# Patient Record
Sex: Female | Born: 1935
Health system: Southern US, Community
[De-identification: ages and names within clinical notes are randomized; demographics above are authoritative.]

## PROBLEM LIST (undated history)

## (undated) DIAGNOSIS — I10 Essential (primary) hypertension: Secondary | ICD-10-CM

## (undated) DIAGNOSIS — I639 Cerebral infarction, unspecified: Secondary | ICD-10-CM

## (undated) DIAGNOSIS — E119 Type 2 diabetes mellitus without complications: Secondary | ICD-10-CM

## (undated) DIAGNOSIS — E785 Hyperlipidemia, unspecified: Secondary | ICD-10-CM

## (undated) DIAGNOSIS — K30 Functional dyspepsia: Secondary | ICD-10-CM

## (undated) DIAGNOSIS — I63422 Cerebral infarction due to embolism of left anterior cerebral artery: Secondary | ICD-10-CM

## (undated) DIAGNOSIS — R55 Syncope and collapse: Secondary | ICD-10-CM

## (undated) DIAGNOSIS — F419 Anxiety disorder, unspecified: Secondary | ICD-10-CM

## (undated) DIAGNOSIS — F0390 Unspecified dementia without behavioral disturbance: Secondary | ICD-10-CM

## (undated) DIAGNOSIS — R42 Dizziness and giddiness: Secondary | ICD-10-CM

## (undated) DIAGNOSIS — R413 Other amnesia: Secondary | ICD-10-CM

## (undated) DIAGNOSIS — J449 Chronic obstructive pulmonary disease, unspecified: Secondary | ICD-10-CM

## (undated) DIAGNOSIS — D649 Anemia, unspecified: Secondary | ICD-10-CM

## (undated) HISTORY — DX: Cerebral infarction, unspecified: I63.9

## (undated) HISTORY — PX: ABDOMINAL HYSTERECTOMY: SHX81

## (undated) HISTORY — DX: Cerebral infarction due to embolism of left anterior cerebral artery: I63.422

## (undated) HISTORY — DX: Type 2 diabetes mellitus without complications: E11.9

## (undated) HISTORY — DX: Hyperlipidemia, unspecified: E78.5

## (undated) HISTORY — DX: Syncope and collapse: R55

## (undated) HISTORY — DX: Dizziness and giddiness: R42

## (undated) HISTORY — DX: Essential (primary) hypertension: I10

## (undated) HISTORY — PX: CAROTID ARTERY - SUBCLAVIAN ARTERY BYPASS GRAFT: SUR178

---

## 2004-11-02 ENCOUNTER — Inpatient Hospital Stay: Payer: Self-pay | Admitting: Internal Medicine

## 2004-11-02 ENCOUNTER — Other Ambulatory Visit: Payer: Self-pay

## 2004-11-03 ENCOUNTER — Other Ambulatory Visit: Payer: Self-pay

## 2006-01-28 ENCOUNTER — Ambulatory Visit: Payer: Self-pay | Admitting: Family Medicine

## 2007-05-05 ENCOUNTER — Ambulatory Visit: Payer: Self-pay | Admitting: Family Medicine

## 2008-05-09 ENCOUNTER — Ambulatory Visit: Payer: Self-pay | Admitting: Family Medicine

## 2009-02-08 ENCOUNTER — Emergency Department: Payer: Self-pay | Admitting: Emergency Medicine

## 2009-02-22 ENCOUNTER — Ambulatory Visit: Payer: Self-pay | Admitting: Physician Assistant

## 2009-05-29 ENCOUNTER — Ambulatory Visit: Payer: Self-pay | Admitting: Family Medicine

## 2010-07-12 ENCOUNTER — Ambulatory Visit: Payer: Self-pay | Admitting: Family Medicine

## 2012-06-09 DIAGNOSIS — F17200 Nicotine dependence, unspecified, uncomplicated: Secondary | ICD-10-CM | POA: Insufficient documentation

## 2012-06-10 DIAGNOSIS — R911 Solitary pulmonary nodule: Secondary | ICD-10-CM | POA: Insufficient documentation

## 2014-02-11 ENCOUNTER — Ambulatory Visit: Payer: Self-pay | Admitting: Hematology and Oncology

## 2014-02-11 DIAGNOSIS — R011 Cardiac murmur, unspecified: Secondary | ICD-10-CM | POA: Insufficient documentation

## 2014-02-11 DIAGNOSIS — R0989 Other specified symptoms and signs involving the circulatory and respiratory systems: Secondary | ICD-10-CM | POA: Insufficient documentation

## 2014-02-11 DIAGNOSIS — R000 Tachycardia, unspecified: Secondary | ICD-10-CM | POA: Insufficient documentation

## 2014-02-11 LAB — CBC CANCER CENTER
BASOS PCT: 1.1 %
Basophil #: 0 x10 3/mm (ref 0.0–0.1)
Eosinophil #: 0 x10 3/mm (ref 0.0–0.7)
Eosinophil %: 0.3 %
HCT: 44.3 % (ref 35.0–47.0)
HGB: 14.4 g/dL (ref 12.0–16.0)
LYMPHS ABS: 1.1 x10 3/mm (ref 1.0–3.6)
Lymphocyte %: 29.6 %
MCH: 29.4 pg (ref 26.0–34.0)
MCHC: 32.5 g/dL (ref 32.0–36.0)
MCV: 91 fL (ref 80–100)
MONO ABS: 0.6 x10 3/mm (ref 0.2–0.9)
MONOS PCT: 15.5 %
Neutrophil #: 2 x10 3/mm (ref 1.4–6.5)
Neutrophil %: 53.5 %
PLATELETS: 198 x10 3/mm (ref 150–440)
RBC: 4.89 10*6/uL (ref 3.80–5.20)
RDW: 13.6 % (ref 11.5–14.5)
WBC: 3.7 x10 3/mm (ref 3.6–11.0)

## 2014-02-11 LAB — IRON AND TIBC
IRON: 53 ug/dL (ref 50–170)
Iron Bind.Cap.(Total): 396 ug/dL (ref 250–450)
Iron Saturation: 13 %
Unbound Iron-Bind.Cap.: 343 ug/dL

## 2014-02-11 LAB — FERRITIN: FERRITIN (ARMC): 32 ng/mL (ref 8–388)

## 2014-02-12 DIAGNOSIS — E0590 Thyrotoxicosis, unspecified without thyrotoxic crisis or storm: Secondary | ICD-10-CM | POA: Insufficient documentation

## 2014-02-12 DIAGNOSIS — D751 Secondary polycythemia: Secondary | ICD-10-CM | POA: Insufficient documentation

## 2014-02-28 ENCOUNTER — Ambulatory Visit: Payer: Self-pay | Admitting: Hematology and Oncology

## 2014-10-02 ENCOUNTER — Emergency Department: Payer: Self-pay | Admitting: Emergency Medicine

## 2014-10-02 LAB — CBC WITH DIFFERENTIAL/PLATELET
BASOS ABS: 0.1 10*3/uL (ref 0.0–0.1)
Basophil %: 1.1 %
Eosinophil #: 0.1 10*3/uL (ref 0.0–0.7)
Eosinophil %: 1.1 %
HCT: 42 % (ref 35.0–47.0)
HGB: 13.7 g/dL (ref 12.0–16.0)
Lymphocyte #: 1.5 10*3/uL (ref 1.0–3.6)
Lymphocyte %: 24.5 %
MCH: 29.3 pg (ref 26.0–34.0)
MCHC: 32.5 g/dL (ref 32.0–36.0)
MCV: 90 fL (ref 80–100)
MONOS PCT: 5.7 %
Monocyte #: 0.3 x10 3/mm (ref 0.2–0.9)
NEUTROS ABS: 4.2 10*3/uL (ref 1.4–6.5)
Neutrophil %: 67.6 %
Platelet: 249 10*3/uL (ref 150–440)
RBC: 4.66 10*6/uL (ref 3.80–5.20)
RDW: 14.4 % (ref 11.5–14.5)
WBC: 6.1 10*3/uL (ref 3.6–11.0)

## 2014-10-02 LAB — URINALYSIS, COMPLETE
BILIRUBIN, UR: NEGATIVE
BLOOD: NEGATIVE
GLUCOSE, UR: NEGATIVE mg/dL (ref 0–75)
Ketone: NEGATIVE
Leukocyte Esterase: NEGATIVE
Nitrite: NEGATIVE
Ph: 5 (ref 4.5–8.0)
Protein: NEGATIVE
RBC,UR: <1 /HPF (ref 0–5)
SPECIFIC GRAVITY: 1.017 (ref 1.003–1.030)
Squamous Epithelial: 3
WBC UR: 2 /HPF (ref 0–5)

## 2014-10-02 LAB — BASIC METABOLIC PANEL
Anion Gap: 7 (ref 7–16)
BUN: 16 mg/dL (ref 7–18)
CALCIUM: 9.1 mg/dL (ref 8.5–10.1)
CO2: 28 mmol/L (ref 21–32)
Chloride: 104 mmol/L (ref 98–107)
Creatinine: 1.09 mg/dL (ref 0.60–1.30)
EGFR (Non-African Amer.): 52 — ABNORMAL LOW
Glucose: 133 mg/dL — ABNORMAL HIGH (ref 65–99)
Osmolality: 281 (ref 275–301)
Potassium: 3.5 mmol/L (ref 3.5–5.1)
Sodium: 139 mmol/L (ref 136–145)

## 2014-10-02 LAB — TROPONIN I: Troponin-I: <0.02 ng/mL

## 2014-12-19 ENCOUNTER — Observation Stay: Payer: Self-pay | Admitting: Internal Medicine

## 2014-12-20 DIAGNOSIS — I340 Nonrheumatic mitral (valve) insufficiency: Secondary | ICD-10-CM

## 2014-12-29 ENCOUNTER — Ambulatory Visit
Admit: 2014-12-29 | Disposition: A | Discharge status: Home / Self Care | Payer: Self-pay | Attending: Otolaryngology | Admitting: Otolaryngology

## 2015-01-03 DIAGNOSIS — F17290 Nicotine dependence, other tobacco product, uncomplicated: Secondary | ICD-10-CM | POA: Diagnosis not present

## 2015-01-03 DIAGNOSIS — I10 Essential (primary) hypertension: Secondary | ICD-10-CM | POA: Diagnosis not present

## 2015-01-03 DIAGNOSIS — I6523 Occlusion and stenosis of bilateral carotid arteries: Secondary | ICD-10-CM | POA: Diagnosis not present

## 2015-01-03 DIAGNOSIS — J432 Centrilobular emphysema: Secondary | ICD-10-CM | POA: Diagnosis not present

## 2015-01-03 DIAGNOSIS — R42 Dizziness and giddiness: Secondary | ICD-10-CM | POA: Diagnosis not present

## 2015-01-05 DIAGNOSIS — I6529 Occlusion and stenosis of unspecified carotid artery: Secondary | ICD-10-CM | POA: Diagnosis not present

## 2015-01-05 DIAGNOSIS — I10 Essential (primary) hypertension: Secondary | ICD-10-CM | POA: Diagnosis not present

## 2015-01-05 DIAGNOSIS — I739 Peripheral vascular disease, unspecified: Secondary | ICD-10-CM | POA: Diagnosis not present

## 2015-01-05 DIAGNOSIS — R279 Unspecified lack of coordination: Secondary | ICD-10-CM | POA: Diagnosis not present

## 2015-01-05 DIAGNOSIS — E785 Hyperlipidemia, unspecified: Secondary | ICD-10-CM | POA: Diagnosis not present

## 2015-01-11 DIAGNOSIS — Z01818 Encounter for other preprocedural examination: Secondary | ICD-10-CM | POA: Diagnosis not present

## 2015-01-11 DIAGNOSIS — I10 Essential (primary) hypertension: Secondary | ICD-10-CM | POA: Diagnosis not present

## 2015-01-11 DIAGNOSIS — E780 Pure hypercholesterolemia: Secondary | ICD-10-CM | POA: Diagnosis not present

## 2015-01-11 DIAGNOSIS — R011 Cardiac murmur, unspecified: Secondary | ICD-10-CM | POA: Diagnosis not present

## 2015-01-20 DIAGNOSIS — R011 Cardiac murmur, unspecified: Secondary | ICD-10-CM | POA: Diagnosis not present

## 2015-01-23 LAB — SURGICAL PATHOLOGY

## 2015-01-24 DIAGNOSIS — R0789 Other chest pain: Secondary | ICD-10-CM | POA: Diagnosis not present

## 2015-01-24 DIAGNOSIS — Z01818 Encounter for other preprocedural examination: Secondary | ICD-10-CM | POA: Diagnosis not present

## 2015-01-29 NOTE — Discharge Summary (Signed)
PATIENT NAME:  Jasmine Arias, Jasmine Arias MR#:  [id number] DATE OF BIRTH:  Dec 24, 1934  DATE OF ADMISSION:  12/19/2014 DATE OF DISCHARGE:  12/20/2014  ADMITTING DIAGNOSIS: Vertigo.  DISCHARGE DIAGNOSES:  1.  Malignant essential hypertension.  2.  Nausea, vomiting, resolved.  3.  Cardiac murmur, benign.  4.  Bilateral carotid artery stenosis.  5.  Syncope. 6.  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   7.  History of bipolar disorder, hypertension, hyperlipidemia, diabetes mellitus, dementia, as well as ongoing tobacco abuse.   DISCHARGE CONDITION: Stable.   DISCHARGE MEDICATIONS: The patient is to continue:  1.  Atorvastatin 10 mg p.o. daily at bedtime.  2.  Glimepiride 2 mg p.o. daily.  3.  Sertraline 50 mg p.o. daily. 4.  Bupropion extended release 150 mg twice daily.  5.  HCTZ/triamterene 25/37.5 mg once daily.  6.  Aspirin 325 mg p.o. daily, this a new medication.  7.  Magnesium oxide 400 mg twice daily.  8.  Omeprazole 20 mg p.o. daily.  9.  Nicotine transdermal patch 21 mg topically daily.   The patient is not to take metformin for the next 48 hours after CTA, which was performed on 12/25/2014.   HOME OXYCONTIN: None.   DIET: Two gram salt, low-fat, low-cholesterol, carbohydrate -controlled diet, regular consistency.   ACTIVITY LIMITATIONS: As tolerated.    FOLLOWUP APPOINTMENT: With ENT MD as soon as possible for left parotid tumor evaluation. Dr. Delana Meyer in 2 days after discharge, Dr. Calla Kicks in 2 days after discharge.   CONSULTANTS: Care management, social work, Dr. Delana Meyer.   RADIOLOGIC STUDIES: Chest x-ray, portable single view, on 12/19/2014, showed no active disease. CT head without contrast, 12/19/2014, showed atrophy and small vessel chronic ischemic changes of deep cerebral white matter but no acute intracranial abnormalities noted. CTA angiography of the carotid arteries, 12/20/2014, revealed extensive atherosclerotic disease, throughout the neck, 80% stenosis in the  distal left common carotid and proximal left internal carotid arteries, 60% stenosis of the distal right common carotid and proximal internal carotid artery, mild to moderate right vertebral artery V2 stenosis, approximately 50% stenosis of the brachiocephalic in the right subclavian artery origin, and 65% left subclavian artery origin stenosis, 4.6 cm left parotid mass compatible with primary parotid neoplasm, 5 mm right parotid nodule. Carotid ultrasound, 12/25/2014, revealing bilateral carotid bifurcation and proximal RCA plaque resulting in 50%-69% diameter right ICA stenosis, 70%-99% diameter left ICA stenosis. Consider vascular, surgical, or interventional radiology consultation. Echocardiogram, 12/21/2014, showed left ventricular ejection fraction by visual estimation 60%-65%, normal global left ventricular systolic function, impaired relaxation pattern of left ventricular diastolic filling, mild left ventricular hypertrophy, normal right ventricular size and systolic function, mild mitral valve regurgitation, mild to moderate tricuspid regurgitation, mildly elevated pulmonary arterial systolic pressure.   HOSPITAL COURSE: The patient is a 80 year old African American female with past medical history significant for history of diabetes, hypertension, hyperlipidemia, who presents to the hospital with dizziness as well as presyncope. Apparently, the patient was having problems with nausea, vomiting, multiple bouts of vomiting and came to the hospital with dizziness as well as feeling presyncopal. She went to see her primary care physician and tried to stand up and felt presyncopal, went down to the floor, but did not completely pass out. She did not hit her head, but felt very weak and was sent Emergency Room for further evaluation. In the Emergency Room, she continued to have nausea but no vomiting. Orthostatic vital signs were checked, were found to be positive  and she was admitted to the hospital for  further evaluation. In the Emergency Room, her temperature was 97.6, pulse was 88, respiration rate was 18-20, blood pressure 195/96, saturation 100% on room air. Physical exam revealed cardiac murmur also carotid murmur and the patient was admitted to the hospital for further evaluation. Her labs done in the Emergency Room showed a glucose level of 244, otherwise BMP was unremarkable. The patient's magnesium level was found to be low at 1.5. Liver enzymes were normal. Troponin was less than 0.03. CBC within normal limits with white blood cell count of 10.2, hemoglobin 13.9 platelet count was 204,000. Urinalysis showed straw clear urine, 150 mg/dL glucose, negative for bilirubin or ketones, specific gravity was 1.012, pH was 7.0, negative for blood, 30 mg/dL protein, negative for nitrites or leukocyte esterase, 2 red blood cells, 1 white blood cell, no bacteria, less than 1 epithelial cell. EKG showed sinus rhythm at 85 beats per minute, sinus arrhythmia with frequent premature ventricular complexes, possible left atrial enlargement according to EKG criteria and no acute ST-T changes were noted. The patient was admitted to the hospital for further evaluation because of her presyncopal episodes and concerns of accelerated hypertension. She was rehydrated and a carotid ultrasound as well as echocardiogram were ordered. Carotid ultrasound revealed significant carotid artery disease. At this point, the patient underwent CTA of her neck, which showed extensive carotid artery disease as well as brachiocephalic and left subclavian artery stenosis and left parotid mass of approximately 4.6 cm. Because of extensive artery disease, the patient was seen by Dr. Delana Meyer on 12/20/2014. Dr. Delana Meyer felt that the patient does have critical stenosis in left ICA; however, he felt that she is aq poor stent candidate given the likely primary parotid malignancy. Because of that, he recommended to evaluate the patient by ENT so  appropriate timing can be made for evaluation. The patient was initiated on aspirin and recommended to follow up with Dr. Delana Meyer as well as ENT physician as soon as possible. She was ambulated while she was in the hospital and recommended to send her home no home health physical therapy recommendations were made by physical therapist.  In regard to nausea and vomiting, the patient was rehydrated, continued on antiemetics and she had no further episodes of nausea, vomiting.   In regard to cardiac murmur, echocardiogram was performed and did not reveal significant stenosis, although the patient was shown to have mild mitral valve regurgitation as well as mild to moderate tricuspid as well as mild LVH as well as impaired relaxation pattern of left ventricular diastolic filling, but normal ejection fraction. No further recommendations in regard to cardiac evaluation where made.   For tobacco abuse, the patient was counseled and recommended to quit. Nicotine replacement therapy was initiated for her upon discharge.   For her chronic medical problems such as bipolar disorder, hypertension, hyperlipidemia, diabetes mellitus, dementia, the patient is to continue her outpatient medications except of metformin. The patient's metformin suspended for 48 hours after CTA, which was performed on 12/20/2014.   The patient is being discharged in stable condition with the above-mentioned medications and followup. On the day of discharge, temperature was 98.0, pulse was 86, respiration rate was 18, blood pressure ranging from 242-683 systolic and 41D diastolic, oxygen saturations were 94%-96% on room air at rest.   TIME SPENT: 40 minutes.    ____________________________ Theodoro Grist, MD rv:bm D: 12/20/2014 17:58:17 ET T: 12/21/2014 05:05:01 ET JOB#: [id number]  cc: Theodoro Grist, MD, <Dictator> ENT  MD Katha Cabal, MD Dory Horn. Eliberto Ivory, MD  Theodoro Grist MD ELECTRONICALLY SIGNED 01/08/2015 14:34

## 2015-01-29 NOTE — Consult Note (Signed)
Brief Consult Note: Diagnosis: left ICA critical stenosis,.   Recommend further assessment or treatment.   Comments: Patient does have critical stenosis of the left ICA however she is a poor stent candidate and given the finding of a primary parotid malignancy open surgical repair will be very complicated.  Patient needs evaluation by ENT so that appropriate planning can be made.  Electronic Signatures: Hortencia Pilar (MD)  (Signed 22-Mar-16 17:28)  Authored: Brief Consult Note   Last Updated: 22-Mar-16 17:28 by Hortencia Pilar (MD)

## 2015-01-29 NOTE — H&P (Signed)
PATIENT NAME:  Jasmine Arias, Jasmine Arias MR#:  [id number] DATE OF BIRTH:  1935-09-16  DATE OF ADMISSION:  12/19/2014  PRIMARY CARE PROVIDER: Dr. Wynetta Emery   CHIEF COMPLAINT: Dizziness and presyncope.   HISTORY OF PRESENTING ILLNESS: A 80 year old, African American female patient with history of hypertension, diabetes, dementia, bipolar disorder was feeling well earlier today. But around 2:30 p.m. she started having some nausea and multiple bouts of vomiting. No blood. No abdominal pain. No diarrhea. She had a normal bowel movement earlier today. She went to see her primary care physician where she tried to stand up, had an episode of presyncope and went down to the floor but did not pass out, did not hit her head, felt extremely weak and was sent to the Emergency Room thru EMS. Here patient continued to have nausea but no vomiting. But considering her dehydration, orthostatic vitals being positive, ongoing dizziness, and dehydration is being admitted to the hospitalist service. The patient also had elevated blood pressure into the 180s.   History has been obtained from the patient and family at bedside.   The patient has not been eating or drinking well over the past few months according to her niece and sister at bedside and has been noncompliant with medications. She normally ambulates on her own. Does not have any dizziness, nausea. The last medication that was started was bupropion 2 months prior. Here, patient had a CT scan of the head, which showed nothing acute. Chest x-ray is normal.   PAST MEDICAL HISTORY:  1.  Bipolar disorder.  2.  Dementia.  3.  Hypertension.  4.  Diabetes mellitus.  5.  Total hysterectomy.  6.  Hyperlipidemia.   FAMILY HISTORY: CVA, hypertension.   SOCIAL HISTORY: The patient continues to smoke. No alcohol. No illicit drugs. Lives at home with her husband.   ALLERGIES: No known drug allergies.   REVIEW OF SYSTEMS:  CONSTITUTIONAL: Complains of fatigue, weakness.  EYES:  No blurred vision, pain, redness.  EARS, NOSE, AND THROAT: No tinnitus, ear pain, hearing loss.  RESPIRATORY: No cough, wheeze, hemoptysis.  CARDIOVASCULAR: No chest pain, orthopnea, edema.  GASTROINTESTINAL: Has nausea, vomiting earlier today, which has resolved. No abdominal pain, diarrhea.  GENITOURINARY: No dysuria, hematuria, frequency.  ENDOCRINE: No polyuria, nocturia, thyroid problems.  HEMATOLOGIC AND LYMPHATIC: No anemia, easy bruising.  INTEGUMENTARY: No acne, rash, lesion.  MUSCULOSKELETAL: No back pain, arthritis.  NEUROLOGICAL: Has dizziness.  PSYCHIATRIC: Has bipolar disorder.   HOME MEDICATIONS:  1.  Metformin 500 mg 2 times a day.  2.  Sertraline 50 mg oral daily at bedtime.  3.  Bupropion 150 mg 2 times a day.  4.  Glimepiride 2 mg in the morning.  5.  Hydrochlorothiazide/triamterene 25/37.5 mg oral once a day.  6.  Atorvastatin 20 mg daily.   PHYSICAL EXAMINATION:  VITAL SIGNS: Temperature 97.6, pulse of 88, blood pressure 195/96, saturating 100% on room air.  GENERAL: Moderately built [place] American female patient lying in bed in distress secondary to her ongoing dizziness.  PSYCHIATRIC: Alert and oriented x 3, anxious.  HEENT: Atraumatic. Normocephalic. Oral mucosa dry and pink.  no pallor, no icterus.  NECK: Supple. No thyromegaly. No palpable lymph nodes. Trachea midline. No carotid bruit or JVD.  CARDIOVASCULAR: S1, S2, without any murmurs. Peripheral pulses 2+. No edema.  RESPIRATORY: Normal work of breathing. Clear to auscultation on both sides.  GASTROINTESTINAL: Soft abdomen, nontender. Bowel sounds present. No organomegaly palpable.  SKIN: Warm and dry. No petechiae, rash, ulcer.  MUSCULOSKELETAL: No joint swelling, redness, effusion of the large joints. Normal muscle tone.  NEUROLOGICAL: Motor strength 5/5 in upper and lower extremities. Sensation to fine touch intact all over. Cranial nerves II through XII intact . No nystagmus. Finger-nose test  normal. Gait not tested as patient got dizzy on sitting up.    LABORATORY DATA: EKG shows normal sinus rhythm with frequent PVCs.   CT scan of the head shows some mild atrophy, nothing acute.   Chest x-ray shows nothing acute.   ASSESSMENT AND PLAN:  1.  Vertigo.  The patient does not  have any neurological deficits. CT scan does not show any stroke. At this point, the patient is severely dehydrated due to her vomiting, decreased intake. We will start her on IV fluids and give her normal saline 1000 mL bolus to start. We will start her on scheduled meclizine. We will also have physical therapy see the patient for her vertigo symptoms. If patient's vertigo does not improve, she may need an MRI of the brain to rule out posterior circulation stroke due to her noncompliance with medications, uncontrolled diabetes and hypertension. Further management as per patient's progress on conservative treatment.  2.  Accelerated hypertension due to noncompliance with medications. We will use IV p.r.n. medications along with restarting her home medications and trend the blood pressure.  3.  Diabetes mellitus type 2. Put her on sliding scale insulin and restart her glimepiride, which she takes in the morning and metformin.  4.  Tobacco abuse. Counseled the patient to quit smoking for greater than 3 minutes.  5.  Early dementia. Watch for any inpatient delirium.  6.  Deep vein thrombosis prophylaxis with Lovenox.   CODE STATUS: Full code.   TIME SPENT ON THIS CASE: 45 minutes.    ____________________________ Leia Alf Kaiyah Eber, MD srs:AT D: 12/19/2014 23:12:38 ET T: 12/19/2014 23:26:36 ET JOB#: [id number]  cc: Alveta Heimlich R. Marlon Suleiman, MD, <Dictator> Neita Carp MD ELECTRONICALLY SIGNED 12/20/2014 2:25

## 2015-02-08 DIAGNOSIS — R011 Cardiac murmur, unspecified: Secondary | ICD-10-CM | POA: Diagnosis not present

## 2015-02-08 DIAGNOSIS — E780 Pure hypercholesterolemia: Secondary | ICD-10-CM | POA: Diagnosis not present

## 2015-02-08 DIAGNOSIS — I10 Essential (primary) hypertension: Secondary | ICD-10-CM | POA: Diagnosis not present

## 2015-02-08 DIAGNOSIS — R0989 Other specified symptoms and signs involving the circulatory and respiratory systems: Secondary | ICD-10-CM | POA: Diagnosis not present

## 2015-03-02 DIAGNOSIS — I6529 Occlusion and stenosis of unspecified carotid artery: Secondary | ICD-10-CM | POA: Diagnosis not present

## 2015-03-02 DIAGNOSIS — I10 Essential (primary) hypertension: Secondary | ICD-10-CM | POA: Diagnosis not present

## 2015-03-02 DIAGNOSIS — E785 Hyperlipidemia, unspecified: Secondary | ICD-10-CM | POA: Diagnosis not present

## 2015-03-09 ENCOUNTER — Inpatient Hospital Stay: Admission: RE | Admit: 2015-03-09 | Payer: Medicare Other | Source: Ambulatory Visit

## 2015-03-17 ENCOUNTER — Inpatient Hospital Stay: Admit: 2015-03-17 | Payer: Self-pay | Admitting: Vascular Surgery

## 2015-03-17 SURGERY — ENDARTERECTOMY, CAROTID
Anesthesia: Choice | Laterality: Left

## 2015-05-11 ENCOUNTER — Other Ambulatory Visit: Payer: Self-pay | Admitting: Family Medicine

## 2015-05-11 ENCOUNTER — Encounter: Payer: Self-pay | Admitting: Family Medicine

## 2015-05-11 ENCOUNTER — Ambulatory Visit (INDEPENDENT_AMBULATORY_CARE_PROVIDER_SITE_OTHER): Payer: Medicare Other | Admitting: Family Medicine

## 2015-05-11 VITALS — BP 120/77 | HR 102 | Temp 98.5°F | Ht 60.4 in | Wt 111.8 lb

## 2015-05-11 DIAGNOSIS — E042 Nontoxic multinodular goiter: Secondary | ICD-10-CM | POA: Diagnosis not present

## 2015-05-11 DIAGNOSIS — I6522 Occlusion and stenosis of left carotid artery: Secondary | ICD-10-CM

## 2015-05-11 DIAGNOSIS — E113299 Type 2 diabetes mellitus with mild nonproliferative diabetic retinopathy without macular edema, unspecified eye: Secondary | ICD-10-CM | POA: Insufficient documentation

## 2015-05-11 DIAGNOSIS — E119 Type 2 diabetes mellitus without complications: Secondary | ICD-10-CM

## 2015-05-11 DIAGNOSIS — E46 Unspecified protein-calorie malnutrition: Secondary | ICD-10-CM

## 2015-05-11 DIAGNOSIS — F0390 Unspecified dementia without behavioral disturbance: Secondary | ICD-10-CM | POA: Diagnosis not present

## 2015-05-11 DIAGNOSIS — E11319 Type 2 diabetes mellitus with unspecified diabetic retinopathy without macular edema: Secondary | ICD-10-CM | POA: Diagnosis not present

## 2015-05-11 DIAGNOSIS — I6529 Occlusion and stenosis of unspecified carotid artery: Secondary | ICD-10-CM | POA: Insufficient documentation

## 2015-05-11 DIAGNOSIS — R42 Dizziness and giddiness: Secondary | ICD-10-CM

## 2015-05-11 DIAGNOSIS — I10 Essential (primary) hypertension: Secondary | ICD-10-CM

## 2015-05-11 DIAGNOSIS — E785 Hyperlipidemia, unspecified: Secondary | ICD-10-CM

## 2015-05-11 DIAGNOSIS — I700 Atherosclerosis of aorta: Secondary | ICD-10-CM | POA: Insufficient documentation

## 2015-05-11 HISTORY — DX: Dizziness and giddiness: R42

## 2015-05-11 LAB — MICROALBUMIN, URINE WAIVED
Creatinine, Urine Waived: 200 mg/dL (ref 10–300)
MICROALB, UR WAIVED: 30 mg/L — AB (ref 0–19)
Microalb/Creat Ratio: <30 mg/g (ref <30)

## 2015-05-11 LAB — CBC WITH DIFFERENTIAL/PLATELET
HEMATOCRIT: 43.6 % (ref 34.0–46.6)
HEMOGLOBIN: 14.7 g/dL (ref 11.1–15.9)
LYMPHS: 28 %
Lymphocytes Absolute: 1.9 10*3/uL (ref 0.7–3.1)
MCH: 30.6 pg (ref 26.6–33.0)
MCHC: 33.7 g/dL (ref 31.5–35.7)
MCV: 91 fL (ref 79–97)
MID (Absolute): 0.5 10*3/uL (ref 0.1–1.6)
MID: 7 %
Neutrophils Absolute: 4.5 10*3/uL (ref 1.4–7.0)
Neutrophils: 65 %
Platelets: 236 10*3/uL (ref 150–379)
RBC: 4.81 x10E6/uL (ref 3.77–5.28)
RDW: 13.8 % (ref 12.3–15.4)
WBC: 6.9 10*3/uL (ref 3.4–10.8)

## 2015-05-11 LAB — UA/M W/RFLX CULTURE, ROUTINE
Bilirubin, UA: NEGATIVE
Glucose, UA: NEGATIVE
Ketones, UA: NEGATIVE
LEUKOCYTES UA: NEGATIVE
NITRITE UA: NEGATIVE
Protein, UA: NEGATIVE
RBC, UA: NEGATIVE
Specific Gravity, UA: 1.015 (ref 1.005–1.030)
UUROB: 1 mg/dL (ref 0.2–1.0)
pH, UA: 5.5 (ref 5.0–7.5)

## 2015-05-11 LAB — BAYER DCA HB A1C WAIVED: HB A1C (BAYER DCA - WAIVED): 6.4 % (ref <7.0)

## 2015-05-11 LAB — LIPID PANEL PICCOLO, WAIVED
CHOL/HDL RATIO PICCOLO,WAIVE: 2.8 mg/dL
CHOLESTEROL PICCOLO, WAIVED: 144 mg/dL (ref <200)
HDL CHOL PICCOLO, WAIVED: 52 mg/dL — AB (ref >59)
LDL CHOL CALC PICCOLO WAIVED: 79 mg/dL (ref <100)
Triglycerides Piccolo,Waived: 69 mg/dL (ref <150)
VLDL Chol Calc Piccolo,Waive: 14 mg/dL (ref <30)

## 2015-05-11 MED ORDER — DONEPEZIL HCL 10 MG PO TABS: 10.0000 mg | ORAL_TABLET | Freq: Every day | ORAL | Status: DC

## 2015-05-11 MED ORDER — SERTRALINE HCL 50 MG PO TABS: 50.0000 mg | ORAL_TABLET | Freq: Every day | ORAL | Status: DC

## 2015-05-11 MED ORDER — ENSURE NUTRITION SHAKE PO LIQD: 8.0000 [oz_av] | Freq: Two times a day (BID) | ORAL | Status: DC

## 2015-05-11 NOTE — Assessment & Plan Note (Signed)
MMSE has gotten significantly worse since her last one. In December at her previous doctor, her MMSE was a 23/30. Today, MMSE was 14.5/30. Discussed with patient. And her husband that this seems like it's getting worse. Will start zoloft again in case it's pseudo-dementia. Will start aricept. Will have her follow up in 1 month. Will have her see neurology- referral to Dr. Manuella Ghazi and appointment made today.

## 2015-05-11 NOTE — Assessment & Plan Note (Signed)
Rechecking TSH today, was subclinical hyperthyroid previously. Await results.

## 2015-05-11 NOTE — Patient Instructions (Addendum)
Appointment with the neurologist:  [facility]  [place]  [place] [phone] Dr. Manuella Ghazi June 30, 2015 11AM  Dementia Dementia is a word that is used to describe problems with the brain and how it works. People with dementia have memory loss. They may also have problems with thinking, speaking, or solving problems. It can affect how they act around people, how they do their job, their mood, and their personality. These changes may not show up for a long time. Family or friends may not notice problems in the early part of this disease. HOME CARE The following tips are for the person living with, or caring for, the person with dementia. Make the home safe.  Remove locks on bathroom doors.  Use childproof locks on cabinets where alcohol, cleaning supplies, or chemicals are stored.  Put outlet covers in electrical outlets.  Put in childproof locks to keep doors and windows safe.  Remove stove knobs, or put in safety knobs that shut off on their own.  Lower the temperature on water heaters.  Label medicines. Lock them in a safe place.  Keep knives, lighters, matches, power tools, and guns out of reach or in a safe place.  Remove objects that might break or can hurt the person.  Make sure lighting is good inside and outside.  Put in grab bars if needed.  Use a device that detects falls or other needs for help. Lessen confusion.  Keep familiar objects and people around.  Use night lights or low lit (dim) lights at night.  Label objects or areas.  Use reminders, notes, or directions for daily activities or tasks.  Keep a simple routine that is the same for waking, meals, bathing, dressing, and bedtime.  Create a calm and quiet home.  Put up clocks and calendars.  Keep emergency numbers and the home address near all phones.  Help show the different times of day. Open the curtains during the day to let light in. Speak clearly and  directly.  Choose simple words and short sentences.  Use a gentle, calm voice.  Do not interrupt.  If the person has a hard time finding a word to use, give them the word or thought.  Ask 1 question at a time. Give enough time for the person to answer. Repeat the question if the person does not answer. Do things that lessen restlessness.  Provide a comfortable bed.  Have the same bedtime routine every night.  Have a regular walking and activity schedule.  Lessen naps during the day.  Do not let the person drink a lot of caffeine.  Go to events that are not overwhelming. Eat well and drink fluids.  Lessen distractions during meal times and snacks.  Avoid foods that are too hot or too cold.  Watch how the person chews and swallows. This is to make sure they do not choke. Other  Keep all vision, hearing, dental, and medical visits with the doctor.  Only give medicines as told by the doctor.  Watch the person's driving ability. Do not let the person drive if he or she cannot drive safely.  Use a program that helps find a person if they become missing. You may need to register with this program. GET HELP RIGHT AWAY IF:   A fever of 102 F (38.9 C) develops.  Confusion develops or gets worse.  Sleepiness develops or gets worse.  Staying awake is hard to do.  New behavior problems start like mood swings,  aggression, and seeing things that are not there.  Problems with balance, speech, or falling develop.  Problems swallowing develop.  Any problems of another sickness develop. MAKE SURE YOU:  Understand these instructions.  Will watch his or her condition.  Will get help right away if he or she is not doing well or gets worse. Document Released: 08/29/2008 Document Revised: 12/09/2011 Document Reviewed: 02/11/2011 [facility] Patient Information 2015 [place], [place]. This information is not intended to replace advice given to you by your health care provider.  Make sure you discuss any questions you have with your health care provider.

## 2015-05-11 NOTE — Assessment & Plan Note (Signed)
Does not want to have endarterectomy. Likely the cause of her dizziness. Follow up with vascular as needed.

## 2015-05-11 NOTE — Assessment & Plan Note (Signed)
Under good control. A1c 6.4 today. Continue current regimen. Continue to monitor.

## 2015-05-11 NOTE — Progress Notes (Signed)
BP 120/77 mmHg  Pulse 102  Temp(Src) 98.5 F (36.9 C)  Ht 5' 0.4" (1.534 m)  Wt 111 lb 12.8 oz (50.712 kg)  BMI 21.55 kg/m2  SpO2 96%  LMP  (LMP Unknown)   Subjective:    Patient ID: Jasmine Arias, female    DOB: [birth year], 80 y.o.   MRN: [id number]  HPI: Jasmine Arias is a 80 y.o. female  Chief Complaint  Patient presents with  . Fatigue    patient has been experiencing fatigue,dizziness and confusion.  . Anorexia    patients husband states that she is not eating the way she should.   . Memory Loss    patients husband states that she gets things confused,what is real and not real.   Saw Cardiology in May for clearance for endarterectomy. He stated that she was low risk for surgery from a cardiac standpoint and cleared her to have endarterectomy. Endarterectomy cancelled by Dr. Anselm Pancoast when she did not keep her pre-admission testing in June. Didn't have the surgery because she was scared. Has not seen him again. Does not want to have the surgery done. Is still feeling dizzy.   Had parotid tumor diagnosed in May- was supposed to go see Dr. Tami Ribas from ENT. Had bx done. Tumor benign. No follow up needed.  Per records from previous provider: Had confusion and anxiety going back 6 months ago. Previous PCP suspected dementia and offered aricept, which they declined. Previously had been paranoid and imaging that her husband had other children and a girlfriend that didn't exist. It was unclear if this was clearly delusional. She was getting confused and had loss of short term memory. She felt very anxious at that she was "going to have a nervous breakdown". Was also very fatigued at that time. She was tried on sertraline at that time for possible pseudodementia, but her former PCP suspected cognitive impairment.  1 month following start of sertraline, she felt like she was doing better but her husband felt like she was no different.  He notes that this is what is happening again. She  has been off her medicine for several months and is not feeling like herself. She notes that it is more like anxiety and fatigue. He notes that she is very confused and has trouble telling what is real and what is not. She is frustrated by this and doesn't feel like it is the case.   HYPERTENSION / HYPERLIPIDEMIA Satisfied with current treatment? yes Duration of hypertension: chronic BP monitoring frequency: not checking BP medication side effects: no Duration of hyperlipidemia: chronic Cholesterol medication side effects: no Cholesterol supplements: none Medication compliance: excellent compliance Aspirin: no Recent stressors: no Recurrent headaches: no Visual changes: no Palpitations: no Dyspnea: no Chest pain: no Lower extremity edema: no Dizzy/lightheaded: yes  DIABETES Hypoglycemic episodes:no Polydipsia/polyuria: no Visual disturbance: no Chest pain: no Paresthesias: no Glucose Monitoring: yes Blood Pressure Monitoring: not checking Retinal Examination: Up to Date Foot Exam: Up to Date Diabetic Education: Completed Pneumovax: unknown Influenza: Up to Date Aspirin: yes  Relevant past medical, surgical, family and social history reviewed and updated as indicated. Interim medical history since our last visit reviewed. Allergies and medications reviewed and updated.  Review of Systems  Constitutional: Positive for appetite change, fatigue and unexpected weight change. Negative for fever, chills, diaphoresis and activity change.  Respiratory: Negative.   Cardiovascular: Negative.   Gastrointestinal: Negative.   Skin: Negative.   Neurological: Positive for dizziness and light-headedness. Negative for tremors,  seizures, syncope, facial asymmetry, speech difficulty, weakness, numbness and headaches.  Psychiatric/Behavioral: Positive for confusion, sleep disturbance, dysphoric mood and decreased concentration. Negative for suicidal ideas, hallucinations, behavioral  problems, self-injury and agitation. The patient is nervous/anxious. The patient is not hyperactive.    Per HPI unless specifically indicated above     Objective:    BP 120/77 mmHg  Pulse 102  Temp(Src) 98.5 F (36.9 C)  Ht 5' 0.4" (1.534 m)  Wt 111 lb 12.8 oz (50.712 kg)  BMI 21.55 kg/m2  SpO2 96%  LMP  (LMP Unknown)  Wt Readings from Last 3 Encounters:  05/11/15 111 lb 12.8 oz (50.712 kg)  01/03/15 114 lb (51.71 kg)    Physical Exam  Constitutional: She is oriented to person, place, and time. She appears well-developed and well-nourished. No distress.  HENT:  Head: Normocephalic and atraumatic.  Right Ear: Hearing normal.  Left Ear: Hearing normal.  Nose: Nose normal.  Eyes: Conjunctivae and lids are normal. Right eye exhibits no discharge. Left eye exhibits no discharge. No scleral icterus.  Cardiovascular: Normal rate and regular rhythm.  Exam reveals no gallop and no friction rub.   Murmur heard.  Crescendo systolic murmur is present with a grade of 3/6  Pulmonary/Chest: Effort normal and breath sounds normal. No respiratory distress. She has no wheezes. She has no rales. She exhibits no tenderness.  Musculoskeletal: Normal range of motion.  Neurological: She is alert and oriented to person, place, and time.  Skin: Skin is warm, dry and intact. No rash noted. No erythema. No pallor.  Psychiatric: She has a normal mood and affect. Her speech is normal and behavior is normal. Judgment and thought content normal. Cognition and memory are normal.  Nursing note and vitals reviewed.   Results for orders placed or performed during the hospital encounter of 12/29/14  Surgical pathology  Result Value Ref Range   SURGICAL PATHOLOGY      Surgical Procedure CASE: [id number] PATIENT: [name] Surgical Pathology Report     SPECIMEN SUBMITTED: A. Parotid, left, biopsy  CLINICAL HISTORY: None provided  PRE-OPERATIVE DIAGNOSIS: L Parotid mass  POST-OPERATIVE  DIAGNOSIS: Same/ All cores in saline to allow for Flow     DIAGNOSIS: A. PAROTID GLAND, LEFT; ULTRASOUND GUIDED BIOPSY: - PAPILLARY CYSTADENOMA LYMPHOMATOSUM (WARTHIN'S TUMOR).  Note A portion of tissue is sent for flow cytometry with the following results in summary: No evidence of a monoclonal B cell or aberrant T-cell population detected [employer], Accession [id number], 12/30/14).  GROSS DESCRIPTION:  A. Labeled: Left parotid biopsy, in saline Tissue Fragment(s): Multiple Measurement: Aggregate, 2.1 x 0.2 x 0.1 cm Comment: Tan to pink cores/fragments, touch prep is performed, a portion of one core is submitted in RPMI media for flow cytometry, the remaining tissue is formalin fixed  Entirely submitted in casse tte(s): 1-2, marked green   Final Diagnosis performed by Delorse Lek, MD.  Electronically signed 01/02/2015 9:25:58AM    The electronic signature indicates that the named Attending Pathologist has evaluated the specimen  Technical component performed at [facility], [address], [place], [place] [address] Lab: [phone] Dir: Darrick Penna. Evette Doffing, MD  Professional component performed at [facility], [facility], [place], [address], [place] [address] Lab: [phone] Dir: Dellia Nims. Reuel Derby, MD        Assessment & Plan:   Problem List Items Addressed This Visit      Cardiovascular and Mediastinum   Essential hypertension    Under good control today. Continue current regimen.  Continue to monitor      Carotid stenosis    Does not want to have endarterectomy. Likely the cause of her dizziness. Follow up with vascular as needed.         Endocrine   Type 2 diabetes mellitus with background retinopathy    Under good control. A1c 6.4 today. Continue current regimen. Continue to monitor.       Multiple thyroid nodules    Rechecking TSH today, was subclinical hyperthyroid previously. Await results.        Nervous and Auditory    Dementia - Primary    MMSE has gotten significantly worse since her last one. In December at her previous doctor, her MMSE was a 23/30. Today, MMSE was 14.5/30. Discussed with patient. And her husband that this seems like it's getting worse. Will start zoloft again in case it's pseudo-dementia. Will start aricept. Will have her follow up in 1 month. Will have her see neurology- referral to Dr. Manuella Ghazi and appointment made today.       Relevant Medications   sertraline (ZOLOFT) 50 MG tablet   donepezil (ARICEPT) 10 MG tablet   Other Relevant Orders   Ambulatory referral to Neurology     Other   Malnutrition    Not eating as she is supposed to. Will start ensure shakes. Await CMP.      Relevant Medications   Nutritional Supplements (ENSURE NUTRITION SHAKE) LIQD   Vertigo   HLD (hyperlipidemia)    Under good control. Continue current regimen. Continue to monitor.           Follow up plan: Return in about 4 weeks (around 06/08/2015).

## 2015-05-11 NOTE — Assessment & Plan Note (Signed)
Under good control today. Continue current regimen. Continue to monitor.  

## 2015-05-11 NOTE — Assessment & Plan Note (Signed)
Not eating as she is supposed to. Will start ensure shakes. Await CMP.

## 2015-05-11 NOTE — Assessment & Plan Note (Signed)
Under good control. Continue current regimen. Continue to monitor.  

## 2015-05-12 ENCOUNTER — Encounter: Payer: Self-pay | Admitting: Family Medicine

## 2015-05-12 LAB — COMPREHENSIVE METABOLIC PANEL
ALBUMIN: 4.2 g/dL (ref 3.5–4.8)
ALK PHOS: 97 IU/L (ref 39–117)
ALT: 10 IU/L (ref 0–32)
AST: 15 IU/L (ref 0–40)
Albumin/Globulin Ratio: 1.4 (ref 1.1–2.5)
BUN / CREAT RATIO: 12 (ref 11–26)
BUN: 14 mg/dL (ref 8–27)
Bilirubin Total: 0.3 mg/dL (ref 0.0–1.2)
CHLORIDE: 100 mmol/L (ref 97–108)
CO2: 28 mmol/L (ref 18–29)
CREATININE: 1.19 mg/dL — AB (ref 0.57–1.00)
Calcium: 10 mg/dL (ref 8.7–10.3)
GFR calc non Af Amer: 44 mL/min/{1.73_m2} — ABNORMAL LOW (ref >59)
GFR, EST AFRICAN AMERICAN: 51 mL/min/{1.73_m2} — AB (ref >59)
GLOBULIN, TOTAL: 2.9 g/dL (ref 1.5–4.5)
GLUCOSE: 57 mg/dL — AB (ref 65–99)
Potassium: 3.9 mmol/L (ref 3.5–5.2)
Sodium: 143 mmol/L (ref 134–144)
TOTAL PROTEIN: 7.1 g/dL (ref 6.0–8.5)

## 2015-05-12 LAB — TSH: TSH: 0.638 u[IU]/mL (ref 0.450–4.500)

## 2015-06-12 ENCOUNTER — Encounter: Payer: Self-pay | Admitting: Family Medicine

## 2015-06-12 ENCOUNTER — Ambulatory Visit (INDEPENDENT_AMBULATORY_CARE_PROVIDER_SITE_OTHER): Payer: Medicare Other | Admitting: Family Medicine

## 2015-06-12 ENCOUNTER — Ambulatory Visit
Admission: RE | Admit: 2015-06-12 | Discharge: 2015-06-12 | Disposition: A | Discharge status: Home / Self Care | Payer: Medicare Other | Source: Ambulatory Visit | Attending: Family Medicine | Admitting: Family Medicine

## 2015-06-12 ENCOUNTER — Telehealth: Payer: Self-pay | Admitting: Family Medicine

## 2015-06-12 VITALS — BP 109/61 | HR 116 | Temp 98.8°F | Wt 112.0 lb

## 2015-06-12 DIAGNOSIS — Z23 Encounter for immunization: Secondary | ICD-10-CM

## 2015-06-12 DIAGNOSIS — R918 Other nonspecific abnormal finding of lung field: Secondary | ICD-10-CM | POA: Diagnosis not present

## 2015-06-12 DIAGNOSIS — E11319 Type 2 diabetes mellitus with unspecified diabetic retinopathy without macular edema: Secondary | ICD-10-CM

## 2015-06-12 DIAGNOSIS — F0390 Unspecified dementia without behavioral disturbance: Secondary | ICD-10-CM | POA: Diagnosis not present

## 2015-06-12 DIAGNOSIS — E113299 Type 2 diabetes mellitus with mild nonproliferative diabetic retinopathy without macular edema, unspecified eye: Secondary | ICD-10-CM

## 2015-06-12 DIAGNOSIS — R05 Cough: Secondary | ICD-10-CM

## 2015-06-12 DIAGNOSIS — I10 Essential (primary) hypertension: Secondary | ICD-10-CM

## 2015-06-12 DIAGNOSIS — R062 Wheezing: Secondary | ICD-10-CM | POA: Diagnosis not present

## 2015-06-12 DIAGNOSIS — R059 Cough, unspecified: Secondary | ICD-10-CM

## 2015-06-12 DIAGNOSIS — Z87891 Personal history of nicotine dependence: Secondary | ICD-10-CM | POA: Diagnosis not present

## 2015-06-12 MED ORDER — FLUTICASONE-SALMETEROL 100-50 MCG/DOSE IN AEPB: 1.0000 | INHALATION_SPRAY | Freq: Two times a day (BID) | RESPIRATORY_TRACT | Status: DC

## 2015-06-12 MED ORDER — HYDROCHLOROTHIAZIDE 25 MG PO TABS: 25.0000 mg | ORAL_TABLET | Freq: Every day | ORAL | Status: DC

## 2015-06-12 NOTE — Assessment & Plan Note (Signed)
Last visit 6.4, given the dizziness, will stop her glimepiride and see how she does at recheck.

## 2015-06-12 NOTE — Telephone Encounter (Signed)
Called and spoke to [facility]. COPD on CXR. Will start advair. Sent to her pharmacy. Call with any problems.

## 2015-06-12 NOTE — Assessment & Plan Note (Signed)
BP quite low. Concerning with her dizziness. Will stop her triamterene and continue her on her HCTZ and recheck in 1 month.

## 2015-06-12 NOTE — Progress Notes (Signed)
BP 109/61 mmHg  Pulse 116  Temp(Src) 98.8 F (37.1 C)  Wt 112 lb (50.803 kg)  SpO2 96%  LMP  (LMP Unknown)   Subjective:    Patient ID: Jasmine Arias, female    DOB: 09-16-1935, 80 y.o.   MRN: [id number]  HPI: Jasmine Arias is a 80 y.o. female  Chief Complaint  Patient presents with  . Dementia   Feels like her mood is doing well. Husband notes that she continues the same. No better with her memory. Due to see Dr. Manuella Ghazi on 9/30 for evaluation with him. Notes that she continues to feel dizzy and tired most of the time. Still does not want endarterectomy.   DIZZINESS Duration: chronic Description of symptoms: lightheaded Duration of episode: hours Dizziness frequency: recurrent Provoking factors: none Aggravating factors:  none Triggered by rolling over in bed: no Triggered by bending over: no Aggravated by head movement: no Aggravated by exertion, coughing, loud noises: no Recent head injury: no Recent or current viral symptoms: no History of vasovagal episodes: no Nausea: yes Vomiting: no Tinnitus: no Hearing loss: no Aural fullness: no Headache: no Photophobia/phonophobia: no Unsteady gait: yes Postural instability: no Diplopia, dysarthria, dysphagia or weakness: no Related to exertion: no Pallor: no Diaphoresis: no Dyspnea: yes Chest pain: no  Relevant past medical, surgical, family and social history reviewed and updated as indicated. Interim medical history since our last visit reviewed. Allergies and medications reviewed and updated.  Review of Systems  Constitutional: Negative.   Respiratory: Negative.   Cardiovascular: Negative.   Gastrointestinal: Negative.   Psychiatric/Behavioral: Negative.     Per HPI unless specifically indicated above     Objective:    BP 109/61 mmHg  Pulse 116  Temp(Src) 98.8 F (37.1 C)  Wt 112 lb (50.803 kg)  SpO2 96%  LMP  (LMP Unknown)  Wt Readings from Last 3 Encounters:  06/12/15 112 lb (50.803 kg)   05/11/15 111 lb 12.8 oz (50.712 kg)  01/03/15 114 lb (51.71 kg)   MMSE: 16- see scanned documents  Physical Exam  Constitutional: She is oriented to person, place, and time. She appears well-developed and well-nourished. No distress.  HENT:  Head: Normocephalic and atraumatic.  Right Ear: Hearing normal.  Left Ear: Hearing normal.  Nose: Nose normal.  Eyes: Conjunctivae and lids are normal. Right eye exhibits no discharge. Left eye exhibits no discharge. No scleral icterus.  Cardiovascular: Normal rate and regular rhythm.  Exam reveals no gallop and no friction rub.   Murmur heard. Pulmonary/Chest: Effort normal. No respiratory distress. She has wheezes. She has no rales. She exhibits no tenderness.  On the L only  Musculoskeletal: Normal range of motion.  Neurological: She is alert and oriented to person, place, and time.  Skin: Skin is warm, dry and intact. No rash noted. No erythema. No pallor.  Psychiatric: She has a normal mood and affect. Her speech is normal and behavior is normal. Judgment and thought content normal. Cognition and memory are normal.  Nursing note and vitals reviewed.   Results for orders placed or performed in visit on 05/11/15  Bayer DCA Hb A1c Waived  Result Value Ref Range   Bayer DCA Hb A1c Waived 6.4 <7.0 %  CBC With Differential/Platelet  Result Value Ref Range   WBC 6.9 3.4 - 10.8 x10E3/uL   RBC 4.81 3.77 - 5.28 x10E6/uL   Hemoglobin 14.7 11.1 - 15.9 g/dL   Hematocrit 43.6 34.0 - 46.6 %   MCV 91 79 -  97 fL   MCH 30.6 26.6 - 33.0 pg   MCHC 33.7 31.5 - 35.7 g/dL   RDW 13.8 12.3 - 15.4 %   Platelets 236 150 - 379 x10E3/uL   Neutrophils 65 %   Lymphs 28 %   MID 7 %   Neutrophils Absolute 4.5 1.4 - 7.0 x10E3/uL   Lymphocytes Absolute 1.9 0.7 - 3.1 x10E3/uL   MID (Absolute) 0.5 0.1 - 1.6 X10E3/uL  Comprehensive metabolic panel  Result Value Ref Range   Glucose 57 (L) 65 - 99 mg/dL   BUN 14 8 - 27 mg/dL   Creatinine, Ser 1.19 (H) 0.57 - 1.00  mg/dL   GFR calc non Af Amer 44 (L) >59 mL/min/1.73   GFR calc Af Amer 51 (L) >59 mL/min/1.73   BUN/Creatinine Ratio 12 11 - 26   Sodium 143 134 - 144 mmol/L   Potassium 3.9 3.5 - 5.2 mmol/L   Chloride 100 97 - 108 mmol/L   CO2 28 18 - 29 mmol/L   Calcium 10.0 8.7 - 10.3 mg/dL   Total Protein 7.1 6.0 - 8.5 g/dL   Albumin 4.2 3.5 - 4.8 g/dL   Globulin, Total 2.9 1.5 - 4.5 g/dL   Albumin/Globulin Ratio 1.4 1.1 - 2.5   Bilirubin Total 0.3 0.0 - 1.2 mg/dL   Alkaline Phosphatase 97 39 - 117 IU/L   AST 15 0 - 40 IU/L   ALT 10 0 - 32 IU/L  Lipid Panel Piccolo, Waived  Result Value Ref Range   Cholesterol Piccolo, Waived 144 <200 mg/dL   HDL Chol Piccolo, Waived 52 (L) >59 mg/dL   Triglycerides Piccolo,Waived 69 <150 mg/dL   Chol/HDL Ratio Piccolo,Waive 2.8 mg/dL   LDL Chol Calc Piccolo Waived 79 <100 mg/dL   VLDL Chol Calc Piccolo,Waive 14 <30 mg/dL  Microalbumin, Urine Waived  Result Value Ref Range   Microalb, Ur Waived 30 (H) 0 - 19 mg/L   Creatinine, Urine Waived 200 10 - 300 mg/dL   Microalb/Creat Ratio <30 <30 mg/g  TSH  Result Value Ref Range   TSH 0.638 0.450 - 4.500 uIU/mL  UA/M w/rflx Culture, Routine  Result Value Ref Range   Specific Gravity, UA 1.015 1.005 - 1.030   pH, UA 5.5 5.0 - 7.5   Color, UA Yellow Yellow   Appearance Ur Cloudy (A) Clear   Leukocytes, UA Negative Negative   Protein, UA Negative Negative/Trace   Glucose, UA Negative Negative   Ketones, UA Negative Negative   RBC, UA Negative Negative   Bilirubin, UA Negative Negative   Urobilinogen, Ur 1.0 0.2 - 1.0 mg/dL   Nitrite, UA Negative Negative      Assessment & Plan:   Problem List Items Addressed This Visit      Cardiovascular and Mediastinum   Essential hypertension    BP quite low. Concerning with her dizziness. Will stop her triamterene and continue her on her HCTZ and recheck in 1 month.       Relevant Medications   hydrochlorothiazide (HYDRODIURIL) 25 MG tablet     Endocrine    Type 2 diabetes mellitus with background retinopathy    Last visit 6.4, given the dizziness, will stop her glimepiride and see how she does at recheck.         Nervous and Auditory   Dementia    MMSE improved from 14.5 to 16 today. Husband doesn't notice a difference. Await appointment with Dr. Manuella Ghazi. Continue current regimen at this time.  Other Visit Diagnoses    Cough    -  Primary    Has history of lung mass per epic- will check CXR. Await results.     Relevant Orders    DG Chest 2 View    Wheezes        Relevant Orders    DG Chest 2 View    Immunization due        Flu shot given today.         Follow up plan: Return in about 4 weeks (around 07/10/2015).

## 2015-06-12 NOTE — Assessment & Plan Note (Signed)
MMSE improved from 14.5 to 16 today. Husband doesn't notice a difference. Await appointment with Dr. Manuella Ghazi. Continue current regimen at this time.

## 2015-06-30 DIAGNOSIS — F0280 Dementia in other diseases classified elsewhere without behavioral disturbance: Secondary | ICD-10-CM | POA: Diagnosis not present

## 2015-06-30 DIAGNOSIS — I6523 Occlusion and stenosis of bilateral carotid arteries: Secondary | ICD-10-CM | POA: Diagnosis not present

## 2015-06-30 DIAGNOSIS — G309 Alzheimer's disease, unspecified: Secondary | ICD-10-CM | POA: Diagnosis not present

## 2015-06-30 DIAGNOSIS — E559 Vitamin D deficiency, unspecified: Secondary | ICD-10-CM | POA: Diagnosis not present

## 2015-06-30 DIAGNOSIS — E538 Deficiency of other specified B group vitamins: Secondary | ICD-10-CM | POA: Diagnosis not present

## 2015-06-30 DIAGNOSIS — F0150 Vascular dementia without behavioral disturbance: Secondary | ICD-10-CM | POA: Diagnosis not present

## 2015-07-04 ENCOUNTER — Other Ambulatory Visit: Payer: Self-pay | Admitting: Neurology

## 2015-07-04 DIAGNOSIS — F0390 Unspecified dementia without behavioral disturbance: Secondary | ICD-10-CM

## 2015-07-10 ENCOUNTER — Telehealth: Payer: Self-pay

## 2015-07-10 ENCOUNTER — Ambulatory Visit: Payer: Medicare Other | Admitting: Family Medicine

## 2015-07-10 ENCOUNTER — Encounter: Payer: Self-pay | Admitting: Family Medicine

## 2015-07-10 DIAGNOSIS — F0150 Vascular dementia without behavioral disturbance: Secondary | ICD-10-CM | POA: Insufficient documentation

## 2015-07-10 DIAGNOSIS — E538 Deficiency of other specified B group vitamins: Secondary | ICD-10-CM | POA: Insufficient documentation

## 2015-07-10 DIAGNOSIS — G309 Alzheimer's disease, unspecified: Secondary | ICD-10-CM

## 2015-07-10 DIAGNOSIS — F0280 Dementia in other diseases classified elsewhere without behavioral disturbance: Secondary | ICD-10-CM | POA: Insufficient documentation

## 2015-07-10 NOTE — Telephone Encounter (Signed)
Called to notify patient that she needs to come in for a Vitamin B 12 injection. No answer, left voice mail will try again.  To have 1 injection weekly for four weeks, then monthly for four months. After completion she needs to start Vitamin B 12 1000 mcg OTC daily.

## 2015-07-11 ENCOUNTER — Ambulatory Visit (INDEPENDENT_AMBULATORY_CARE_PROVIDER_SITE_OTHER): Payer: Medicare Other

## 2015-07-11 DIAGNOSIS — E538 Deficiency of other specified B group vitamins: Secondary | ICD-10-CM

## 2015-07-11 MED ORDER — CYANOCOBALAMIN 1000 MCG/ML IJ SOLN
1000.0000 ug | Freq: Once | INTRAMUSCULAR | Status: AC
  Administered 2015-07-11: 1000 ug via INTRAMUSCULAR

## 2015-07-12 ENCOUNTER — Ambulatory Visit
Admission: RE | Admit: 2015-07-12 | Discharge: 2015-07-12 | Disposition: A | Discharge status: Home / Self Care | Payer: Medicare Other | Source: Ambulatory Visit | Attending: Neurology | Admitting: Neurology

## 2015-07-12 DIAGNOSIS — I739 Peripheral vascular disease, unspecified: Secondary | ICD-10-CM | POA: Insufficient documentation

## 2015-07-12 DIAGNOSIS — F0150 Vascular dementia without behavioral disturbance: Secondary | ICD-10-CM | POA: Insufficient documentation

## 2015-07-12 DIAGNOSIS — F0390 Unspecified dementia without behavioral disturbance: Secondary | ICD-10-CM

## 2015-07-12 DIAGNOSIS — G309 Alzheimer's disease, unspecified: Secondary | ICD-10-CM | POA: Insufficient documentation

## 2015-07-12 DIAGNOSIS — F0280 Dementia in other diseases classified elsewhere without behavioral disturbance: Secondary | ICD-10-CM | POA: Diagnosis not present

## 2015-07-17 DIAGNOSIS — E785 Hyperlipidemia, unspecified: Secondary | ICD-10-CM | POA: Diagnosis not present

## 2015-07-17 DIAGNOSIS — I6529 Occlusion and stenosis of unspecified carotid artery: Secondary | ICD-10-CM | POA: Diagnosis not present

## 2015-07-17 DIAGNOSIS — I10 Essential (primary) hypertension: Secondary | ICD-10-CM | POA: Diagnosis not present

## 2015-07-19 DIAGNOSIS — I6523 Occlusion and stenosis of bilateral carotid arteries: Secondary | ICD-10-CM | POA: Diagnosis not present

## 2015-07-19 DIAGNOSIS — E785 Hyperlipidemia, unspecified: Secondary | ICD-10-CM | POA: Diagnosis not present

## 2015-07-25 ENCOUNTER — Other Ambulatory Visit: Payer: Self-pay | Admitting: Vascular Surgery

## 2015-08-01 DIAGNOSIS — R7309 Other abnormal glucose: Secondary | ICD-10-CM | POA: Diagnosis not present

## 2015-08-02 ENCOUNTER — Other Ambulatory Visit: Payer: Self-pay

## 2015-08-02 ENCOUNTER — Other Ambulatory Visit: Payer: Medicare Other

## 2015-08-02 ENCOUNTER — Emergency Department
Admission: EM | Admit: 2015-08-02 | Discharge: 2015-08-02 | Disposition: A | Discharge status: Home / Self Care | Payer: Medicare Other | Attending: Emergency Medicine | Admitting: Emergency Medicine

## 2015-08-02 ENCOUNTER — Emergency Department: Payer: Medicare Other

## 2015-08-02 DIAGNOSIS — I10 Essential (primary) hypertension: Secondary | ICD-10-CM | POA: Diagnosis not present

## 2015-08-02 DIAGNOSIS — I493 Ventricular premature depolarization: Secondary | ICD-10-CM | POA: Diagnosis not present

## 2015-08-02 DIAGNOSIS — R42 Dizziness and giddiness: Secondary | ICD-10-CM | POA: Diagnosis not present

## 2015-08-02 DIAGNOSIS — R55 Syncope and collapse: Secondary | ICD-10-CM | POA: Diagnosis not present

## 2015-08-02 DIAGNOSIS — E11319 Type 2 diabetes mellitus with unspecified diabetic retinopathy without macular edema: Secondary | ICD-10-CM | POA: Insufficient documentation

## 2015-08-02 DIAGNOSIS — R05 Cough: Secondary | ICD-10-CM | POA: Diagnosis not present

## 2015-08-02 DIAGNOSIS — Z720 Tobacco use: Secondary | ICD-10-CM | POA: Diagnosis not present

## 2015-08-02 DIAGNOSIS — Z7951 Long term (current) use of inhaled steroids: Secondary | ICD-10-CM | POA: Diagnosis not present

## 2015-08-02 DIAGNOSIS — R0602 Shortness of breath: Secondary | ICD-10-CM | POA: Diagnosis not present

## 2015-08-02 LAB — URINALYSIS COMPLETE WITH MICROSCOPIC (ARMC ONLY)
Bilirubin Urine: NEGATIVE
Glucose, UA: NEGATIVE mg/dL
Hgb urine dipstick: NEGATIVE
KETONES UR: NEGATIVE mg/dL
Leukocytes, UA: NEGATIVE
NITRITE: NEGATIVE
PH: 5 (ref 5.0–8.0)
PROTEIN: 30 mg/dL — AB
SPECIFIC GRAVITY, URINE: 1.023 (ref 1.005–1.030)

## 2015-08-02 LAB — BASIC METABOLIC PANEL
ANION GAP: 7 (ref 5–15)
BUN: 13 mg/dL (ref 6–20)
CALCIUM: 9.3 mg/dL (ref 8.9–10.3)
CO2: 30 mmol/L (ref 22–32)
Chloride: 102 mmol/L (ref 101–111)
Creatinine, Ser: 0.84 mg/dL (ref 0.44–1.00)
Glucose, Bld: 191 mg/dL — ABNORMAL HIGH (ref 65–99)
Potassium: 3 mmol/L — ABNORMAL LOW (ref 3.5–5.1)
Sodium: 139 mmol/L (ref 135–145)

## 2015-08-02 LAB — CBC
HCT: 37.9 % (ref 35.0–47.0)
HEMOGLOBIN: 12.5 g/dL (ref 12.0–16.0)
MCH: 29 pg (ref 26.0–34.0)
MCHC: 33.1 g/dL (ref 32.0–36.0)
MCV: 87.7 fL (ref 80.0–100.0)
Platelets: 242 10*3/uL (ref 150–440)
RBC: 4.32 MIL/uL (ref 3.80–5.20)
RDW: 13.7 % (ref 11.5–14.5)
WBC: 7.6 10*3/uL (ref 3.6–11.0)

## 2015-08-02 LAB — TROPONIN I: Troponin I: <0.03 ng/mL (ref <0.031)

## 2015-08-02 MED ORDER — SODIUM CHLORIDE 0.9 % IV BOLUS (SEPSIS)
500.0000 mL | Freq: Once | INTRAVENOUS | Status: AC
  Administered 2015-08-02: 500 mL via INTRAVENOUS

## 2015-08-02 NOTE — ED Provider Notes (Signed)
[facility] Emergency Department Provider Note  ____________________________________________  Time seen: Approximately 210 AM  I have reviewed the triage vital signs and the nursing notes.   HISTORY  Chief Complaint Dizziness    HPI DARSI TIEN is a 80 y.o. female who comes into the hospital today reporting a syncopal event. The patient reports that she was walking from the fridge to the living room and she passed out. When asked how long she was out the patient reports that she wasn't really out she fainted a little bit. The patient reports that she does not think she was out for very long but she seemed to wake up on the floor. The patient reports that she was scared because she was home alone. The patient felt a little bit short of breath and reports that she was feeling dizzy most of the day. The patient denies any chest pain or abdominal pain. She has had a cough for a week and has an appointment to see her physician on Monday. The patient reports that her bowels have been a little runny but she has not been vomiting. She reports that she is diabetic and her blood sugars have been okay. She also reports that she's been eating and drinking well. The patient reports that she wanted to come in and get checked out again since she was home by herself.   Past Medical History  Diagnosis Date  . Hyperlipidemia   . Hypertension   . Diabetes mellitus without complication [facility])     Patient Active Problem List   Diagnosis Date Noted  . Mixed Alzheimer's and vascular dementia 07/10/2015  . B12 deficiency 07/10/2015  . Malnutrition ([place]) 05/11/2015  . Dementia 05/11/2015  . Type 2 diabetes mellitus with background retinopathy ([place]) 05/11/2015  . Vertigo 05/11/2015  . HLD (hyperlipidemia) 05/11/2015  . Essential hypertension 05/11/2015  . Multiple thyroid nodules 05/11/2015  . Carotid stenosis 05/11/2015  . Aortic atherosclerosis ([place]) 05/11/2015  .  Polycythemia 02/12/2014  . Hyperthyroidism, subclinical 02/12/2014  . Cardiac murmur 02/11/2014  . Carotid artery bruit 02/11/2014  . Fast heart beat 02/11/2014  . Lung mass 06/10/2012  . Compulsive tobacco user syndrome 06/09/2012    Past Surgical History  Procedure Laterality Date  . Abdominal hysterectomy      heavy bleeding    Current Outpatient Rx  Name  Route  Sig  Dispense  Refill  . ACCU-CHEK SOFTCLIX LANCETS lancets               . atorvastatin (LIPITOR) 20 MG tablet               . donepezil (ARICEPT) 10 MG tablet   Oral   Take 1 tablet (10 mg total) by mouth at bedtime.   30 tablet   3   . Fluticasone-Salmeterol (ADVAIR) 100-50 MCG/DOSE AEPB   Inhalation   Inhale 1 puff into the lungs 2 (two) times daily.   1 each   3   . hydrochlorothiazide (HYDRODIURIL) 25 MG tablet   Oral   Take 1 tablet (25 mg total) by mouth daily.   30 tablet   3   . metFORMIN (GLUCOPHAGE) 500 MG tablet               . Nutritional Supplements (ENSURE NUTRITION SHAKE) LIQD   Oral   Take 8 oz by mouth 2 (two) times daily.   237 mL   12   . sertraline (ZOLOFT) 50 MG tablet  Oral   Take 1 tablet (50 mg total) by mouth at bedtime.   30 tablet   3     Allergies Review of patient's allergies indicates no known allergies.  Family History  Problem Relation Age of Onset  . Hypertension Mother   . Hypertension Father   . Heart disease Sister   . Hypertension Brother     Social History Social History  Substance Use Topics  . Smoking status: Current Every Day Smoker -- 0.50 packs/day  . Smokeless tobacco: Never Used  . Alcohol Use: No    Review of Systems Constitutional: No fever/chills Eyes: No visual changes. ENT: No sore throat. Cardiovascular: Denies chest pain. Respiratory: cough with mild SOB Gastrointestinal: No abdominal pain.  No nausea, no vomiting.  No diarrhea.  No constipation. Genitourinary: Negative for dysuria. Musculoskeletal: Negative  for back pain. Skin: Negative for rash. Neurological: dizziness and syncope  10-point ROS otherwise negative.  ____________________________________________   PHYSICAL EXAM:  VITAL SIGNS: ED Triage Vitals  Enc Vitals Group     BP 08/02/15 0037 146/68 mmHg     Pulse Rate 08/02/15 0037 86     Resp 08/02/15 0133 12     Temp 08/02/15 0037 98.3 F (36.8 C)     Temp Source 08/02/15 0037 Oral     SpO2 08/02/15 0037 97 %     Weight 08/02/15 0045 138 lb (62.596 kg)     Height 08/02/15 0045 5\' 1"  (1.549 m)     Head Cir --      Peak Flow --      Pain Score 08/02/15 0038 8     Pain Loc --      Pain Edu? --      Excl. in [place]? --     Constitutional: Alert and oriented. Well appearing and in no acute distress. Eyes: Conjunctivae are normal. PERRL. EOMI. Head: Atraumatic. Nose: No congestion/rhinnorhea. Neck: no cervical spine tenderness to palpation Mouth/Throat: Mucous membranes are moist.  Oropharynx non-erythematous. Cardiovascular: Normal rate, regular rhythm. Grossly normal heart sounds.  Good peripheral circulation. Respiratory: Normal respiratory effort.  No retractions. Lungs CTAB. Gastrointestinal: Soft and nontender. No distention. Positive bowel sounds Musculoskeletal: No lower extremity tenderness nor edema.   Neurologic:  Normal speech and language. No gross focal neurologic deficits are appreciated.  Skin:  Skin is warm, dry and intact.  Psychiatric: Mood and affect are normal.   ____________________________________________   LABS (all labs ordered are listed, but only abnormal results are displayed)  Labs Reviewed  BASIC METABOLIC PANEL - Abnormal; Notable for the following:    Potassium 3.0 (*)    Glucose, Bld 191 (*)    All other components within normal limits  URINALYSIS COMPLETEWITH MICROSCOPIC ([facility] ONLY) - Abnormal; Notable for the following:    Color, Urine YELLOW (*)    APPearance CLEAR (*)    Protein, ur 30 (*)    Bacteria, UA RARE (*)    Squamous  Epithelial / LPF 0-5 (*)    All other components within normal limits  CBC  TROPONIN I  TROPONIN I  CBG MONITORING, ED   ____________________________________________  EKG  ED ECG REPORT I, Loney Hering, the attending physician, personally viewed and interpreted this ECG.   Date: 08/02/2015  EKG Time: 0046  Rate: 81  Rhythm: normal sinus rhythm  Axis: normal  Intervals:none  ST&T Change: none  ____________________________________________  RADIOLOGY  CT Head: No acute findings. Moderate generalized atrophy and chronic small vessel disease  ____________________________________________   PROCEDURES  Procedure(s) performed: None  Critical Care performed: No  ____________________________________________   INITIAL IMPRESSION / ASSESSMENT AND PLAN / ED COURSE  Pertinent labs & imaging results that were available during my care of the patient were reviewed by me and considered in my medical decision making (see chart for details).  This is a 80 year old female who comes into the hospital today with dizziness and what she reports is a syncopal event. The patient denies any concerns at this time aside from a cough. The patient's blood work is unremarkable and she does not feel dizzy at this time. I will do a repeat set of troponins and reassess the patient.  On reassessment the patient reports that her dizziness has improved. After speaking with the patient's family they report that she's having preop surgery for a carotid endarterectomy. The patient also does have a history of vertigo and has had an MRI within the last month. I feel that that may be the cause of her dizziness. The patient does have dementia so I am unsure how much of her history is accurate. The patient keeps reporting that she had a knot on her neck and her family member reports that she had a biopsy on that area recently but that is not what her surgery is for. The patient is unsure if she can make it to  her appointment but I did encourage her to try to still have her preop appointment today so she could have her carotid endarterectomy next week. The patient will be discharged to home to follow-up with Dr. Hinton Lovely and her primary care physician for further evaluation and treatment of her dizziness. The patient had no further complaints in the emergency room and her dizziness symptoms are improved. ____________________________________________   FINAL CLINICAL IMPRESSION(S) / ED DIAGNOSES  Final diagnoses:  Dizziness  PVC's (premature ventricular contractions)      Loney Hering, MD 08/02/15 0740

## 2015-08-02 NOTE — ED Notes (Signed)
Pt arrived via [facility] with C/O dizziness and nervousness that began this morning

## 2015-08-02 NOTE — ED Notes (Signed)
Resumed care from [place]. Pt resting with family at bedside. VS WNL. NAD noted.

## 2015-08-02 NOTE — Discharge Instructions (Signed)
Dizziness Dizziness is a common problem. It is a feeling of unsteadiness or light-headedness. You may feel like you are about to faint. Dizziness can lead to injury if you stumble or fall. Anyone can become dizzy, but dizziness is more common in older adults. This condition can be caused by a number of things, including medicines, dehydration, or illness. HOME CARE INSTRUCTIONS Taking these steps may help with your condition: Eating and Drinking  Drink enough fluid to keep your urine clear or pale yellow. This helps to keep you from becoming dehydrated. Try to drink more clear fluids, such as water.  Do not drink alcohol.  Limit your caffeine intake if directed by your health care provider.  Limit your salt intake if directed by your health care provider. Activity  Avoid making quick movements.  Rise slowly from chairs and steady yourself until you feel okay.  In the morning, first sit up on the side of the bed. When you feel okay, stand slowly while you hold onto something until you know that your balance is fine.  Move your legs often if you need to stand in one place for a long time. Tighten and relax your muscles in your legs while you are standing.  Do not drive or operate heavy machinery if you feel dizzy.  Avoid bending down if you feel dizzy. Place items in your home so that they are easy for you to reach without leaning over. Lifestyle  Do not use any tobacco products, including cigarettes, chewing tobacco, or electronic cigarettes. If you need help quitting, ask your health care provider.  Try to reduce your stress level, such as with yoga or meditation. Talk with your health care provider if you need help. General Instructions  Watch your dizziness for any changes.  Take medicines only as directed by your health care provider. Talk with your health care provider if you think that your dizziness is caused by a medicine that you are taking.  Tell a friend or a family  member that you are feeling dizzy. If he or she notices any changes in your behavior, have this person call your health care provider.  Keep all follow-up visits as directed by your health care provider. This is important. SEEK MEDICAL CARE IF:  Your dizziness does not go away.  Your dizziness or light-headedness gets worse.  You feel nauseous.  You have reduced hearing.  You have new symptoms.  You are unsteady on your feet or you feel like the room is spinning. SEEK IMMEDIATE MEDICAL CARE IF:  You vomit or have diarrhea and are unable to eat or drink anything.  You have problems talking, walking, swallowing, or using your arms, hands, or legs.  You feel generally weak.  You are not thinking clearly or you have trouble forming sentences. It may take a friend or family member to notice this.  You have chest pain, abdominal pain, shortness of breath, or sweating.  Your vision changes.  You notice any bleeding.  You have a headache.  You have neck pain or a stiff neck.  You have a fever.   This information is not intended to replace advice given to you by your health care provider. Make sure you discuss any questions you have with your health care provider.   Document Released: 03/12/2001 Document Revised: 01/31/2015 Document Reviewed: 09/12/2014 Elsevier Interactive Patient Education 2016 [employer].  Near-Syncope Near-syncope (commonly known as near fainting) is sudden weakness, dizziness, or feeling like you might pass out.  During an episode of near-syncope, you may also develop pale skin, have tunnel vision, or feel sick to your stomach (nauseous). Near-syncope may occur when getting up after sitting or while standing for a long time. It is caused by a sudden decrease in blood flow to the brain. This decrease can result from various causes or triggers, most of which are not serious. However, because near-syncope can sometimes be a sign of something serious, a medical  evaluation is required. The specific cause is often not determined. HOME CARE INSTRUCTIONS  Monitor your condition for any changes. The following actions may help to alleviate any discomfort you are experiencing:  Have someone stay with you until you feel stable.  Lie down right away and prop your feet up if you start feeling like you might faint. Breathe deeply and steadily. Wait until all the symptoms have passed. Most of these episodes last only a few minutes. You may feel tired for several hours.   Drink enough fluids to keep your urine clear or pale yellow.   If you are taking blood pressure or heart medicine, get up slowly when seated or lying down. Take several minutes to sit and then stand. This can reduce dizziness.  Follow up with your health care provider as directed. SEEK IMMEDIATE MEDICAL CARE IF:   You have a severe headache.   You have unusual pain in the chest, abdomen, or back.   You are bleeding from the mouth or rectum, or you have black or tarry stool.   You have an irregular or very fast heartbeat.   You have repeated fainting or have seizure-like jerking during an episode.   You faint when sitting or lying down.   You have confusion.   You have difficulty walking.   You have severe weakness.   You have vision problems.  MAKE SURE YOU:   Understand these instructions.  Will watch your condition.  Will get help right away if you are not doing well or get worse.   This information is not intended to replace advice given to you by your health care provider. Make sure you discuss any questions you have with your health care provider.   Document Released: 09/16/2005 Document Revised: 09/21/2013 Document Reviewed: 02/19/2013 Elsevier Interactive Patient Education [employer].

## 2015-08-02 NOTE — ED Notes (Signed)
Pt discharged home after verbalizing understanding of discharge instructions; nad noted. 

## 2015-08-04 ENCOUNTER — Encounter
Admission: RE | Admit: 2015-08-04 | Discharge: 2015-08-04 | Disposition: A | Discharge status: Home / Self Care | Payer: Medicare Other | Source: Ambulatory Visit | Attending: Vascular Surgery | Admitting: Vascular Surgery

## 2015-08-04 DIAGNOSIS — Z01818 Encounter for other preprocedural examination: Secondary | ICD-10-CM

## 2015-08-04 DIAGNOSIS — Z01812 Encounter for preprocedural laboratory examination: Secondary | ICD-10-CM | POA: Insufficient documentation

## 2015-08-04 HISTORY — DX: Chronic obstructive pulmonary disease, unspecified: J44.9

## 2015-08-04 HISTORY — DX: Anxiety disorder, unspecified: F41.9

## 2015-08-04 HISTORY — DX: Other amnesia: R41.3

## 2015-08-04 HISTORY — DX: Functional dyspepsia: K30

## 2015-08-04 LAB — TYPE AND SCREEN
ABO/RH(D): O POS
Antibody Screen: NEGATIVE

## 2015-08-04 LAB — ABO/RH: ABO/RH(D): O POS

## 2015-08-04 LAB — PROTIME-INR
INR: 1.03
PROTHROMBIN TIME: 13.7 s (ref 11.4–15.0)

## 2015-08-04 LAB — SURGICAL PCR SCREEN
MRSA, PCR: NEGATIVE
STAPHYLOCOCCUS AUREUS: NEGATIVE

## 2015-08-04 LAB — APTT: aPTT: 28 seconds (ref 24–36)

## 2015-08-04 NOTE — Patient Instructions (Signed)
  Your procedure is scheduled on: 08/11/15 Fri  Report to Day Surgery.2nd floor medical mall To find out your arrival time please call [phone] between 1PM - 3PM on Thurs 08/10/15  Remember: Instructions that are not followed completely may result in serious medical risk, up to and including death, or upon the discretion of your surgeon and anesthesiologist your surgery may need to be rescheduled.    __x__ 1. Do not eat food or drink liquids after midnight. No gum chewing or hard candies.     ____ 2. No Alcohol for 24 hours before or after surgery.   ____ 3. Bring all medications with you on the day of surgery if instructed.    __x__ 4. Notify your doctor if there is any change in your medical condition     (cold, fever, infections).     Do not wear jewelry, make-up, hairpins, clips or nail polish.  Do not wear lotions, powders, or perfumes. You may wear deodorant.  Do not shave 48 hours prior to surgery. Men may shave face and neck.  Do not bring valuables to the hospital.    [facility] is not responsible for any belongings or valuables.               Contacts, dentures or bridgework may not be worn into surgery.  Leave your suitcase in the car. After surgery it may be brought to your room.  For patients admitted to the hospital, discharge time is determined by your                treatment team.   Patients discharged the day of surgery will not be allowed to drive home.   Please read over the following fact sheets that you were given:      ____ Take these medicines the morning of surgery with A SIP OF WATER:    1.none  2.   3.   4.  5.  6.  ____ Fleet Enema (as directed)   ____ Use CHG Soap as directed  ____ Use inhalers on the day of surgery  _x___ Stop metformin 2 days prior to surgery    ____ Take 1/2 of usual insulin dose the night before surgery and none on the morning of surgery.   ____ Stop Coumadin/Plavix/aspirin on   _x___ Stop Anti-inflammatories  on May take Tylenol as needed   ____ Stop supplements until after surgery.    ____ Bring C-Pap to the hospital.

## 2015-08-10 ENCOUNTER — Ambulatory Visit: Payer: Medicare Other

## 2015-08-11 ENCOUNTER — Encounter: Payer: Self-pay | Admitting: *Deleted

## 2015-08-11 ENCOUNTER — Inpatient Hospital Stay: Payer: Medicare Other

## 2015-08-11 ENCOUNTER — Ambulatory Visit: Payer: Medicare Other | Admitting: Family Medicine

## 2015-08-11 ENCOUNTER — Encounter
Admission: RE | Disposition: A | Discharge status: Skilled Nursing Facility | Payer: Self-pay | Source: Ambulatory Visit | Attending: Vascular Surgery

## 2015-08-11 ENCOUNTER — Inpatient Hospital Stay
Admission: RE | Admit: 2015-08-11 | Discharge: 2015-08-16 | DRG: 025 | Disposition: A | Discharge status: Skilled Nursing Facility | Payer: Medicare Other | Source: Ambulatory Visit | Attending: Vascular Surgery | Admitting: Vascular Surgery

## 2015-08-11 ENCOUNTER — Inpatient Hospital Stay: Payer: Medicare Other | Admitting: Anesthesiology

## 2015-08-11 ENCOUNTER — Ambulatory Visit: Payer: Medicare Other

## 2015-08-11 DIAGNOSIS — I6523 Occlusion and stenosis of bilateral carotid arteries: Secondary | ICD-10-CM | POA: Diagnosis not present

## 2015-08-11 DIAGNOSIS — R29898 Other symptoms and signs involving the musculoskeletal system: Secondary | ICD-10-CM

## 2015-08-11 DIAGNOSIS — I7771 Dissection of carotid artery: Secondary | ICD-10-CM | POA: Diagnosis present

## 2015-08-11 DIAGNOSIS — I493 Ventricular premature depolarization: Secondary | ICD-10-CM | POA: Diagnosis present

## 2015-08-11 DIAGNOSIS — Z7982 Long term (current) use of aspirin: Secondary | ICD-10-CM | POA: Diagnosis not present

## 2015-08-11 DIAGNOSIS — I6521 Occlusion and stenosis of right carotid artery: Secondary | ICD-10-CM | POA: Diagnosis not present

## 2015-08-11 DIAGNOSIS — G308 Other Alzheimer's disease: Secondary | ICD-10-CM | POA: Diagnosis not present

## 2015-08-11 DIAGNOSIS — R41841 Cognitive communication deficit: Secondary | ICD-10-CM | POA: Diagnosis not present

## 2015-08-11 DIAGNOSIS — I739 Peripheral vascular disease, unspecified: Secondary | ICD-10-CM | POA: Diagnosis not present

## 2015-08-11 DIAGNOSIS — F419 Anxiety disorder, unspecified: Secondary | ICD-10-CM | POA: Diagnosis not present

## 2015-08-11 DIAGNOSIS — I6522 Occlusion and stenosis of left carotid artery: Secondary | ICD-10-CM | POA: Diagnosis not present

## 2015-08-11 DIAGNOSIS — F0150 Vascular dementia without behavioral disturbance: Secondary | ICD-10-CM | POA: Diagnosis present

## 2015-08-11 DIAGNOSIS — F0390 Unspecified dementia without behavioral disturbance: Secondary | ICD-10-CM | POA: Diagnosis not present

## 2015-08-11 DIAGNOSIS — F17210 Nicotine dependence, cigarettes, uncomplicated: Secondary | ICD-10-CM | POA: Diagnosis present

## 2015-08-11 DIAGNOSIS — Z720 Tobacco use: Secondary | ICD-10-CM | POA: Diagnosis not present

## 2015-08-11 DIAGNOSIS — J449 Chronic obstructive pulmonary disease, unspecified: Secondary | ICD-10-CM | POA: Diagnosis present

## 2015-08-11 DIAGNOSIS — G9389 Other specified disorders of brain: Secondary | ICD-10-CM | POA: Diagnosis not present

## 2015-08-11 DIAGNOSIS — G8191 Hemiplegia, unspecified affecting right dominant side: Secondary | ICD-10-CM | POA: Diagnosis not present

## 2015-08-11 DIAGNOSIS — E785 Hyperlipidemia, unspecified: Secondary | ICD-10-CM | POA: Diagnosis not present

## 2015-08-11 DIAGNOSIS — J45901 Unspecified asthma with (acute) exacerbation: Secondary | ICD-10-CM | POA: Diagnosis not present

## 2015-08-11 DIAGNOSIS — Z79899 Other long term (current) drug therapy: Secondary | ICD-10-CM | POA: Diagnosis not present

## 2015-08-11 DIAGNOSIS — I472 Ventricular tachycardia: Secondary | ICD-10-CM | POA: Diagnosis present

## 2015-08-11 DIAGNOSIS — Y838 Other surgical procedures as the cause of abnormal reaction of the patient, or of later complication, without mention of misadventure at the time of the procedure: Secondary | ICD-10-CM | POA: Diagnosis present

## 2015-08-11 DIAGNOSIS — R079 Chest pain, unspecified: Secondary | ICD-10-CM | POA: Diagnosis not present

## 2015-08-11 DIAGNOSIS — I63412 Cerebral infarction due to embolism of left middle cerebral artery: Secondary | ICD-10-CM | POA: Diagnosis present

## 2015-08-11 DIAGNOSIS — M6281 Muscle weakness (generalized): Secondary | ICD-10-CM | POA: Diagnosis not present

## 2015-08-11 DIAGNOSIS — F339 Major depressive disorder, recurrent, unspecified: Secondary | ICD-10-CM | POA: Diagnosis not present

## 2015-08-11 DIAGNOSIS — E119 Type 2 diabetes mellitus without complications: Secondary | ICD-10-CM | POA: Diagnosis present

## 2015-08-11 DIAGNOSIS — I63239 Cerebral infarction due to unspecified occlusion or stenosis of unspecified carotid arteries: Secondary | ICD-10-CM | POA: Diagnosis not present

## 2015-08-11 DIAGNOSIS — I639 Cerebral infarction, unspecified: Secondary | ICD-10-CM

## 2015-08-11 DIAGNOSIS — R4701 Aphasia: Secondary | ICD-10-CM | POA: Diagnosis not present

## 2015-08-11 DIAGNOSIS — I10 Essential (primary) hypertension: Secondary | ICD-10-CM | POA: Diagnosis present

## 2015-08-11 DIAGNOSIS — Z48812 Encounter for surgical aftercare following surgery on the circulatory system: Secondary | ICD-10-CM | POA: Diagnosis not present

## 2015-08-11 DIAGNOSIS — K5900 Constipation, unspecified: Secondary | ICD-10-CM | POA: Diagnosis not present

## 2015-08-11 DIAGNOSIS — Z8249 Family history of ischemic heart disease and other diseases of the circulatory system: Secondary | ICD-10-CM

## 2015-08-11 DIAGNOSIS — Y92234 Operating room of hospital as the place of occurrence of the external cause: Secondary | ICD-10-CM | POA: Diagnosis present

## 2015-08-11 DIAGNOSIS — R05 Cough: Secondary | ICD-10-CM | POA: Diagnosis not present

## 2015-08-11 DIAGNOSIS — S15002D Unspecified injury of left carotid artery, subsequent encounter: Secondary | ICD-10-CM

## 2015-08-11 DIAGNOSIS — I69351 Hemiplegia and hemiparesis following cerebral infarction affecting right dominant side: Secondary | ICD-10-CM | POA: Diagnosis not present

## 2015-08-11 DIAGNOSIS — R278 Other lack of coordination: Secondary | ICD-10-CM | POA: Diagnosis not present

## 2015-08-11 DIAGNOSIS — I97821 Postprocedural cerebrovascular infarction during other surgery: Secondary | ICD-10-CM | POA: Diagnosis not present

## 2015-08-11 DIAGNOSIS — I63442 Cerebral infarction due to embolism of left cerebellar artery: Secondary | ICD-10-CM | POA: Diagnosis not present

## 2015-08-11 DIAGNOSIS — R601 Generalized edema: Secondary | ICD-10-CM | POA: Diagnosis not present

## 2015-08-11 HISTORY — PX: PERIPHERAL VASCULAR CATHETERIZATION: SHX172C

## 2015-08-11 HISTORY — PX: ENDARTERECTOMY: SHX5162

## 2015-08-11 LAB — CBC
HEMATOCRIT: 29.3 % — AB (ref 35.0–47.0)
Hemoglobin: 9.7 g/dL — ABNORMAL LOW (ref 12.0–16.0)
MCH: 29.3 pg (ref 26.0–34.0)
MCHC: 32.9 g/dL (ref 32.0–36.0)
MCV: 88.8 fL (ref 80.0–100.0)
Platelets: 219 10*3/uL (ref 150–440)
RBC: 3.3 MIL/uL — ABNORMAL LOW (ref 3.80–5.20)
RDW: 14 % (ref 11.5–14.5)
WBC: 13.3 10*3/uL — ABNORMAL HIGH (ref 3.6–11.0)

## 2015-08-11 LAB — COMPREHENSIVE METABOLIC PANEL
ALBUMIN: 2.7 g/dL — AB (ref 3.5–5.0)
ALK PHOS: 43 U/L (ref 38–126)
ALT: 19 U/L (ref 14–54)
ANION GAP: 5 (ref 5–15)
AST: 24 U/L (ref 15–41)
BUN: 14 mg/dL (ref 6–20)
CALCIUM: 7.6 mg/dL — AB (ref 8.9–10.3)
CO2: 25 mmol/L (ref 22–32)
Chloride: 109 mmol/L (ref 101–111)
Creatinine, Ser: 0.82 mg/dL (ref 0.44–1.00)
GLUCOSE: 175 mg/dL — AB (ref 65–99)
POTASSIUM: 3.7 mmol/L (ref 3.5–5.1)
Sodium: 139 mmol/L (ref 135–145)
TOTAL PROTEIN: 5.5 g/dL — AB (ref 6.5–8.1)

## 2015-08-11 LAB — LIPID PANEL
CHOLESTEROL: 143 mg/dL (ref 0–200)
HDL: 41 mg/dL (ref >40)
LDL Cholesterol: 88 mg/dL (ref 0–99)
TRIGLYCERIDES: 72 mg/dL (ref <150)
Total CHOL/HDL Ratio: 3.5 RATIO
VLDL: 14 mg/dL (ref 0–40)

## 2015-08-11 LAB — PROTIME-INR
INR: 1.22
PROTHROMBIN TIME: 15.6 s — AB (ref 11.4–15.0)

## 2015-08-11 LAB — GLUCOSE, CAPILLARY
Glucose-Capillary: 131 mg/dL — ABNORMAL HIGH (ref 65–99)
Glucose-Capillary: 166 mg/dL — ABNORMAL HIGH (ref 65–99)

## 2015-08-11 LAB — MRSA PCR SCREENING: MRSA BY PCR: NEGATIVE

## 2015-08-11 LAB — TROPONIN I: Troponin I: <0.03 ng/mL (ref <0.031)

## 2015-08-11 SURGERY — ENDARTERECTOMY, CAROTID
Anesthesia: General | Laterality: Left | Wound class: Clean

## 2015-08-11 SURGERY — CAROTID ANGIOGRAPHY
Anesthesia: Moderate Sedation | Laterality: Left

## 2015-08-11 MED ORDER — LACTATED RINGERS IV SOLN
INTRAVENOUS | Status: DC | PRN
  Administered 2015-08-11: 07:00:00 via INTRAVENOUS

## 2015-08-11 MED ORDER — SODIUM CHLORIDE 0.9 % IV SOLN
INTRAVENOUS | Status: DC | PRN
  Administered 2015-08-11: 200 mL via INTRAMUSCULAR

## 2015-08-11 MED ORDER — SUCCINYLCHOLINE CHLORIDE 20 MG/ML IJ SOLN
INTRAMUSCULAR | Status: DC | PRN
Start: 2015-08-11 — End: 2015-08-11
  Administered 2015-08-11: 80 mg via INTRAVENOUS

## 2015-08-11 MED ORDER — SODIUM CHLORIDE 0.9 % IV SOLN
INTRAVENOUS | Status: DC
  Administered 2015-08-11: 07:00:00 via INTRAVENOUS

## 2015-08-11 MED ORDER — MIDAZOLAM HCL 2 MG/2ML IJ SOLN
INTRAMUSCULAR | Status: AC
  Filled 2015-08-11: qty 2

## 2015-08-11 MED ORDER — LIDOCAINE HCL (PF) 1 % IJ SOLN
INTRAMUSCULAR | Status: AC
  Filled 2015-08-11: qty 10

## 2015-08-11 MED ORDER — IOHEXOL 350 MG/ML SOLN
75.0000 mL | Freq: Once | INTRAVENOUS | Status: AC | PRN
  Administered 2015-08-11: 75 mL via INTRAVENOUS

## 2015-08-11 MED ORDER — SODIUM CHLORIDE 0.9 % IV SOLN
INTRAVENOUS | Status: DC
  Administered 2015-08-11 (×2): via INTRAVENOUS

## 2015-08-11 MED ORDER — PROPOFOL 10 MG/ML IV BOLUS
INTRAVENOUS | Status: DC | PRN
  Administered 2015-08-11: 20 mg via INTRAVENOUS
  Administered 2015-08-11: 80 mg via INTRAVENOUS
  Administered 2015-08-11: 20 mg via INTRAVENOUS

## 2015-08-11 MED ORDER — SERTRALINE HCL 50 MG PO TABS
50.0000 mg | ORAL_TABLET | Freq: Every day | ORAL | Status: DC
Start: 2015-08-11 — End: 2015-08-16
  Administered 2015-08-12 – 2015-08-15 (×4): 50 mg via ORAL
  Filled 2015-08-11 (×5): qty 1

## 2015-08-11 MED ORDER — ATORVASTATIN CALCIUM 20 MG PO TABS
20.0000 mg | ORAL_TABLET | Freq: Every day | ORAL | Status: DC
  Administered 2015-08-11: 20 mg via ORAL
  Filled 2015-08-11: qty 1

## 2015-08-11 MED ORDER — DOPAMINE-DEXTROSE 3.2-5 MG/ML-% IV SOLN
3.0000 ug/kg/min | INTRAVENOUS | Status: DC
  Administered 2015-08-11: 3 ug/kg/min via INTRAVENOUS
  Filled 2015-08-11: qty 250

## 2015-08-11 MED ORDER — LABETALOL HCL 5 MG/ML IV SOLN
INTRAVENOUS | Status: AC
  Administered 2015-08-11: 20 mg
  Filled 2015-08-11: qty 4

## 2015-08-11 MED ORDER — FAMOTIDINE 20 MG PO TABS
20.0000 mg | ORAL_TABLET | Freq: Once | ORAL | Status: AC
  Administered 2015-08-11: 20 mg via ORAL

## 2015-08-11 MED ORDER — TIROFIBAN HCL IN NACL 5-0.9 MG/100ML-% IV SOLN
0.1500 ug/kg/min | INTRAVENOUS | Status: DC
  Administered 2015-08-11: 0.15 ug/kg/min via INTRAVENOUS

## 2015-08-11 MED ORDER — LIDOCAINE HCL (CARDIAC) 20 MG/ML IV SOLN
INTRAVENOUS | Status: DC | PRN
Start: 2015-08-11 — End: 2015-08-11
  Administered 2015-08-11: 80 mg via INTRAVENOUS

## 2015-08-11 MED ORDER — HEPARIN SODIUM (PORCINE) 5000 UNIT/ML IJ SOLN: 2000.0000 [IU] | Freq: Once | INTRAMUSCULAR | Status: DC

## 2015-08-11 MED ORDER — PHENYLEPHRINE HCL 10 MG/ML IJ SOLN
INTRAMUSCULAR | Status: AC
  Filled 2015-08-11: qty 1

## 2015-08-11 MED ORDER — ACETAMINOPHEN 650 MG RE SUPP: 325.0000 mg | RECTAL | Status: DC | PRN

## 2015-08-11 MED ORDER — MORPHINE SULFATE (PF) 2 MG/ML IV SOLN
2.0000 mg | INTRAVENOUS | Status: DC | PRN
  Administered 2015-08-11 (×2): 2 mg via INTRAVENOUS
  Filled 2015-08-11 (×3): qty 1

## 2015-08-11 MED ORDER — FENTANYL CITRATE (PF) 100 MCG/2ML IJ SOLN
INTRAMUSCULAR | Status: DC | PRN
  Administered 2015-08-11: 150 ug via INTRAVENOUS
  Administered 2015-08-11 (×2): 100 ug via INTRAVENOUS

## 2015-08-11 MED ORDER — INSULIN ASPART 100 UNIT/ML ~~LOC~~ SOLN: 0.0000 [IU] | SUBCUTANEOUS | Status: DC

## 2015-08-11 MED ORDER — DONEPEZIL HCL 5 MG PO TABS
10.0000 mg | ORAL_TABLET | Freq: Every day | ORAL | Status: DC
  Administered 2015-08-12 – 2015-08-15 (×4): 10 mg via ORAL
  Filled 2015-08-11 (×2): qty 2
  Filled 2015-08-11 (×2): qty 1
  Filled 2015-08-11: qty 2
  Filled 2015-08-11: qty 1

## 2015-08-11 MED ORDER — DEXAMETHASONE SODIUM PHOSPHATE 4 MG/ML IJ SOLN
INTRAMUSCULAR | Status: DC | PRN
  Administered 2015-08-11: 10 mg via INTRAVENOUS

## 2015-08-11 MED ORDER — METOCLOPRAMIDE HCL 5 MG/ML IJ SOLN: 10.0000 mg | Freq: Once | INTRAMUSCULAR | Status: DC | PRN

## 2015-08-11 MED ORDER — HEPARIN SODIUM (PORCINE) 1000 UNIT/ML IJ SOLN
INTRAMUSCULAR | Status: AC
  Filled 2015-08-11: qty 1

## 2015-08-11 MED ORDER — HYDRALAZINE HCL 20 MG/ML IJ SOLN: 5.0000 mg | INTRAMUSCULAR | Status: DC | PRN

## 2015-08-11 MED ORDER — EPHEDRINE SULFATE 50 MG/ML IJ SOLN: INTRAMUSCULAR | Status: DC | PRN

## 2015-08-11 MED ORDER — HEPARIN SODIUM (PORCINE) 5000 UNIT/ML IJ SOLN
INTRAMUSCULAR | Status: AC
  Filled 2015-08-11: qty 1

## 2015-08-11 MED ORDER — NITROGLYCERIN IN D5W 200-5 MCG/ML-% IV SOLN
INTRAVENOUS | Status: AC
  Filled 2015-08-11: qty 250

## 2015-08-11 MED ORDER — HEPARIN SODIUM (PORCINE) 1000 UNIT/ML IJ SOLN
INTRAMUSCULAR | Status: DC | PRN
  Administered 2015-08-11: 1000 [IU] via INTRAVENOUS
  Administered 2015-08-11: 2000 [IU] via INTRAVENOUS

## 2015-08-11 MED ORDER — SODIUM CHLORIDE 0.9 % IV SOLN: 500.0000 mL | Freq: Once | INTRAVENOUS | Status: DC | PRN

## 2015-08-11 MED ORDER — HYDROCHLOROTHIAZIDE 25 MG PO TABS: 25.0000 mg | ORAL_TABLET | Freq: Every day | ORAL | Status: DC

## 2015-08-11 MED ORDER — LABETALOL HCL 5 MG/ML IV SOLN
10.0000 mg | INTRAVENOUS | Status: DC | PRN
  Administered 2015-08-12: 10 mg via INTRAVENOUS
  Filled 2015-08-11: qty 4

## 2015-08-11 MED ORDER — ALUM & MAG HYDROXIDE-SIMETH 200-200-20 MG/5ML PO SUSP: 15.0000 mL | ORAL | Status: DC | PRN

## 2015-08-11 MED ORDER — GUAIFENESIN-DM 100-10 MG/5ML PO SYRP: 15.0000 mL | ORAL_SOLUTION | ORAL | Status: DC | PRN

## 2015-08-11 MED ORDER — ENSURE ENLIVE PO LIQD
237.0000 mL | Freq: Two times a day (BID) | ORAL | Status: DC
  Administered 2015-08-14 – 2015-08-16 (×4): 237 mL via ORAL

## 2015-08-11 MED ORDER — METOPROLOL TARTRATE 1 MG/ML IV SOLN: 2.0000 mg | INTRAVENOUS | Status: DC | PRN

## 2015-08-11 MED ORDER — CLOPIDOGREL BISULFATE 75 MG PO TABS
75.0000 mg | ORAL_TABLET | Freq: Every day | ORAL | Status: DC
  Administered 2015-08-12 – 2015-08-16 (×5): 75 mg via ORAL
  Filled 2015-08-11 (×5): qty 1

## 2015-08-11 MED ORDER — SUGAMMADEX SODIUM 200 MG/2ML IV SOLN
INTRAVENOUS | Status: DC | PRN
  Administered 2015-08-11: 105.2 mg via INTRAVENOUS

## 2015-08-11 MED ORDER — CEFAZOLIN SODIUM-DEXTROSE 2-3 GM-% IV SOLR: 2.0000 g | INTRAVENOUS | Status: DC

## 2015-08-11 MED ORDER — ENSURE NUTRITION SHAKE PO LIQD
8.0000 [oz_av] | Freq: Two times a day (BID) | ORAL | Status: DC
  Filled 2015-08-11: qty 250

## 2015-08-11 MED ORDER — SODIUM CHLORIDE 0.9 % IJ SOLN
INTRAMUSCULAR | Status: AC
  Filled 2015-08-11: qty 15

## 2015-08-11 MED ORDER — ATROPINE SULFATE 0.1 MG/ML IJ SOLN
INTRAMUSCULAR | Status: AC
Start: 2015-08-11 — End: 2015-08-11
  Filled 2015-08-11: qty 10

## 2015-08-11 MED ORDER — FENTANYL CITRATE (PF) 100 MCG/2ML IJ SOLN
25.0000 ug | INTRAMUSCULAR | Status: DC | PRN
  Administered 2015-08-11 (×4): 25 ug via INTRAVENOUS

## 2015-08-11 MED ORDER — OXYCODONE-ACETAMINOPHEN 5-325 MG PO TABS
1.0000 | ORAL_TABLET | ORAL | Status: DC | PRN
  Filled 2015-08-11: qty 1

## 2015-08-11 MED ORDER — ASPIRIN EC 81 MG PO TBEC
81.0000 mg | DELAYED_RELEASE_TABLET | Freq: Every day | ORAL | Status: DC
  Administered 2015-08-12 – 2015-08-16 (×5): 81 mg via ORAL
  Filled 2015-08-11 (×5): qty 1

## 2015-08-11 MED ORDER — IOHEXOL 300 MG/ML  SOLN
INTRAMUSCULAR | Status: DC | PRN
  Administered 2015-08-11: 45 mL via INTRA_ARTERIAL

## 2015-08-11 MED ORDER — MOMETASONE FURO-FORMOTEROL FUM 100-5 MCG/ACT IN AERO
2.0000 | INHALATION_SPRAY | Freq: Two times a day (BID) | RESPIRATORY_TRACT | Status: DC
  Administered 2015-08-12 – 2015-08-16 (×9): 2 via RESPIRATORY_TRACT
  Filled 2015-08-11 (×3): qty 8.8

## 2015-08-11 MED ORDER — PROMETHAZINE HCL 25 MG/ML IJ SOLN
12.5000 mg | Freq: Four times a day (QID) | INTRAMUSCULAR | Status: DC | PRN
  Administered 2015-08-11: 12.5 mg via INTRAVENOUS
  Filled 2015-08-11: qty 1

## 2015-08-11 MED ORDER — VANCOMYCIN HCL IN DEXTROSE 1-5 GM/200ML-% IV SOLN
1000.0000 mg | Freq: Two times a day (BID) | INTRAVENOUS | Status: AC
  Administered 2015-08-11 – 2015-08-12 (×2): 1000 mg via INTRAVENOUS
  Filled 2015-08-11 (×3): qty 200

## 2015-08-11 MED ORDER — ROCURONIUM BROMIDE 100 MG/10ML IV SOLN
INTRAVENOUS | Status: DC | PRN
  Administered 2015-08-11: 10 mg via INTRAVENOUS
  Administered 2015-08-11: 30 mg via INTRAVENOUS
  Administered 2015-08-11: 10 mg via INTRAVENOUS

## 2015-08-11 MED ORDER — HEPARIN SODIUM (PORCINE) 1000 UNIT/ML IJ SOLN
INTRAMUSCULAR | Status: DC | PRN
  Administered 2015-08-11: 1 mL via INTRAVENOUS
  Administered 2015-08-11: 7 mL via INTRAVENOUS

## 2015-08-11 MED ORDER — ACETAMINOPHEN 325 MG PO TABS: 325.0000 mg | ORAL_TABLET | ORAL | Status: DC | PRN

## 2015-08-11 MED ORDER — FAMOTIDINE 20 MG PO TABS
ORAL_TABLET | ORAL | Status: AC
  Administered 2015-08-11: 20 mg via ORAL
  Filled 2015-08-11: qty 1

## 2015-08-11 MED ORDER — LORAZEPAM 0.5 MG PO TABS: 0.5000 mg | ORAL_TABLET | Freq: Four times a day (QID) | ORAL | Status: DC | PRN

## 2015-08-11 MED ORDER — CEFUROXIME SODIUM 1.5 G IJ SOLR
1.5000 g | Freq: Two times a day (BID) | INTRAMUSCULAR | Status: AC
Start: 2015-08-11 — End: 2015-08-12
  Administered 2015-08-11 – 2015-08-12 (×2): 1.5 g via INTRAVENOUS
  Filled 2015-08-11 (×2): qty 1.5

## 2015-08-11 MED ORDER — HEPARIN (PORCINE) IN NACL 2-0.9 UNIT/ML-% IJ SOLN
INTRAMUSCULAR | Status: AC
  Filled 2015-08-11: qty 1000

## 2015-08-11 MED ORDER — CEFAZOLIN SODIUM-DEXTROSE 2-3 GM-% IV SOLR
INTRAVENOUS | Status: AC
  Filled 2015-08-11: qty 50

## 2015-08-11 MED ORDER — LABETALOL HCL 5 MG/ML IV SOLN
INTRAVENOUS | Status: DC | PRN
  Administered 2015-08-11: 5 mg via INTRAVENOUS

## 2015-08-11 MED ORDER — NICOTINE 21 MG/24HR TD PT24
21.0000 mg | MEDICATED_PATCH | Freq: Every day | TRANSDERMAL | Status: DC
  Administered 2015-08-11 – 2015-08-16 (×6): 21 mg via TRANSDERMAL
  Filled 2015-08-11 (×6): qty 1

## 2015-08-11 MED ORDER — NITROGLYCERIN IN D5W 200-5 MCG/ML-% IV SOLN: 5.0000 ug/min | INTRAVENOUS | Status: DC

## 2015-08-11 MED ORDER — TIROFIBAN HCL IV 12.5 MG/250 ML
INTRAVENOUS | Status: AC
  Filled 2015-08-11: qty 250

## 2015-08-11 MED ORDER — ONDANSETRON HCL 4 MG/2ML IJ SOLN
INTRAMUSCULAR | Status: DC | PRN
  Administered 2015-08-11: 4 mg via INTRAVENOUS

## 2015-08-11 MED ORDER — PHENYLEPHRINE HCL 10 MG/ML IJ SOLN
INTRAMUSCULAR | Status: DC | PRN
  Administered 2015-08-11 (×2): 100 ug via INTRAVENOUS
  Administered 2015-08-11: 50 ug via INTRAVENOUS
  Administered 2015-08-11: 100 ug via INTRAVENOUS

## 2015-08-11 MED ORDER — POTASSIUM CHLORIDE CRYS ER 20 MEQ PO TBCR: 20.0000 meq | EXTENDED_RELEASE_TABLET | Freq: Every day | ORAL | Status: DC | PRN

## 2015-08-11 MED ORDER — PHENOL 1.4 % MT LIQD: 1.0000 | OROMUCOSAL | Status: DC | PRN

## 2015-08-11 MED ORDER — FAMOTIDINE IN NACL 20-0.9 MG/50ML-% IV SOLN
20.0000 mg | Freq: Two times a day (BID) | INTRAVENOUS | Status: DC
  Administered 2015-08-11 – 2015-08-14 (×6): 20 mg via INTRAVENOUS
  Filled 2015-08-11 (×8): qty 50

## 2015-08-11 MED ORDER — PHENYLEPHRINE HCL 10 MG/ML IJ SOLN
10.0000 mg | INTRAVENOUS | Status: DC | PRN
  Administered 2015-08-11: 20 ug/min via INTRAVENOUS

## 2015-08-11 MED ORDER — MAGNESIUM SULFATE 2 GM/50ML IV SOLN
2.0000 g | Freq: Every day | INTRAVENOUS | Status: DC | PRN
Start: 2015-08-11 — End: 2015-08-16
  Filled 2015-08-11: qty 50

## 2015-08-11 MED ORDER — FENTANYL CITRATE (PF) 100 MCG/2ML IJ SOLN
INTRAMUSCULAR | Status: AC
  Filled 2015-08-11: qty 2

## 2015-08-11 MED ORDER — ATORVASTATIN CALCIUM 20 MG PO TABS
80.0000 mg | ORAL_TABLET | Freq: Every day | ORAL | Status: DC
  Administered 2015-08-12 – 2015-08-15 (×4): 80 mg via ORAL
  Filled 2015-08-11 (×4): qty 4

## 2015-08-11 MED ORDER — ONDANSETRON HCL 4 MG/2ML IJ SOLN
4.0000 mg | Freq: Four times a day (QID) | INTRAMUSCULAR | Status: DC | PRN
  Administered 2015-08-11: 4 mg via INTRAVENOUS
  Filled 2015-08-11: qty 2

## 2015-08-11 MED ORDER — DOCUSATE SODIUM 100 MG PO CAPS
100.0000 mg | ORAL_CAPSULE | Freq: Every day | ORAL | Status: DC
  Administered 2015-08-13 – 2015-08-16 (×3): 100 mg via ORAL
  Filled 2015-08-11 (×4): qty 1

## 2015-08-11 MED ORDER — SODIUM CHLORIDE 0.9 % IV SOLN
INTRAVENOUS | Status: DC
  Administered 2015-08-11 – 2015-08-14 (×4): via INTRAVENOUS

## 2015-08-11 MED ORDER — CEFAZOLIN SODIUM 1-5 GM-% IV SOLN
INTRAVENOUS | Status: DC | PRN
  Administered 2015-08-11: 2 g via INTRAVENOUS

## 2015-08-11 MED ORDER — LIDOCAINE HCL (PF) 1 % IJ SOLN
INTRAMUSCULAR | Status: AC
  Filled 2015-08-11: qty 30

## 2015-08-11 SURGICAL SUPPLY — 21 items
BALLN ARMADA 8X40X135 (BALLOONS) ×3
BALLOON ARMADA 8X40X135 (BALLOONS) ×2 IMPLANT
CATH ANGIO PIGTAIL 5FR 100 (CATHETERS) ×2 IMPLANT
CATH BEACON 5 .038 100 JB1 TIP (CATHETERS) ×3 IMPLANT
CATH PIG 5.0X100 (CATHETERS) ×3
DEVICE EMBOSHIELD NAV6 4.0-7.0 (WIRE) ×3 IMPLANT
DEVICE PRESTO INFLATION (MISCELLANEOUS) ×3 IMPLANT
DEVICE STARCLOSE SE CLOSURE (Vascular Products) ×3 IMPLANT
DEVICE TORQUE (MISCELLANEOUS) ×3 IMPLANT
GLIDEWIRE ANGLED SS 035X260CM (WIRE) ×3 IMPLANT
PACK ANGIOGRAPHY (CUSTOM PROCEDURE TRAY) ×3 IMPLANT
SET INTRO CAPELLA COAXIAL (SET/KITS/TRAYS/PACK) ×3 IMPLANT
SHEATH BRITE TIP 5FRX11 (SHEATH) ×6 IMPLANT
SHEATH SHUTTLE 7FR (SHEATH) ×3 IMPLANT
SHEATH SHUTTLE SELECT 6F (SHEATH) ×3 IMPLANT
STENT VIABAHN 8X50X120 (Permanent Stent) ×3 IMPLANT
SYR MEDRAD MARK V 150ML (SYRINGE) ×3 IMPLANT
TUBING CONTRAST HIGH PRESS 72 (TUBING) ×3 IMPLANT
WIRE AMPLATZ SSTIFF .035X260CM (WIRE) ×3 IMPLANT
WIRE DOC EXTENSION .014X145CM (WIRE) ×3 IMPLANT
WIRE J 3MM .035X145CM (WIRE) ×3 IMPLANT

## 2015-08-11 SURGICAL SUPPLY — 62 items
APPLIER CLIP 11 MED OPEN (CLIP)
APPLIER CLIP 9.375 SM OPEN (CLIP) ×2
BAG DECANTER FOR FLEXI CONT (MISCELLANEOUS) ×2 IMPLANT
BLADE SURG 15 STRL LF DISP TIS (BLADE) ×1 IMPLANT
BLADE SURG 15 STRL SS (BLADE) ×1
BLADE SURG SZ11 CARB STEEL (BLADE) ×2 IMPLANT
BOOT SUTURE AID YELLOW STND (SUTURE) ×4 IMPLANT
BRUSH SCRUB 4% CHG (MISCELLANEOUS) ×2 IMPLANT
CANISTER SUCT 1200ML W/VALVE (MISCELLANEOUS) ×2 IMPLANT
CATH TRAY 16F METER LATEX (MISCELLANEOUS) ×2 IMPLANT
CLIP APPLIE 11 MED OPEN (CLIP) IMPLANT
CLIP APPLIE 9.375 SM OPEN (CLIP) ×1 IMPLANT
DRAPE INCISE IOBAN 66X45 STRL (DRAPES) ×2 IMPLANT
DRAPE LAPAROTOMY 100X77 ABD (DRAPES) ×2 IMPLANT
DRAPE SHEET LG 3/4 BI-LAMINATE (DRAPES) ×2 IMPLANT
DRESSING SURGICEL FIBRLLR 1X2 (HEMOSTASIS) ×1 IMPLANT
DRSG SURGICEL FIBRILLAR 1X2 (HEMOSTASIS) ×2
DURAPREP 26ML APPLICATOR (WOUND CARE) ×2 IMPLANT
ELECT CAUTERY BLADE 6.4 (BLADE) ×2 IMPLANT
EVICEL 2ML SEALANT HUMAN (Miscellaneous) ×2 IMPLANT
GLOVE SURG SYN 8.0 (GLOVE) ×10 IMPLANT
GOWN STRL REUS W/ TWL LRG LVL3 (GOWN DISPOSABLE) ×2 IMPLANT
GOWN STRL REUS W/ TWL XL LVL3 (GOWN DISPOSABLE) ×1 IMPLANT
GOWN STRL REUS W/TWL LRG LVL3 (GOWN DISPOSABLE) ×2
GOWN STRL REUS W/TWL XL LVL3 (GOWN DISPOSABLE) ×1
HEMOSTAT SURGICEL 2X3 (HEMOSTASIS) ×2 IMPLANT
IV NS 500ML (IV SOLUTION) ×1
IV NS 500ML BAXH (IV SOLUTION) ×1 IMPLANT
KIT RM TURNOVER STRD PROC AR (KITS) ×2 IMPLANT
LABEL OR SOLS (LABEL) ×2 IMPLANT
LIQUID BAND (GAUZE/BANDAGES/DRESSINGS) ×2 IMPLANT
LOOP RED MAXI  1X406MM (MISCELLANEOUS) ×2
LOOP VESSEL MAXI 1X406 RED (MISCELLANEOUS) ×2 IMPLANT
LOOP VESSEL MINI 0.8X406 BLUE (MISCELLANEOUS) ×1 IMPLANT
LOOPS BLUE MINI 0.8X406MM (MISCELLANEOUS) ×1
NEEDLE FILTER BLUNT 18X 1/2SAF (NEEDLE) ×1
NEEDLE FILTER BLUNT 18X1 1/2 (NEEDLE) ×1 IMPLANT
NEEDLE HYPO 25X1 1.5 SAFETY (NEEDLE) ×2 IMPLANT
NS IRRIG 500ML POUR BTL (IV SOLUTION) ×2 IMPLANT
PACK BASIN MAJOR ARMC (MISCELLANEOUS) ×2 IMPLANT
PAD GROUND ADULT SPLIT (MISCELLANEOUS) ×2 IMPLANT
PATCH CAROTID ECM VASC 1X10 (Prosthesis & Implant Heart) ×2 IMPLANT
PENCIL ELECTRO HAND CTR (MISCELLANEOUS) IMPLANT
SHUNT CAROTID PRUITT F3 T3103A (SHUNT) ×2 IMPLANT
SHUNT CAROTID STR REINF 3.0X4. (MISCELLANEOUS) ×2 IMPLANT
SHUNT W TPORT 9FR PRUITT F3 (SHUNT) ×2 IMPLANT
SUT MNCRL+ 5-0 UNDYED PC-3 (SUTURE) ×1 IMPLANT
SUT MONOCRYL 5-0 (SUTURE) ×1
SUT PROLENE 6 0 BV (SUTURE) ×30 IMPLANT
SUT PROLENE 7 0 BV 1 (SUTURE) ×14 IMPLANT
SUT SILK 2 0 (SUTURE) ×1
SUT SILK 2-0 18XBRD TIE 12 (SUTURE) ×1 IMPLANT
SUT SILK 3 0 (SUTURE) ×1
SUT SILK 3-0 18XBRD TIE 12 (SUTURE) ×1 IMPLANT
SUT SILK 4 0 (SUTURE) ×1
SUT SILK 4-0 18XBRD TIE 12 (SUTURE) ×1 IMPLANT
SUT VIC AB 3-0 SH 27 (SUTURE) ×1
SUT VIC AB 3-0 SH 27X BRD (SUTURE) ×1 IMPLANT
SYR 20CC LL (SYRINGE) ×2 IMPLANT
SYR 3ML LL SCALE MARK (SYRINGE) ×2 IMPLANT
SYRINGE 10CC LL (SYRINGE) IMPLANT
TUBING CONNECTING 10 (TUBING) IMPLANT

## 2015-08-11 NOTE — Op Note (Signed)
[facility]   OPERATIVE NOTE  PROCEDURE:   1.  left carotid endarterectomy with CorMatrix arterial patch reconstruction  PRE-OPERATIVE DIAGNOSIS: 1.  Critical left carotid stenosis 2.multi-infarct dementia  POST-OPERATIVE DIAGNOSIS: same as above   SURGEON: Katha Cabal, MD  ASSISTANT(S): Hezzie Bump  ANESTHESIA: general  ESTIMATED BLOOD LOSS: 500 cc  FINDING(S): 1.  Left carotid plaque.  SPECIMEN(S):  Carotid plaque (sent to Pathology)  INDICATIONS:   Jasmine Arias is a 80 y.o. female who presents with left carotid stenosis of 90%.  I discussed with the patient the risks, benefits, and alternatives to carotid endarterectomy.  I discussed the differences between carotid stenting and carotid endarterectomy. I discussed the procedural details of carotid endarterectomy with the patient.  The patient is aware that the risks of carotid endarterectomy include but are not limited to: bleeding, infection, stroke, myocardial infarction, death, cranial nerve injuries both temporary and permanent, neck hematoma, possible airway compromise, labile blood pressure post-operatively, cerebral hyperperfusion syndrome, and possible need for additional interventions in the future. The patient is aware of the risks and agrees to proceed forward with the procedure.  DESCRIPTION: After full informed written consent was obtained from the patient, the patient was brought back to the operating room and placed supine upon the operating table.  Prior to induction, the patient received IV antibiotics.  After obtaining adequate anesthesia, the patient was placed into a modified beach chair position with a shoulder roll in place and the patient's neck slightly hyperextended and rotated away from the surgical site.  The patient was prepped in the standard fashion for a carotid endarterectomy.  I made an incision anterior to the sternocleidomastoid muscle and dissected down through  the subcutaneous tissue.  The platysmas was opened with electrocautery.  Then I dissected down to the internal jugular vein and facial vein.  The facial vein is ligated and divided between 2-0 silk ties.  This was dissected posteriorly until I obtained visualization of the common carotid artery.  This was dissected out and then a vessel loop was placed around the common carotid artery.  I then dissected in a periadventitial fashion along the common carotid artery up to the bifurcation.  I then identified the external carotid artery and the superior thyroid artery.  I placed a vessel loop around the superior thyroid artery, and I also dissected out the external carotid artery and placed a vessel loop around it. In the process of this dissection, the hypoglossal nerve was identified and protected from harm.  I then dissected out the internal carotid artery until I identified an area in the internal carotid artery clearly above the stenosis.  I dissected slightly distal to this area, and placed a vessel loop around the artery.  At this point, we gave the patient 7000 units of intravenous heparin.  After this was allowed to circulate for several minutes, I pulled up control on the vessel loops to clamp the internal carotid artery, external carotid artery, superior thyroid artery, and then the common carotid artery.  I then made an arteriotomy in the common carotid artery with a 11 blade, and extended the arteriotomy with a Potts scissor down into the common carotid artery, then I carried the arteriotomy through the bifurcation into the internal carotid artery until I reached an area that was not diseased.  At this point, I took the [place] shunt that previously been prepared and I inserted it into the internal carotid artery first, and then into the  common carotid artery taking care to flush and de-air prior to release of control. At this point, I started the endarterectomy in the common carotid artery with a [place]  elevator and carried this dissection down into the common carotid artery circumferentially.  Then I transected the plaque at a segment where it was adherent and transected the plaque with Potts scissors.  I then carried this dissection up into the external carotid artery.  The plaque was extracted by unclamping the external carotid artery and performing an eversion endarterectomy.  The dissection was then carried into the internal carotid artery where a nice feathered end point was created with gentle traction.  I passed the plaque off the field as a specimen. At this point I removed all loose flecks and remaining disease possible.  At this point, I was satisfied that the minimal remaining disease was densely adherent to the wall and wall integrity was intact. The distal endpoint was tacked down with 4 7-0 Prolene sutures.  I then fashioned a CorMatrix arterial patch for the artery and sewed it in place with 5 running stitch of 6-0 Prolene.  I started at the distal endpoint and ran one half the length of the arteriotomy.  I then cut and beveled the patch to an appropriate length to match the arteriotomy.  Then I removed the shunt from the common carotid artery, from which there was excellent antegrade bleeding, and then clamped it.  At this point, I allowed the external carotid artery to backbleed, which was excellent.  Then I instilled heparinized saline in this patched artery and then completed the patch angioplasty in the usual fashion.  First, I released the clamp on the external carotid artery, then I released it on the common carotid artery.  After waiting a few seconds, I then released it on the internal carotid artery. Several minutes of pressure were held and 6-0 Prolene patch sutures were used as need for hemostasis.  At this point, I placed Surgicel and Evicel topical hemostatic agents.  There was no more active bleeding in the surgical site.  The sternocleidomastoid space was closed with three  interrupted 3-0 Vicryl sutures. I then reapproximated the platysma muscle with a running stitch of 3-0 Vicryl.  The skin was then closed with a running subcuticular 4-0 Monocryl.  The skin was then cleaned, dried and Dermabond was used to reinforce the skin closure.  The patient awakened and was taken to the recovery room in stable condition, following commands and moving all four extremities without any apparent deficits.    COMPLICATIONS: none  CONDITION: stable  Katha Cabal  08/11/2015, 11:27 AM

## 2015-08-11 NOTE — Progress Notes (Signed)
Post returning from CT scan, pt complaining of neck and chest hurting.  Pt is restless.  Remains in NSR, VSS.  98% SpO2 on room air.  Prn morphine given, notified Dr. Jannifer Franklin of pt complaint of chest pain.  Order for stat EKG and troponin per Dr. Jannifer Franklin.  Continue to monitor.

## 2015-08-11 NOTE — Anesthesia Preprocedure Evaluation (Signed)
Anesthesia Evaluation  Patient identified by MRN, date of birth, ID band Patient awake    Reviewed: Allergy & Precautions, NPO status , Patient's Chart, lab work & pertinent test results  Airway Mallampati: II  TM Distance: >3 FB Neck ROM: Limited    Dental  (+) Poor Dentition   Pulmonary COPD,  COPD inhaler, Current Smoker,    breath sounds clear to auscultation       Cardiovascular Exercise Tolerance: Poor hypertension, Pt. on medications + Peripheral Vascular Disease   Rhythm:Regular Rate:Normal     Neuro/Psych Vascular dementia.    GI/Hepatic   Endo/Other  diabetes, Type 2BG 131.  Renal/GU      Musculoskeletal   Abdominal (+)  Abdomen: soft.    Peds  Hematology   Anesthesia Other Findings   Reproductive/Obstetrics                             Anesthesia Physical Anesthesia Plan  ASA: III  Anesthesia Plan: General   Post-op Pain Management:    Induction: Intravenous  Airway Management Planned: Oral ETT  Additional Equipment: Arterial line  Intra-op Plan:   Post-operative Plan: Extubation in OR  Informed Consent: I have reviewed the patients History and Physical, chart, labs and discussed the procedure including the risks, benefits and alternatives for the proposed anesthesia with the patient or authorized representative who has indicated his/her understanding and acceptance.   Consent reviewed with POA  Plan Discussed with: CRNA  Anesthesia Plan Comments:         Anesthesia Quick Evaluation

## 2015-08-11 NOTE — Transfer of Care (Signed)
Immediate Anesthesia Transfer of Care Note  Patient: Jasmine Arias  Procedure(s) Performed: Procedure(s): ENDARTERECTOMY CAROTID (Left)  Patient Location: PACU  Anesthesia Type:General  Level of Consciousness: awake, alert  and oriented  Airway & Oxygen Therapy: Patient Spontanous Breathing and Patient connected to nasal cannula oxygen  Post-op Assessment: Report given to RN and Post -op Vital signs reviewed and stable  Post vital signs: Reviewed and stable  Last Vitals:  Filed Vitals:   08/11/15 0624  BP: 123/70  Pulse: 84  Temp: 36.9 C  Resp: 18    Complications: No apparent anesthesia complications

## 2015-08-11 NOTE — H&P (Signed)
[facility] History & Physical Update  The patient was interviewed and re-examined.  The patient's previous History and Physical has been reviewed and is unchanged.  There is no change in the plan of care. We plan to proceed with the scheduled procedure.  Abiola Behring, Dolores Lory, MD  08/11/2015, 7:13 AM

## 2015-08-11 NOTE — Anesthesia Postprocedure Evaluation (Signed)
  Anesthesia Post-op Note  Patient: Jasmine Arias  Procedure(s) Performed: Procedure(s): ENDARTERECTOMY CAROTID (Left)  Anesthesia type:General  Patient location: PACU  Post pain: Pain level controlled  Post assessment: Post-op Vital signs reviewed, Patient's Cardiovascular Status Stable, Respiratory Function Stable, Patent Airway and No signs of Nausea or vomiting  Post vital signs: Reviewed and stable  Last Vitals:  Filed Vitals:   08/11/15 1130  BP:   Pulse: 69  Temp:   Resp: 20    Level of consciousness: awake, alert  and patient cooperative  Complications: No apparent anesthesia complications

## 2015-08-11 NOTE — OR Nursing (Signed)
Pt transferred from PACU to vascular lab due to urgent need. See MD note.   Dr Delana Meyer attempted to reach husband via phone with no answer.  Nursing attempted to call the husband, left message for husband on voicemail.

## 2015-08-11 NOTE — Progress Notes (Signed)
Report called to nurse staci, pt alert oriented to name and place, carotid incision left neck cdi, right groin w/ PAD in place no bleeding or hematoma, foley draining clear yellow urine, rr skin w/d, oxygen 2 liters [place] frequent np cough noted md aware, LR 35ml/hr RPIV, NS kvo LPIV, left radial a-line cdi.

## 2015-08-11 NOTE — Progress Notes (Signed)
   08/11/15 1541  Clinical Encounter Type  Visited With Patient and family together  Visit Type Initial  Referral From Nurse  Consult/Referral To Chaplain  Spiritual Encounters  Spiritual Needs Emotional  Stress Factors  Patient Stress Factors None identified  Family Stress Factors Exhausted  Chaplain rounded in the unit and offered assistance as the nurse sought to get assistance for the patient's family member. Rolled family member down to the first floor parking area so he would not have to walk so far. Chaplain Marice Guidone A. Karigan Cloninger Ext. [phone]

## 2015-08-11 NOTE — Progress Notes (Signed)
Patient has a small amount of bleeding at PAD site. Dressing removed by Jule Ser, RN and new PAD dressing appleied by her. Encouraged patient to keep her leg still.

## 2015-08-11 NOTE — Progress Notes (Signed)
S:  Patient alert and appropriate   O: unable to move the right arm  Right leg with 5/5 strength left side normal       Neck clean dry and intact  A/P  S/p left CEA with post op neurologic event  S/p stenting of the proximal left common carotid           Continue Aggrastat until am then antiplatelet therapy

## 2015-08-11 NOTE — Consult Note (Signed)
[place] [facility] [place] NAME: Jasmine Arias    MR#:  [id number]  DATE OF BIRTH:  [birth year]  DATE OF ADMISSION:  08/11/2015  PRIMARY CARE PHYSICIAN: Park Liter, DO   REQUESTING/REFERRING PHYSICIAN: Dr. Hortencia Pilar  CHIEF COMPLAINT:  No chief complaint on file.   HISTORY OF PRESENT ILLNESS:  Jasmine Arias  is a 80 y.o. female with a known history of hip retention, hyperlipidemia, diabetes, dementia, COPD, ongoing smoking, known bilateral carotid artery stenosis presented to the hospital secondary to a syncopal episode last week. She was seen by vascular and got admitted today for an elective left carotid endarterectomy for 90% critical stenosis. The stent procedure patient had right arm weakness while in the recovery room and a stat CT angiogram showed a dissection and proximal left common carotid artery and so had another procedure endovascular stent was placed in the common carotid artery. Medical consult requested for medical management. Patient is restless and confused in the ICU. At times has some expressive aphasia but not sure if it is from confusion or from stroke. She does have right arm weakness which is new after the procedure. Her vitals are otherwise stable.   PAST MEDICAL HISTORY:   Past Medical History  Diagnosis Date  . Hyperlipidemia   . Hypertension   . Diabetes mellitus without complication ([place])   . Memory changes   . COPD (chronic obstructive pulmonary disease) ([place])   . Anxiety   . Indigestion     PAST SURGICAL HISTOIRY:   Past Surgical History  Procedure Laterality Date  . Abdominal hysterectomy      heavy bleeding  . Endarterectomy Left 08/11/2015    Procedure: ENDARTERECTOMY CAROTID;  Surgeon: Katha Cabal, MD;  Location: [facility] ORS;  Service: Vascular;  Laterality: Left;    SOCIAL HISTORY:   Social History  Substance Use Topics  . Smoking status: Current Every Day Smoker -- 1.00  packs/day  . Smokeless tobacco: Never Used  . Alcohol Use: No    FAMILY HISTORY:   Family History  Problem Relation Age of Onset  . Hypertension Mother   . Hypertension Father   . Heart disease Sister   . Hypertension Brother     DRUG ALLERGIES:  No Known Allergies  REVIEW OF SYSTEMS:   Review of Systems  Unable to perform ROS: critical illness    MEDICATIONS AT HOME:   Prior to Admission medications   Medication Sig Start Date End Date Taking? Authorizing Provider  atorvastatin (LIPITOR) 20 MG tablet  04/11/15  Yes Historical Provider, MD  donepezil (ARICEPT) 10 MG tablet Take 1 tablet (10 mg total) by mouth at bedtime. 05/11/15  Yes Megan P Johnson, DO  Fluticasone-Salmeterol (ADVAIR) 100-50 MCG/DOSE AEPB Inhale 1 puff into the lungs 2 (two) times daily. 06/12/15  Yes Megan P Johnson, DO  hydrochlorothiazide (HYDRODIURIL) 25 MG tablet Take 1 tablet (25 mg total) by mouth daily. 06/12/15  Yes Megan P Johnson, DO  metFORMIN (GLUCOPHAGE) 500 MG tablet Take 500 mg by mouth 2 (two) times daily with a meal.  02/08/15  Yes Historical Provider, MD  Nutritional Supplements (ENSURE NUTRITION SHAKE) LIQD Take 8 oz by mouth 2 (two) times daily. 05/11/15  Yes Megan P Johnson, DO  sertraline (ZOLOFT) 50 MG tablet Take 1 tablet (50 mg total) by mouth at bedtime. 05/11/15  Yes Megan P Johnson, DO      VITAL SIGNS:  Blood pressure 144/58, pulse 70, temperature 97.1 F (  36.2 C), temperature source Oral, resp. rate 19, height 5' 1.5" (1.562 m), weight 52.6 kg (115 lb 15.4 oz), SpO2 100 %.  PHYSICAL EXAMINATION:   Physical Exam  GENERAL:  80 y.o.-year-old patient lying in the bed with no acute distress. Very restless, confused in bed. EYES: Pupils equal, round, reactive to light and accommodation. No scleral icterus. Extraocular muscles intact.  HEENT: Head atraumatic, normocephalic. Oropharynx and nasopharynx clear.  NECK:  Supple, no jugular venous distention. No thyroid enlargement.  Surgical scar- clean noted on left side of her neck. LUNGS: Normal breath sounds bilaterally, no wheezing, rales,rhonchi or crepitation. No use of accessory muscles of respiration. Decreased bibasilar breath sounds CARDIOVASCULAR: S1, S2 normal. No  rubs, or gallops. 3/6 systolic murmur heard ABDOMEN: Soft, nontender, nondistended. Bowel sounds present. No organomegaly or mass.  EXTREMITIES: No pedal edema, cyanosis, or clubbing.  NEUROLOGIC: Cranial nerves II through XII are intact. Except some expressive aphasia.  Muscle strength 5/5 in  Both left upper and left lower and right lower extremities. Right upper extremity is 2/5, flaccid. Sensation intact. Gait not checked.  PSYCHIATRIC: The patient is alert and oriented x 2 but expressive aphasia present and confused too.  SKIN: No obvious rash, lesion, or ulcer.   LABORATORY PANEL:   CBC No results for input(s): WBC, HGB, HCT, PLT in the last 168 hours. ------------------------------------------------------------------------------------------------------------------  Chemistries  No results for input(s): NA, K, CL, CO2, GLUCOSE, BUN, CREATININE, CALCIUM, MG, AST, ALT, ALKPHOS, BILITOT in the last 168 hours.  Invalid input(s): GFRCGP ------------------------------------------------------------------------------------------------------------------  Cardiac Enzymes No results for input(s): TROPONINI in the last 168 hours. ------------------------------------------------------------------------------------------------------------------  RADIOLOGY:  Ct Angio Neck W/cm &/or Wo/cm  08/11/2015  CLINICAL DATA:  Left carotid endarterectomy today. Right arm weakness EXAM: CT ANGIOGRAPHY NECK TECHNIQUE: Multidetector CT imaging of the neck was performed using the standard protocol during bolus administration of intravenous contrast. Multiplanar CT image reconstructions and MIPs were obtained to evaluate the vascular anatomy. Carotid stenosis  measurements (when applicable) are obtained utilizing NASCET criteria, using the distal internal carotid diameter as the denominator. CONTRAST:  40mL OMNIPAQUE IOHEXOL 350 MG/ML SOLN COMPARISON:  CTA neck 12/20/2014.  MR head 08/02/2015 FINDINGS: Aortic arch: Extensive atherosclerotic calcification throughout the aortic arch without aneurysm or dissection. Calcified plaque in the innominate artery, right subclavian artery, and proximal right common carotid artery. Atherosclerotic calcified plaque at the origin of the left common carotid artery and left subclavian. Atherosclerotic disease throughout the left subclavian artery. Right carotid system: Calcified plaque in the mid right common carotid artery. Heavily calcified plaque at the right carotid bifurcation. Estimated 70% diameter stenosis of the right internal carotid artery. Mild to moderate stenosis origin of the right external carotid artery. Left carotid system: Postop left carotid endarterectomy with gas in the soft tissues of the left neck. No hematoma. Multiple small filling defects are present in the left common carotid, left internal carotid, and left external carotid arteries consistent with thrombus. There is a linear filling defect in the mid left common carotid artery which is likely a dissection. There is thrombus just above this dissection. These are nonocclusive. Left external carotid artery patent with atherosclerotic calcified plaque 1 cm distal to the origin. Vertebral arteries:Both vertebral arteries patent to the basilar. Atherosclerotic calcification at the origin of the right vertebral artery. Both vertebral arteries are patent to the basilar. Mild stenosis distal left vertebral artery at the C1 level. Calcified plaque with moderate stenosis mid right vertebral artery. Skeleton: Moderate spondylosis and spinal stenosis  C5-6 and C6-7. No acute skeletal abnormality. Other neck: Enhancing mass left parotid gland extending close to the deep  lobe is unchanged from prior studies. Mass measures 18 x 22 mm. 5 mm enhancing nodule in the right parotid tail also unchanged. Lung apices clear. IMPRESSION: Postop left carotid endarterectomy today with gas in the soft tissues. 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 These are nonocclusive and are a source of emboli causing stroke. CTA head not performed day. Atherosclerotic stenosis of the right carotid bifurcation with 70% diameter stenosis right internal carotid artery. 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 5 mm enhancing nodule right parotid tail also unchanged. Critical Value/emergent results were called by telephone at the time of interpretation on 08/11/2015 at 12:50 Pm to Dr. Hortencia Pilar , who verbally acknowledged these results. Electronically Signed   By: Franchot Gallo M.D.   On: 08/11/2015 13:10    EKG:   Orders placed or performed during the hospital encounter of 08/02/15  . ED EKG  . ED EKG  . ED EKG  . ED EKG  . EKG    IMPRESSION AND PLAN:   Javiona Baich  is a 80 y.o. female with a known history of hip retention, hyperlipidemia, diabetes, dementia, COPD, ongoing smoking, known bilateral carotid artery stenosis presented to the hospital secondary to a syncopal episode last week . She was seen by vascular and got admitted today for an elective left carotid endarterectomy for 90% critical stenosis.  post procedure today patient had new onset right arm weakness.  #1 acute CVA-post left carotid endarterectomy with right arm weakness. -had left common carotid artery dissection post stent placement. -Some speech changes noted with expressive aphasia not should've secondary to confusion or from the stroke. -CT head  ordered for now. Neurology consult. -On aspirin and Plavix, continue statin. Smoking cessation to be advised -Sitter at bedside for confusion. Neuro checks. -Physical and occupational therapy consults  #2 Left parotid mass-incidental note of left P rotated possible neoplastic mass noted on CT angios today. However her previous CT angiogram of the neck from March 2016 also showing the same mass. -This can be worked up later when she is stable by her PCP   #3 hypertension- with her dissection and an new stroke blood pressure needs to be better controlled. -On oral hydrochlorothiazide. -Also on labetalol IV and hydralazine IV when necessary  #4 carotid endarterectomy- status post left carotid endarterectomy for critical 90% stenosis with syncope. Postoperative day 0 today. -Management per vascular team. Postoperative common carotid artery dissection noted and a stent placed in.  -On Aggrastat drip for now   #5 tobacco use disorder-will start on nicotine patch.   #6 dementia -following with Dr. Manuella Ghazi as an outpatient. Recent outpatient MRI last month showing chronic microvascular ischemic changes. She was started on Aricept as outpatient  #7 DVT prophylaxis-on Aggrastat drip for now   All the records are reviewed and case discussed with Consulting provider. Management plans discussed with the patient, family and they are in agreement.  CODE STATUS: Full Code  TOTAL CRITICAL CARE TIME SPENT IN TAKING CARE OF THIS PATIENT: 50 minutes.    Gladstone Lighter M.D on 08/11/2015 at 6:42 PM  Between 7am to 6pm - Pager - [phone]  After 6pm go to  [email or web address] - password [email or web address] [facility]  Office  [phone]  CC: Primary care Physician: Park Liter, DO

## 2015-08-11 NOTE — Op Note (Signed)
    OPERATIVE NOTE   PROCEDURE: 1.  ultrasound guidance for vascular access right femoral artery 2.  Placement of a 8 x 50 Viabahn stent with the use of the NAV-6 embolic protection device in the left common carotid artery  PRE-OPERATIVE DIAGNOSIS: 1. Left common carotid artery dissection associated with thrombus 2. Status post left carotid endarterectomy with core matrix patch secondary to atherosclerotic occlusive disease  POST-OPERATIVE DIAGNOSIS:  Same as above  SURGEON: Katha Cabal, MD  ASSISTANT(S):  Algernon Huxley, M.D.   ANESTHESIA: local  ESTIMATED BLOOD LOSS:  100 cc  FINDING(S): 1.   Left common carotid artery dissection  SPECIMEN(S):   none  INDICATIONS:   Patient is a 80 year old woman who presents with  right arm weakness approximately one hour following left carotid endarterectomy for symptomatic carotid artery stenosis.  The patient has a CT angiogram which shows a dissection in the proximal left common carotid and therefore stenting was felt to be preferred to further surgery for that reason.  Risks and benefits were discussed and informed consent was obtained.   DESCRIPTION: After obtaining full informed written consent, the patient was brought back to the vascular suite and placed supine upon the table.  The patient received IV antibiotics prior to induction.  After obtaining adequate anesthesia, the patient was prepped and draped in the standard fashion.   The right femoral artery was visualized with ultrasound and found to be widely patent. It was then accessed under direct ultrasound guidance without difficulty with a Seldinger needle. A permanent image was recorded. A J-wire was placed and we then placed a 6 French sheath. The patient was then heparinized and a total of 3000 units of intravenous heparin were given and an ACT was checked to confirm successful anticoagulation. A pigtail catheter was then placed into the ascending aorta. This showed normal large  with a 40-50% ostial stenosis of the great vessels, all 3. I then selectively cannulated the left common carotid without difficulty with a Glidewire and advanced the 6 [place] shuttle sheath into the proximal left common carotid artery.  Cervical and cerebral carotid angiography was then performed. There were no obvious intracranial filling defects. The common carotid demonstrated a dissection associated with thrombus as seen on the CT angiogram.  Amplatz Super Stiff wire was then advanced under fluoroscopic guidance and the 6 [place] shuttle sheath was exchanged for a 7 [place] shuttle sheath. I then used the NAV-6  Embolic protection device and crossed the lesion and parked this in the distal internal carotid artery at the base of the skull.  I then selected a 8 x 50 Viabahn. This was deployed across the lesion encompassing it in its entirety. Only about a less than 5% % residual stenosis was present after stent placement. Completion angiogram showed normal intracranial filling without new defects. At this point I elected to terminate the procedure. The sheath was removed and StarClose closure device was deployed in the left femoral artery with excellent hemostatic result. The patient was taken to the recovery room in stable condition having tolerated the procedure well.  COMPLICATIONS: none  CONDITION: stable  Katha Cabal 08/11/2015 4:36 PM

## 2015-08-11 NOTE — Progress Notes (Signed)
The patient has developed right arm weakness following left CEA.  CT angiosuggests a dissection with thrombus in the common below the repair  I believe that angiography and intervention is imperative and will move forward with the procedure.  If I delay I feel she is at great risk for worsening of her CVA and death.  I have attempted to contact the husband by the number which he left but have not gotten and answer.  This is an Emergency and I am moving forward

## 2015-08-12 ENCOUNTER — Inpatient Hospital Stay: Payer: Medicare Other

## 2015-08-12 DIAGNOSIS — I63442 Cerebral infarction due to embolism of left cerebellar artery: Secondary | ICD-10-CM

## 2015-08-12 LAB — GLUCOSE, CAPILLARY
GLUCOSE-CAPILLARY: 125 mg/dL — AB (ref 65–99)
Glucose-Capillary: 185 mg/dL — ABNORMAL HIGH (ref 65–99)

## 2015-08-12 LAB — CBC
HEMATOCRIT: 26.6 % — AB (ref 35.0–47.0)
Hemoglobin: 8.8 g/dL — ABNORMAL LOW (ref 12.0–16.0)
MCH: 29.7 pg (ref 26.0–34.0)
MCHC: 33 g/dL (ref 32.0–36.0)
MCV: 89.9 fL (ref 80.0–100.0)
PLATELETS: 202 10*3/uL (ref 150–440)
RBC: 2.96 MIL/uL — ABNORMAL LOW (ref 3.80–5.20)
RDW: 13.9 % (ref 11.5–14.5)
WBC: 13.5 10*3/uL — AB (ref 3.6–11.0)

## 2015-08-12 LAB — BASIC METABOLIC PANEL
ANION GAP: 7 (ref 5–15)
BUN: 14 mg/dL (ref 6–20)
CALCIUM: 7.5 mg/dL — AB (ref 8.9–10.3)
CO2: 24 mmol/L (ref 22–32)
CREATININE: 0.82 mg/dL (ref 0.44–1.00)
Chloride: 109 mmol/L (ref 101–111)
Glucose, Bld: 188 mg/dL — ABNORMAL HIGH (ref 65–99)
Potassium: 3.6 mmol/L (ref 3.5–5.1)
SODIUM: 140 mmol/L (ref 135–145)

## 2015-08-12 LAB — TROPONIN I

## 2015-08-12 LAB — MAGNESIUM: MAGNESIUM: 1.2 mg/dL — AB (ref 1.7–2.4)

## 2015-08-12 LAB — HEMOGLOBIN: HEMOGLOBIN: 9.1 g/dL — AB (ref 12.0–16.0)

## 2015-08-12 MED ORDER — CETYLPYRIDINIUM CHLORIDE 0.05 % MT LIQD
7.0000 mL | Freq: Two times a day (BID) | OROMUCOSAL | Status: DC
  Administered 2015-08-12 (×2): 7 mL via OROMUCOSAL

## 2015-08-12 MED ORDER — PNEUMOCOCCAL VAC POLYVALENT 25 MCG/0.5ML IJ INJ
0.5000 mL | INJECTION | INTRAMUSCULAR | Status: AC
  Administered 2015-08-13: 0.5 mL via INTRAMUSCULAR
  Filled 2015-08-12: qty 0.5

## 2015-08-12 MED ORDER — CHLORHEXIDINE GLUCONATE 0.12 % MT SOLN
15.0000 mL | Freq: Two times a day (BID) | OROMUCOSAL | Status: DC
  Administered 2015-08-12 – 2015-08-16 (×7): 15 mL via OROMUCOSAL
  Filled 2015-08-12 (×5): qty 15

## 2015-08-12 MED ORDER — MAGNESIUM SULFATE 4 GM/100ML IV SOLN
4.0000 g | Freq: Once | INTRAVENOUS | Status: AC
  Administered 2015-08-12: 4 g via INTRAVENOUS
  Filled 2015-08-12: qty 100

## 2015-08-12 MED ORDER — CETYLPYRIDINIUM CHLORIDE 0.05 % MT LIQD
7.0000 mL | Freq: Two times a day (BID) | OROMUCOSAL | Status: DC
  Administered 2015-08-13 – 2015-08-16 (×6): 7 mL via OROMUCOSAL

## 2015-08-12 NOTE — Progress Notes (Signed)
Pt had episode when she became hypotensive (sbp 70's per A-line which corretlated with cuff pressure).  Pt was sinus brady, hr 55.  Pt has intermittent episodes of restlessness.  Pt was placed on dopamine drip per order parameters to keep sbp >100.  Have been able to titrate back to 1.5 mcg dopamine.  Spoke with Dr. Jannifer Franklin my concern of having to start vasopressors as pt sbp was 120's previously for most of shift.  Earlier today pt was having some bleeding from right femoral site.  Bleeding has subsided for my shift.  Per Dr. Jannifer Franklin, order for stat hemoglobin to see if pt bleeding (possible retroperitoneal).

## 2015-08-12 NOTE — Consult Note (Signed)
CC: R sided weakness   HPI: Jasmine Arias is an 80 y.o. female with past medical history significant for history of hypertension, hyperlipidemia, diabetes, dementia, COPD, ongoing tobacco abuse, also history of bilateral carotid artery stenosis who presents to the hospital due to syncope and was found to have left carotid stenosis of 90%. Patient underwent left carotid stenting after which patient developed right upper extremity weakness, so CT angiogram revealed dissection of proximal left common carotid artery, so another endovascular procedure was performed and stent was placed  Pt was found to have R sided weakness that has slightly improved.   Past Medical History  Diagnosis Date  . Hyperlipidemia   . Hypertension   . Diabetes mellitus without complication ([place])   . Memory changes   . COPD (chronic obstructive pulmonary disease) ([place])   . Anxiety   . Indigestion     Past Surgical History  Procedure Laterality Date  . Abdominal hysterectomy      heavy bleeding  . Endarterectomy Left 08/11/2015    Procedure: ENDARTERECTOMY CAROTID;  Surgeon: Katha Cabal, MD;  Location: [facility] ORS;  Service: Vascular;  Laterality: Left;    Family History  Problem Relation Age of Onset  . Hypertension Mother   . Hypertension Father   . Heart disease Sister   . Hypertension Brother     Social History:  reports that she has been smoking.  She has never used smokeless tobacco. She reports that she does not drink alcohol or use illicit drugs.  No Known Allergies  Medications: I have reviewed the patient's current medications.  ROS: History obtained from the patient  General ROS: negative for - chills, fatigue, fever, night sweats, weight gain or weight loss Psychological ROS: negative for - behavioral disorder, hallucinations, memory difficulties, mood swings or suicidal ideation Ophthalmic ROS: negative for - blurry vision, double vision, eye Arias or loss of vision ENT ROS:  negative for - epistaxis, nasal discharge, oral lesions, sore throat, tinnitus or vertigo Allergy and Immunology ROS: negative for - hives or itchy/watery eyes Hematological and Lymphatic ROS: negative for - bleeding problems, bruising or swollen lymph nodes Endocrine ROS: negative for - galactorrhea, hair pattern changes, polydipsia/polyuria or temperature intolerance Respiratory ROS: negative for - cough, hemoptysis, shortness of breath or wheezing Cardiovascular ROS: negative for - chest Arias, dyspnea on exertion, edema or irregular heartbeat Gastrointestinal ROS: negative for - abdominal Arias, diarrhea, hematemesis, nausea/vomiting or stool incontinence Genito-Urinary ROS: negative for - dysuria, hematuria, incontinence or urinary frequency/urgency Musculoskeletal ROS: negative for - joint swelling or muscular weakness Neurological ROS: as noted in HPI Dermatological ROS: negative for rash and skin lesion changes  Physical Examination: Blood pressure 140/52, pulse 84, temperature 98.9 F (37.2 C), temperature source Oral, resp. rate 21, height 5' 1.5" (1.562 m), weight 127 lb 10.3 oz (57.9 kg), SpO2 100 %.   Neurological Examination Mental Status: Alert, oriented to name  Cranial Nerves: II: Discs flat bilaterally; Visual fields grossly normal, pupils equal, round, reactive to light and accommodation III,IV, VI: ptosis not present, extra-ocular motions intact bilaterally V,VII: smile symmetric, facial light touch sensation normal bilaterally VIII: hearing normal bilaterally IX,X: gag reflex present XI: bilateral shoulder shrug XII: midline tongue extension Motor: Right : Upper extremity   1/5    Left:     Upper extremity   5/5  Lower extremity   4/5     Lower extremity   5/5 Tone and bulk:normal tone throughout; no atrophy noted Sensory: Pinprick and light  touch intact throughout, bilaterally Deep Tendon Reflexes: 1+ and symmetric throughout Plantars: Right: downgoing   Left:  downgoing Cerebellar: normal finger-to-nose, normal rapid alternating movements and normal heel-to-shin test Gait: not tested      Laboratory Studies:   Basic Metabolic Panel:  Recent Labs Lab 08/11/15 1941 08/12/15 0534 08/12/15 1121  NA 139 140  --   K 3.7 3.6  --   CL 109 109  --   CO2 25 24  --   GLUCOSE 175* 188*  --   BUN 14 14  --   CREATININE 0.82 0.82  --   CALCIUM 7.6* 7.5*  --   MG  --   --  1.2*    Liver Function Tests:  Recent Labs Lab 08/11/15 1941  AST 24  ALT 19  ALKPHOS 43  BILITOT <0.1*  PROT 5.5*  ALBUMIN 2.7*   No results for input(s): LIPASE, AMYLASE in the last 168 hours. No results for input(s): AMMONIA in the last 168 hours.  CBC:  Recent Labs Lab 08/11/15 1941 08/12/15 0028 08/12/15 0534  WBC 13.3*  --  13.5*  HGB 9.7* 9.1* 8.8*  HCT 29.3*  --  26.6*  MCV 88.8  --  89.9  PLT 219  --  202    Cardiac Enzymes:  Recent Labs Lab 08/11/15 2255 08/12/15 0534 08/12/15 1121  TROPONINI <0.03 <0.03 <0.03    BNP: Invalid input(s): POCBNP  CBG:  Recent Labs Lab 08/11/15 0607 08/11/15 1122 08/12/15 0744 08/12/15 1119  GLUCAP 131* 166* 185* 125*    Microbiology: Results for orders placed or performed during the hospital encounter of 08/11/15  MRSA PCR Screening     Status: None   Collection Time: 08/11/15  5:49 PM  Result Value Ref Range Status   MRSA by PCR NEGATIVE NEGATIVE Final    Comment:        The GeneXpert MRSA Assay (FDA approved for NASAL specimens only), is one component of a comprehensive MRSA colonization surveillance program. It is not intended to diagnose MRSA infection nor to guide or monitor treatment for MRSA infections.     Coagulation Studies:  Recent Labs  08/11/15 1941  LABPROT 15.6*  INR 1.22    Urinalysis: No results for input(s): COLORURINE, LABSPEC, PHURINE, GLUCOSEU, HGBUR, BILIRUBINUR, KETONESUR, PROTEINUR, UROBILINOGEN, NITRITE, LEUKOCYTESUR in the last 168  hours.  Invalid input(s): APPERANCEUR  Lipid Panel:     Component Value Date/Time   CHOL 143 08/11/2015 1941   TRIG 72 08/11/2015 1941   HDL 41 08/11/2015 1941   CHOLHDL 3.5 08/11/2015 1941   VLDL 14 08/11/2015 1941   LDLCALC 88 08/11/2015 1941    HgbA1C: No results found for: HGBA1C  Urine Drug Screen:  No results found for: LABOPIA, COCAINSCRNUR, LABBENZ, AMPHETMU, THCU, LABBARB  Alcohol Level: No results for input(s): ETH in the last 168 hours.  Other results: EKG: normal EKG, normal sinus rhythm, unchanged from previous tracings.  Imaging: Ct Head Wo Contrast  08/11/2015  CLINICAL DATA:  Initial evaluation for expressive aphasia, history of recent carotid endarterectomy and stent placement EXAM: CT HEAD WITHOUT CONTRAST TECHNIQUE: Contiguous axial images were obtained from the base of the skull through the vertex without intravenous contrast. COMPARISON:  Prior CT from 08/02/2015. FINDINGS: Generalized cerebral atrophy with chronic microvascular ischemic changes again seen, stable. No acute large vessel territory infarct. No intracranial hemorrhage. No mass lesion, midline shift, or mass effect. No extra-axial fluid collection. Scattered vascular calcifications within the carotid siphons. Scalp soft  tissues demonstrate no acute abnormality. No acute abnormality about the orbits. Calvarium intact. Left frontal osteoma again noted. Small mild layering opacity within the left sphenoid sinus. Paranasal sinuses are otherwise clear. No mastoid effusion. IMPRESSION: 1. No acute intracranial process. 2. Moderate generalized atrophy with chronic small vessel ischemic disease. Electronically Signed   By: Jeannine Boga M.D.   On: 08/11/2015 22:30   Ct Angio Neck W/cm &/or Wo/cm  08/11/2015  CLINICAL DATA:  Left carotid endarterectomy today. Right arm weakness EXAM: CT ANGIOGRAPHY NECK TECHNIQUE: Multidetector CT imaging of the neck was performed using the standard protocol during bolus  administration of intravenous contrast. Multiplanar CT image reconstructions and MIPs were obtained to evaluate the vascular anatomy. Carotid stenosis measurements (when applicable) are obtained utilizing NASCET criteria, using the distal internal carotid diameter as the denominator. CONTRAST:  38mL OMNIPAQUE IOHEXOL 350 MG/ML SOLN COMPARISON:  CTA neck 12/20/2014.  MR head 08/02/2015 FINDINGS: Aortic arch: Extensive atherosclerotic calcification throughout the aortic arch without aneurysm or dissection. Calcified plaque in the innominate artery, right subclavian artery, and proximal right common carotid artery. Atherosclerotic calcified plaque at the origin of the left common carotid artery and left subclavian. Atherosclerotic disease throughout the left subclavian artery. Right carotid system: Calcified plaque in the mid right common carotid artery. Heavily calcified plaque at the right carotid bifurcation. Estimated 70% diameter stenosis of the right internal carotid artery. Mild to moderate stenosis origin of the right external carotid artery. Left carotid system: Postop left carotid endarterectomy with gas in the soft tissues of the left neck. No hematoma. Multiple small filling defects are present in the left common carotid, left internal carotid, and left external carotid arteries consistent with thrombus. There is a linear filling defect in the mid left common carotid artery which is likely a dissection. There is thrombus just above this dissection. These are nonocclusive. Left external carotid artery patent with atherosclerotic calcified plaque 1 cm distal to the origin. Vertebral arteries:Both vertebral arteries patent to the basilar. Atherosclerotic calcification at the origin of the right vertebral artery. Both vertebral arteries are patent to the basilar. Mild stenosis distal left vertebral artery at the C1 level. Calcified plaque with moderate stenosis mid right vertebral artery. Skeleton: Moderate  spondylosis and spinal stenosis C5-6 and C6-7. No acute skeletal abnormality. Other neck: Enhancing mass left parotid gland extending close to the deep lobe is unchanged from prior studies. Mass measures 18 x 22 mm. 5 mm enhancing nodule in the right parotid tail also unchanged. Lung apices clear. IMPRESSION: Postop left carotid endarterectomy today with gas in the soft tissues. 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 These are nonocclusive and are a source of emboli causing stroke. CTA head not performed day. Atherosclerotic stenosis of the right carotid bifurcation with 70% diameter stenosis right internal carotid artery. 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 5 mm enhancing nodule right parotid tail also unchanged. Critical Value/emergent results were called by telephone at the time of interpretation on 08/11/2015 at 12:50 Pm to Dr. Hortencia Pilar , who verbally acknowledged these results. Electronically Signed   By: Franchot Gallo M.D.   On: 08/11/2015 13:10   Mr Brain Wo Contrast  08/12/2015  CLINICAL DATA:  Recent LEFT carotid endarterectomy, requiring postoperative stent placement due to dissection  with thrombus. Difficulty speaking, decreased level of consciousness. RIGHT hemiparesis. Initial encounter. EXAM: MRI HEAD WITHOUT CONTRAST TECHNIQUE: Multiplanar, multiecho pulse sequences of the brain and surrounding structures were obtained without intravenous contrast. COMPARISON:  CT angiography of the neck 08/11/2015. CT head 08/11/2015. Preoperative MRI brain 07/12/2015. FINDINGS: Multifocal areas of restricted diffusion, representing acute nonhemorrhagic infarction, affect the LEFT hemisphere. All lie within the LEFT MCA distribution. These  include the LEFT posterior frontal cortex, LEFT parietal cortex, and adjacent subcortical white matter. Additional area of acute infarction affects the LEFT external capsule just lateral and inferior to the lentiform nucleus. Equivocal tiny areas of acute infarction affect the LEFT lateral occipital lobe and LEFT periventricular white matter. No abnormalities in the RIGHT hemisphere, brainstem, or cerebellum. Generalized atrophy. Moderate T2 and FLAIR hyperintensities throughout the periventricular and subcortical white matter representing chronic microvascular ischemic change. No acute hemorrhage is observed. No mass lesion, or extra-axial fluid. Flow voids are maintained in the internal carotid arteries, basilar artery, both vertebral arteries. Specifically the LEFT cervical ICA is patent. In addition, there is no evidence for proximal LEFT middle cerebral artery disease. Unremarkable pituitary and cerebellar tonsils. Upper cervical region unremarkable. No sinus or mastoid disease. Soft tissue swelling in the LEFT neck. IMPRESSION: Multifocal areas of acute, nonhemorrhagic, LEFT hemisphere MCA territory infarction as described. Patency of the LEFT ICA and MCA is established. Atrophy and small vessel disease. Electronically Signed   By: Staci Righter M.D.   On: 08/12/2015 17:08     Assessment/Plan:  80 y.o. female with past medical history significant for history of hypertension, hyperlipidemia, diabetes, dementia, COPD, ongoing tobacco abuse, also history of bilateral carotid artery stenosis who presents to the hospital due to syncope and was found to have left carotid stenosis of 90%. Patient underwent left carotid stenting after which patient developed right upper extremity weakness, so CT angiogram revealed dissection of proximal left common carotid artery, so another endovascular procedure was performed and stent was placed  Pt was found to have R sided weakness that has slightly improved.   MRI  shows shows L hemispheric MCA infarcts likely post procedure. Pts strength is improving specifically hand grip. Pt/ot No further imaging Transfer out of ICU Jasmine Arias  08/12/2015, 6:11 PM

## 2015-08-12 NOTE — Progress Notes (Signed)
Pt remains weaker on Rt arm than Left, speech is slurred at times, and pt has been lethargic, but easily rousable. Aggrastat drip discontinued, A line discontinued , PAD removed ( small old drainage, no acute bleeding noted ) . Patient is now in MRI , then will transfer to floor. O2 in place at 2 L, IV infusing well and Magnesium being replaced at this time. Family updated and all questions answered as asked.

## 2015-08-12 NOTE — Progress Notes (Signed)
Pt alert,confused to time and situation.  Pt follows commands, pt now has some weak movement of right arm and hand.  Pupils perla.  NSR with pvc's.  Pt on 2L [place].  Pt had episode of hypotension and was on dopamine drip for about 3 hrs.  Dopamine was turned off, then pt had hypertensive episode (sbp 160's) and received iv labetolol.  Since then, sbp 130-140's.  Right femoral site if free of bleeding, PAD remains in place.  Adequate urine output from foley.  Afebrile. Remains on aggrastat drip at 9.5 ml/hr.

## 2015-08-12 NOTE — Progress Notes (Signed)
    Subjective  - POD #1, s/p R CEA complicated by post op CVA, requiring stenting to a CCA dissection  Wants to eat   Physical Exam:  Does have 3/5 grip strength to her right hand Can lift right arm off of bed No significant deficits to bilateral LE       Assessment/Plan:  POD #1  Speech consult to assess swallowing PT/OT Mobilize Aggrastat to stop this am, and initiation of ASA and Plavix Consider DVT prophylaxis after Aggrastat d/c  [place], [place] 08/12/2015 9:57 AM --  Danley Danker Vitals:   08/12/15 0800  BP: 126/48  Pulse: 62  Temp:   Resp: 18    Intake/Output Summary (Last 24 hours) at 08/12/15 0957 Last data filed at 08/12/15 0600  Gross per 24 hour  Intake 910.92 ml  Output   2075 ml  Net -1164.08 ml     Laboratory CBC    Component Value Date/Time   WBC 13.5* 08/12/2015 0534   WBC 6.9 05/11/2015 1355   WBC 6.1 10/02/2014 1332   HGB 8.8* 08/12/2015 0534   HGB 13.7 10/02/2014 1332   HCT 26.6* 08/12/2015 0534   HCT 43.6 05/11/2015 1355   HCT 42.0 10/02/2014 1332   PLT 202 08/12/2015 0534   PLT 249 10/02/2014 1332    BMET    Component Value Date/Time   NA 140 08/12/2015 0534   NA 143 05/11/2015 1355   NA 139 10/02/2014 1332   K 3.6 08/12/2015 0534   K 3.5 10/02/2014 1332   CL 109 08/12/2015 0534   CL 104 10/02/2014 1332   CO2 24 08/12/2015 0534   CO2 28 10/02/2014 1332   GLUCOSE 188* 08/12/2015 0534   GLUCOSE 57* 05/11/2015 1355   GLUCOSE 133* 10/02/2014 1332   BUN 14 08/12/2015 0534   BUN 14 05/11/2015 1355   BUN 16 10/02/2014 1332   CREATININE 0.82 08/12/2015 0534   CREATININE 1.09 10/02/2014 1332   CALCIUM 7.5* 08/12/2015 0534   CALCIUM 9.1 10/02/2014 1332   GFRNONAA >60 08/12/2015 0534   GFRNONAA 52* 10/02/2014 1332   GFRAA >60 08/12/2015 0534   GFRAA >60 10/02/2014 1332    COAG Lab Results  Component Value Date   INR 1.22 08/11/2015   INR 1.03 08/04/2015   No results found for: PTT  Antibiotics Anti-infectives    Start     Dose/Rate Route Frequency Ordered Stop   08/11/15 1445  cefUROXime (ZINACEF) 1.5 g in dextrose 5 % 50 mL IVPB     1.5 g 100 mL/hr over 30 Minutes Intravenous Every 12 hours 08/11/15 1433 08/12/15 0230   08/11/15 1445  vancomycin (VANCOCIN) IVPB 1000 mg/200 mL premix     1,000 mg 200 mL/hr over 60 Minutes Intravenous Every 12 hours 08/11/15 1433 08/12/15 0429   08/11/15 0549  ceFAZolin (ANCEF) 2-3 GM-% IVPB SOLR    Comments:  Ronnell Freshwater: cabinet override      08/11/15 0549 08/11/15 1759   08/11/15 0346  ceFAZolin (ANCEF) IVPB 2 g/50 mL premix  Status:  Discontinued     2 g 100 mL/hr over 30 Minutes Intravenous On call to O.R. 08/11/15 0346 08/11/15 1125       V. Leia Alf, M.D. [facility] Office: [phone] Pager:  [phone]

## 2015-08-12 NOTE — Progress Notes (Signed)
Clarified order with Md re Aggrastat Drip, to stop at 3 pm and then give ASA and Plavix 3-4 hours later. Will remove A line and PAD and foley once Aggrastat is stopped then transfer to floor. Awaiting speech eval at this time

## 2015-08-12 NOTE — Progress Notes (Signed)
[facility] NAME: Jasmine Arias    MR#:  [id number]  DATE OF BIRTH:  27-May-1935  SUBJECTIVE:  CHIEF COMPLAINT:  I have right arm weakness Patient is 80 year old female with past medical history significant for history of hypertension, hyperlipidemia, diabetes, dementia, COPD, ongoing tobacco abuse, also history of bilateral carotid artery stenosis who presents to the hospital due to syncope and was found to have left carotid stenosis of 90%. Patient underwent left carotid stenting after which patient developed right upper extremity weakness, so CT angiogram revealed dissection of proximal left common carotid artery, so another endovascular procedure was performed and stent was placed. She feels satisfactory today. Admits of having right upper extremity weakness. However, it is improving. Right lower extremity is okay. Intermittent arrhythmias on telemetry with nonsustained V. tach as well as PVCs.   Review of Systems  Neurological: Positive for focal weakness.    VITAL SIGNS: Blood pressure 126/48, pulse 62, temperature 98.5 F (36.9 C), temperature source Oral, resp. rate 18, height 5' 1.5" (1.562 m), weight 57.9 kg (127 lb 10.3 oz), SpO2 100 %.  PHYSICAL EXAMINATION:   GENERAL:  80 y.o.-year-old patient lying in the bed with no acute distress. Some slurring of speech EYES: Pupils equal, round, reactive to light and accommodation. No scleral icterus. Extraocular muscles intact.  HEENT: Head atraumatic, normocephalic. Oropharynx and nasopharynx clear.  NECK:  Supple, no jugular venous distention. No thyroid enlargement, no tenderness.  LUNGS: Normal breath sounds bilaterally, no wheezing, rales,rhonchi or crepitation. No use of accessory muscles of respiration.  CARDIOVASCULAR: S1, S2 normal. No murmurs, rubs, or gallops.  ABDOMEN: Soft, nontender, nondistended. Bowel sounds present. No organomegaly or mass.  EXTREMITIES: No  pedal edema, cyanosis, or clubbing.  NEUROLOGIC: Cranial nerves II through XII are intact. Muscle strength 5/5 in all extremities. Sensation intact. Gait not checked. Right upper extremity weakness of 3 out of 5. Able to move her forearm and fingers, some able to squeeze.  PSYCHIATRIC: The patient is alert and oriented x 3.  SKIN: No obvious rash, lesion, or ulcer.   ORDERS/RESULTS REVIEWED:   CBC  Recent Labs Lab 08/11/15 1941 08/12/15 0028 08/12/15 0534  WBC 13.3*  --  13.5*  HGB 9.7* 9.1* 8.8*  HCT 29.3*  --  26.6*  PLT 219  --  202  MCV 88.8  --  89.9  MCH 29.3  --  29.7  MCHC 32.9  --  33.0  RDW 14.0  --  13.9   ------------------------------------------------------------------------------------------------------------------  Chemistries   Recent Labs Lab 08/11/15 1941 08/12/15 0534 08/12/15 1121  NA 139 140  --   K 3.7 3.6  --   CL 109 109  --   CO2 25 24  --   GLUCOSE 175* 188*  --   BUN 14 14  --   CREATININE 0.82 0.82  --   CALCIUM 7.6* 7.5*  --   MG  --   --  1.2*  AST 24  --   --   ALT 19  --   --   ALKPHOS 43  --   --   BILITOT <0.1*  --   --    ------------------------------------------------------------------------------------------------------------------ estimated creatinine clearance is 44.4 mL/min (by C-G formula based on Cr of 0.82). ------------------------------------------------------------------------------------------------------------------ No results for input(s): TSH, T4TOTAL, T3FREE, THYROIDAB in the last 72 hours.  Invalid input(s): FREET3  Cardiac Enzymes  Recent Labs Lab 08/11/15 2255 08/12/15 0534 08/12/15 1121  TROPONINI <  0.03 <0.03 <0.03   ------------------------------------------------------------------------------------------------------------------ Invalid input(s): POCBNP ---------------------------------------------------------------------------------------------------------------  RADIOLOGY: Ct Head Wo  Contrast  08/11/2015  CLINICAL DATA:  Initial evaluation for expressive aphasia, history of recent carotid endarterectomy and stent placement EXAM: CT HEAD WITHOUT CONTRAST TECHNIQUE: Contiguous axial images were obtained from the base of the skull through the vertex without intravenous contrast. COMPARISON:  Prior CT from 08/02/2015. FINDINGS: Generalized cerebral atrophy with chronic microvascular ischemic changes again seen, stable. No acute large vessel territory infarct. No intracranial hemorrhage. No mass lesion, midline shift, or mass effect. No extra-axial fluid collection. Scattered vascular calcifications within the carotid siphons. Scalp soft tissues demonstrate no acute abnormality. No acute abnormality about the orbits. Calvarium intact. Left frontal osteoma again noted. Small mild layering opacity within the left sphenoid sinus. Paranasal sinuses are otherwise clear. No mastoid effusion. IMPRESSION: 1. No acute intracranial process. 2. Moderate generalized atrophy with chronic small vessel ischemic disease. Electronically Signed   By: Jeannine Boga M.D.   On: 08/11/2015 22:30   Ct Angio Neck W/cm &/or Wo/cm  08/11/2015  CLINICAL DATA:  Left carotid endarterectomy today. Right arm weakness EXAM: CT ANGIOGRAPHY NECK TECHNIQUE: Multidetector CT imaging of the neck was performed using the standard protocol during bolus administration of intravenous contrast. Multiplanar CT image reconstructions and MIPs were obtained to evaluate the vascular anatomy. Carotid stenosis measurements (when applicable) are obtained utilizing NASCET criteria, using the distal internal carotid diameter as the denominator. CONTRAST:  32mL OMNIPAQUE IOHEXOL 350 MG/ML SOLN COMPARISON:  CTA neck 12/20/2014.  MR head 08/02/2015 FINDINGS: Aortic arch: Extensive atherosclerotic calcification throughout the aortic arch without aneurysm or dissection. Calcified plaque in the innominate artery, right subclavian artery, and  proximal right common carotid artery. Atherosclerotic calcified plaque at the origin of the left common carotid artery and left subclavian. Atherosclerotic disease throughout the left subclavian artery. Right carotid system: Calcified plaque in the mid right common carotid artery. Heavily calcified plaque at the right carotid bifurcation. Estimated 70% diameter stenosis of the right internal carotid artery. Mild to moderate stenosis origin of the right external carotid artery. Left carotid system: Postop left carotid endarterectomy with gas in the soft tissues of the left neck. No hematoma. Multiple small filling defects are present in the left common carotid, left internal carotid, and left external carotid arteries consistent with thrombus. There is a linear filling defect in the mid left common carotid artery which is likely a dissection. There is thrombus just above this dissection. These are nonocclusive. Left external carotid artery patent with atherosclerotic calcified plaque 1 cm distal to the origin. Vertebral arteries:Both vertebral arteries patent to the basilar. Atherosclerotic calcification at the origin of the right vertebral artery. Both vertebral arteries are patent to the basilar. Mild stenosis distal left vertebral artery at the C1 level. Calcified plaque with moderate stenosis mid right vertebral artery. Skeleton: Moderate spondylosis and spinal stenosis C5-6 and C6-7. No acute skeletal abnormality. Other neck: Enhancing mass left parotid gland extending close to the deep lobe is unchanged from prior studies. Mass measures 18 x 22 mm. 5 mm enhancing nodule in the right parotid tail also unchanged. Lung apices clear. IMPRESSION: Postop left carotid endarterectomy today with gas in the soft tissues. Dissection of the mid left common carotid artery with scattered areas of thrombus adherent to the carotid wall involving the left common carotid, left internal carotid, and left external carotid  arteries. These are nonocclusive and are a source of emboli causing stroke. CTA head not performed day. Atherosclerotic stenosis  of the right carotid bifurcation with 70% diameter stenosis right internal carotid artery. 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 5 mm enhancing nodule right parotid tail also unchanged. Critical Value/emergent results were called by telephone at the time of interpretation on 08/11/2015 at 12:50 Pm to Dr. Hortencia Pilar , who verbally acknowledged these results. Electronically Signed   By: Franchot Gallo M.D.   On: 08/11/2015 13:10    EKG:  Orders placed or performed during the hospital encounter of 08/11/15  . EKG 12-Lead  . EKG 12-Lead    ASSESSMENT AND PLAN:  Active Problems:   Carotid stenosis, symptomatic, with infarction ([place]) 1. Acute stroke with right sided weakness and aphasia, aspirin, Plavix, discontinue Aggrastat  per vascular surgery. Physical therapy, occupational therapy consulted as well as speech therapist 2. Left carotid stenosis status post left carotid stent placement complicated by left common carotid artery dissection and repeated procedure with stent placement, continue aspirin as well as Plavix. Per vascular surgery 3. Nonsustained V. Tach, and using level was checked, was found to be low at 1.2. Will replenish magnesium. We'll continue metoprolol intravenously make his scheduled 4. Hypomagnesemia supplementing intravenously   Management plans discussed with the patient, family and they are in agreement.   DRUG ALLERGIES: No Known Allergies  CODE STATUS:     Code Status Orders        Start     Ordered   08/11/15 1434  Full code   Continuous     08/11/15 1433      TOTAL TIME TAKING CARE OF THIS PATIENT: 50 minutes.    Theodoro Grist M.D on 08/12/2015 at 2:03 PM  Between 7am to 6pm - Pager - [phone]  After 6pm go to  [email or web address] - password [email or web address] [facility]  Office  [phone]  CC: Primary care physician; Park Liter, DO

## 2015-08-12 NOTE — Evaluation (Addendum)
Clinical/Bedside Swallow Evaluation Patient Details  Name: Jasmine Arias MRN: [id number] Date of Birth: 05/02/1935  Today's Date: 08/12/2015 Time:        Past Medical History:  Past Medical History  Diagnosis Date  . Hyperlipidemia   . Hypertension   . Diabetes mellitus without complication ([place])   . Memory changes   . COPD (chronic obstructive pulmonary disease) ([place])   . Anxiety   . Indigestion    Past Surgical History:  Past Surgical History  Procedure Laterality Date  . Abdominal hysterectomy      heavy bleeding  . Endarterectomy Left 08/11/2015    Procedure: ENDARTERECTOMY CAROTID;  Surgeon: Katha Cabal, MD;  Location: [facility] ORS;  Service: Vascular;  Laterality: Left;   HPI:  Jasmine Arias is a 80 y.o. female with a known history of hip retention, hyperlipidemia, diabetes, dementia, COPD, ongoing smoking, known bilateral carotid artery stenosis presented to the hospital secondary to a syncopal episode last week. She was seen by vascular and got admitted for an elective left carotid endarterectomy for 90% critical stenosis. The stent procedure patient had right arm weakness while in the recovery room and a stat CT angiogram showed a dissection and proximal left common carotid artery and so had another procedure endovascular stent was placed in the common carotid artery.   Assessment / Plan / Recommendation Clinical Impression  Pt presents with inconsistent s/s of aspiration with all consistencies except ice chips. Oral motor strength and ROM appeared adeqaute. Pt with weak, breathy vocal quality prior to any PO's as well as a weak cough. Given bites of applesauce and sips of liquid, Pt presents with an occasional weak cough. She also presents with mutiple swallows per bolus and complains of pain with swallowing. Swallow was noted to be effortful which may indicate pharyngeal residue. Pt complained of hunger and wanted to finish the applesauce despite ssome throat pain.  Given the above symptoms, would recommend holding off on ordering a diet until further improvement is noted. Nsg reports Pt's has shown improvement with speech and arm movement since yesterday. Rec allowing meds by mouth. Will need to check crushability and give in applesauce or pudding. Pt may have several more bites of the applesauce or pudding given by Nsg as long as she is tolerating it, to help satisfy her hunger. Encourage an occasional strong cough. Allow ice chips for pleasure. SLP to reassess Monday am in hopes of starting a diet. If symptoms continue on Monday, will rec a Mod Ba swallow at that time. Speech was weak but appropriate and intelligible at the sentence level during the swallow eval. Further assessment may be indicated. Pt was able to follow directions without difficulty.     Aspiration Risk  Moderate aspiration risk    Diet Recommendation   NPO except meds crushed in applesauce or pudding. May have a few more bites as long as she is tolerating it, as well as ice chips for pleasure in moderation.       Other  Recommendations Oral Care Recommendations: Oral care prior to ice chip/H20   Follow up Recommendations   Reassess Monday in hopes of improvement for a diet    Frequency and Duration min 5x/week  2 weeks       Swallow Study   General Date of Onset: 08/11/15 HPI: Jasmine Arias is a 80 y.o. female with a known history of hip retention, hyperlipidemia, diabetes, dementia, COPD, ongoing smoking, known bilateral carotid artery stenosis presented to the hospital secondary  to a syncopal episode last week. She was seen by vascular and got admitted for an elective left carotid endarterectomy for 90% critical stenosis. The stent procedure patient had right arm weakness while in the recovery room and a stat CT angiogram showed a dissection and proximal left common carotid artery and so had another procedure endovascular stent was placed in the common carotid artery. Diet Prior to  this Study: Regular Behavior/Cognition: Alert Oral Cavity - Dentition: Adequate natural dentition Vision: Functional for self-feeding Patient Positioning: Upright in bed Baseline Vocal Quality: Breathy;Low vocal intensity Volitional Cough: Weak Volitional Swallow: Able to elicit    Oral/Motor/Sensory Function Overall Oral Motor/Sensory Function: Within functional limits   Ice Chips Ice chips: Within functional limits   Thin Liquid Presentation: Cup Other Comments: Weak cough inconsistently    Nectar Thick     Honey Thick Other Comments: weak conugh inconsistently   Puree Puree: Impaired Presentation: Spoon Pharyngeal Phase Impairments: Decreased hyoid-laryngeal movement;Multiple swallows;Cough - Delayed Other Comments: complained of pain with swallowing   Solid         Lucila Maine 08/12/2015,1:27 PM

## 2015-08-13 DIAGNOSIS — F17210 Nicotine dependence, cigarettes, uncomplicated: Secondary | ICD-10-CM | POA: Diagnosis not present

## 2015-08-13 DIAGNOSIS — Z79899 Other long term (current) drug therapy: Secondary | ICD-10-CM | POA: Diagnosis not present

## 2015-08-13 DIAGNOSIS — I472 Ventricular tachycardia: Secondary | ICD-10-CM | POA: Diagnosis not present

## 2015-08-13 DIAGNOSIS — Z8249 Family history of ischemic heart disease and other diseases of the circulatory system: Secondary | ICD-10-CM | POA: Diagnosis not present

## 2015-08-13 DIAGNOSIS — G8191 Hemiplegia, unspecified affecting right dominant side: Secondary | ICD-10-CM | POA: Diagnosis not present

## 2015-08-13 DIAGNOSIS — R4701 Aphasia: Secondary | ICD-10-CM | POA: Diagnosis not present

## 2015-08-13 DIAGNOSIS — E785 Hyperlipidemia, unspecified: Secondary | ICD-10-CM | POA: Diagnosis not present

## 2015-08-13 DIAGNOSIS — I7771 Dissection of carotid artery: Secondary | ICD-10-CM | POA: Diagnosis not present

## 2015-08-13 DIAGNOSIS — I6523 Occlusion and stenosis of bilateral carotid arteries: Secondary | ICD-10-CM | POA: Diagnosis not present

## 2015-08-13 DIAGNOSIS — E119 Type 2 diabetes mellitus without complications: Secondary | ICD-10-CM | POA: Diagnosis not present

## 2015-08-13 DIAGNOSIS — I10 Essential (primary) hypertension: Secondary | ICD-10-CM | POA: Diagnosis not present

## 2015-08-13 DIAGNOSIS — J449 Chronic obstructive pulmonary disease, unspecified: Secondary | ICD-10-CM | POA: Diagnosis not present

## 2015-08-13 DIAGNOSIS — I97821 Postprocedural cerebrovascular infarction during other surgery: Secondary | ICD-10-CM | POA: Diagnosis not present

## 2015-08-13 DIAGNOSIS — F0150 Vascular dementia without behavioral disturbance: Secondary | ICD-10-CM | POA: Diagnosis not present

## 2015-08-13 DIAGNOSIS — I63412 Cerebral infarction due to embolism of left middle cerebral artery: Secondary | ICD-10-CM | POA: Diagnosis not present

## 2015-08-13 DIAGNOSIS — Z7982 Long term (current) use of aspirin: Secondary | ICD-10-CM | POA: Diagnosis not present

## 2015-08-13 DIAGNOSIS — I493 Ventricular premature depolarization: Secondary | ICD-10-CM | POA: Diagnosis not present

## 2015-08-13 LAB — CBC
HCT: 22.2 % — ABNORMAL LOW (ref 35.0–47.0)
Hemoglobin: 7.3 g/dL — ABNORMAL LOW (ref 12.0–16.0)
MCH: 29 pg (ref 26.0–34.0)
MCHC: 32.7 g/dL (ref 32.0–36.0)
MCV: 88.5 fL (ref 80.0–100.0)
PLATELETS: 166 10*3/uL (ref 150–440)
RBC: 2.51 MIL/uL — ABNORMAL LOW (ref 3.80–5.20)
RDW: 14.2 % (ref 11.5–14.5)
WBC: 7.5 10*3/uL (ref 3.6–11.0)

## 2015-08-13 LAB — BASIC METABOLIC PANEL
Anion gap: 3 — ABNORMAL LOW (ref 5–15)
BUN: 11 mg/dL (ref 6–20)
CHLORIDE: 111 mmol/L (ref 101–111)
CO2: 28 mmol/L (ref 22–32)
CREATININE: 0.74 mg/dL (ref 0.44–1.00)
Calcium: 7.4 mg/dL — ABNORMAL LOW (ref 8.9–10.3)
GFR calc Af Amer: >60 mL/min (ref >60)
GLUCOSE: 148 mg/dL — AB (ref 65–99)
Potassium: 3.2 mmol/L — ABNORMAL LOW (ref 3.5–5.1)
SODIUM: 142 mmol/L (ref 135–145)

## 2015-08-13 LAB — MAGNESIUM: MAGNESIUM: 2.6 mg/dL — AB (ref 1.7–2.4)

## 2015-08-13 MED ORDER — POTASSIUM CHLORIDE 10 MEQ/100ML IV SOLN
10.0000 meq | INTRAVENOUS | Status: AC
  Administered 2015-08-13 (×4): 10 meq via INTRAVENOUS
  Filled 2015-08-13 (×4): qty 100

## 2015-08-13 NOTE — Evaluation (Signed)
Physical Therapy Evaluation Patient Details Name: Jasmine Arias MRN: [id number] DOB: Jan 31, 1935 Today's Date: 08/13/2015   History of Present Illness  Pt had a stent placed, then had R UE weakness/flaccidity.  Clinical Impression  Pt is eager to work with PT and motivated to ambulate, etc despite some dizziness.  Her R UE is essentially flaccid and will be limited with self care and ability to do ADLs.  She wants to go home, but agrees with her husband that many things will be tough given her current weakness.  She is able to ambulate and shows good balance but ultimately is not near her baseline.  Pt may need a sling for R UE.    Follow Up Recommendations SNF;Home health PT (per pt progress)    Equipment Recommendations  Other (comment) (may need sling?)    Recommendations for Other Services       Precautions / Restrictions Precautions Precautions: None Restrictions Weight Bearing Restrictions: No      Mobility  Bed Mobility Overal bed mobility: Needs Assistance Bed Mobility: Supine to Sit;Sit to Supine     Supine to sit: Min assist Sit to supine: Min assist   General bed mobility comments: Pt shows good effort getting in/out of bed, needed rails and HHA  Transfers Overall transfer level: Modified independent               General transfer comment: Pt was able to rise to standing w/o direct assist, though she did c/o some dizziness and weakness on standing  Ambulation/Gait Ambulation/Gait assistance: Min guard Ambulation Distance (Feet): 50 Feet Assistive device: None       General Gait Details: Pt actaully does well with ambulation, though she is guarded and hesitant the entire time.  Her R arm is flaccid and she is unable to control it during ambulation.  She has no LOBs but fatigues and c/o dizziness.  Stairs            Wheelchair Mobility    Modified Rankin (Stroke Patients Only)       Balance Overall balance assessment: Modified  Independent                                           Pertinent Vitals/Pain Pain Assessment: No/denies pain    Home Living Family/patient expects to be discharged to:: Private residence Living Arrangements: Spouse/significant other (Husband is concerned this may be difficult) Available Help at Discharge: Family (husband has had a CVA and does not feel like he can help her)   Home Access: Stairs to enter   Technical brewer of Steps: 1   Home Equipment: Environmental consultant - 2 wheels;Cane - single point      Prior Function Level of Independence: Independent               Hand Dominance        Extremity/Trunk Assessment   Upper Extremity Assessment: RUE deficits/detail RUE Deficits / Details: Pt with essentially no R UE strength, shows some trace grip and elbow flx/ext   RUE Sensation:  (pt reports equal sensation b/l with testing)     Lower Extremity Assessment: Overall WFL for tasks assessed (minimal R sided coordiantion/confidence limitations)         Communication   Communication: No difficulties  Cognition Arousal/Alertness: Awake/alert Behavior During Therapy: WFL for tasks assessed/performed Overall Cognitive Status: Within Functional Limits for tasks  assessed                      General Comments      Exercises        Assessment/Plan    PT Assessment    PT Diagnosis Difficulty walking;Generalized weakness;Hemiplegia dominant side   PT Problem List    PT Treatment Interventions     PT Goals (Current goals can be found in the Care Plan section) Acute Rehab PT Goals Patient Stated Goal: "I want to get this (R) arm strong again." PT Goal Formulation: With patient/family Time For Goal Achievement: 08/27/15 Potential to Achieve Goals: Fair    Frequency     Barriers to discharge        Co-evaluation               End of Session Equipment Utilized During Treatment: Gait belt Activity Tolerance: Patient limited  by fatigue Patient left: in bed;with nursing/sitter in room Nurse Communication: Mobility status         Time: [id number] PT Time Calculation (min) (ACUTE ONLY): 27 min   Charges:   PT Evaluation $Initial PT Evaluation Tier I: 1 Procedure     PT G Codes:       Wayne Both, PT, DPT [phone]   Kreg Shropshire 08/13/2015, 3:14 PM

## 2015-08-13 NOTE — Progress Notes (Signed)
    Subjective  - POD #2  Feels better this morning   Physical Exam:  Right arm strength is slightly improved today.  Grip strength slightly improved.       Assessment/Plan:  POD #2  Appreciate neurology consult.  MRI shows left hemispheric infarcts.  Continue aspirin and Plavix Evaluated by speech therapy yesterday.  We'll reassess tomorrow, can have small bites of applesauce We'll need physical therapy and occupational therapy consultation which is been ordered.  Jasmine Arias, Jasmine Arias 08/13/2015 1:40 PM --  Filed Vitals:   08/13/15 1200  BP: 133/59  Pulse: 103  Temp:   Resp: 27    Intake/Output Summary (Last 24 hours) at 08/13/15 1340 Last data filed at 08/13/15 1200  Gross per 24 hour  Intake   3490 ml  Output   1400 ml  Net   2090 ml     Laboratory CBC    Component Value Date/Time   WBC 7.5 08/13/2015 0521   WBC 6.9 05/11/2015 1355   WBC 6.1 10/02/2014 1332   HGB 7.3* 08/13/2015 0521   HGB 13.7 10/02/2014 1332   HCT 22.2* 08/13/2015 0521   HCT 43.6 05/11/2015 1355   HCT 42.0 10/02/2014 1332   PLT 166 08/13/2015 0521   PLT 249 10/02/2014 1332    BMET    Component Value Date/Time   NA 142 08/13/2015 0521   NA 143 05/11/2015 1355   NA 139 10/02/2014 1332   K 3.2* 08/13/2015 0521   K 3.5 10/02/2014 1332   CL 111 08/13/2015 0521   CL 104 10/02/2014 1332   CO2 28 08/13/2015 0521   CO2 28 10/02/2014 1332   GLUCOSE 148* 08/13/2015 0521   GLUCOSE 57* 05/11/2015 1355   GLUCOSE 133* 10/02/2014 1332   BUN 11 08/13/2015 0521   BUN 14 05/11/2015 1355   BUN 16 10/02/2014 1332   CREATININE 0.74 08/13/2015 0521   CREATININE 1.09 10/02/2014 1332   CALCIUM 7.4* 08/13/2015 0521   CALCIUM 9.1 10/02/2014 1332   GFRNONAA >60 08/13/2015 0521   GFRNONAA 52* 10/02/2014 1332   GFRAA >60 08/13/2015 0521   GFRAA >60 10/02/2014 1332    COAG Lab Results  Component Value Date   INR 1.22 08/11/2015   INR 1.03 08/04/2015   No results found for:  PTT  Antibiotics Anti-infectives    Start     Dose/Rate Route Frequency Ordered Stop   08/11/15 1445  cefUROXime (ZINACEF) 1.5 g in dextrose 5 % 50 mL IVPB     1.5 g 100 mL/hr over 30 Minutes Intravenous Every 12 hours 08/11/15 1433 08/12/15 0230   08/11/15 1445  vancomycin (VANCOCIN) IVPB 1000 mg/200 mL premix     1,000 mg 200 mL/hr over 60 Minutes Intravenous Every 12 hours 08/11/15 1433 08/12/15 0429   08/11/15 0549  ceFAZolin (ANCEF) 2-3 GM-% IVPB SOLR    Comments:  Ronnell Freshwater: cabinet override      08/11/15 0549 08/11/15 1759   08/11/15 0346  ceFAZolin (ANCEF) IVPB 2 g/50 mL premix  Status:  Discontinued     2 g 100 mL/hr over 30 Minutes Intravenous On call to O.R. 08/11/15 0346 08/11/15 1125       V. Leia Alf, M.D. [facility] Office: [phone] Pager:  [phone]

## 2015-08-13 NOTE — Progress Notes (Signed)
[facility] [place] NAME: Jasmine Arias    MR#:  [id number]  DATE OF BIRTH:  [birth year]  SUBJECTIVE:  CHIEF COMPLAINT:  I have right arm weakness Patient is 80 year old female with past medical history significant for history of hypertension, hyperlipidemia, diabetes, dementia, COPD, ongoing tobacco abuse, also history of bilateral carotid artery stenosis who presents to the hospital due to syncope and was found to have left carotid stenosis of 90%. Patient underwent left carotid stenting after which patient developed right upper extremity weakness, so CT angiogram revealed dissection of proximal left common carotid artery, so another endovascular procedure was performed and stent was placed. She feels satisfactory today. Admits of having right upper extremity weakness. However, it is improving. Right lower extremity is okay. Intermittent arrhythmias on telemetry with nonsustained V. tach as well as PVCs.  Further improving right upper extremity strength, no problems with his right lower extremity. The patient. She feels encouraged. Wants to eat. MRI of brain revealed multifocal areas of acute nonhemorrhagic left hemisphere MCA territory infarcts  Review of Systems  Constitutional: Negative for fever, chills and weight loss.  HENT: Negative for congestion.   Eyes: Negative for blurred vision and double vision.  Respiratory: Negative for cough, sputum production, shortness of breath and wheezing.   Cardiovascular: Negative for chest pain, palpitations, orthopnea, leg swelling and PND.  Gastrointestinal: Negative for nausea, vomiting, abdominal pain, diarrhea, constipation and blood in stool.  Genitourinary: Negative for dysuria, urgency, frequency and hematuria.  Musculoskeletal: Negative for falls.  Neurological: Positive for focal weakness. Negative for dizziness, tremors and headaches.  Endo/Heme/Allergies: Does not bruise/bleed easily.   Psychiatric/Behavioral: Negative for depression. The patient does not have insomnia.     VITAL SIGNS: Blood pressure 133/59, pulse 103, temperature 98.4 F (36.9 C), temperature source Oral, resp. rate 27, height 5' 1.5" (1.562 m), weight 57.9 kg (127 lb 10.3 oz), SpO2 98 %.  PHYSICAL EXAMINATION:   GENERAL:  80 y.o.-year-old patient lying in the bed with no acute distress. Some slurring of speech EYES: Pupils equal, round, reactive to light and accommodation. No scleral icterus. Extraocular muscles intact.  HEENT: Head atraumatic, normocephalic. Oropharynx and nasopharynx clear.  NECK:  Supple, no jugular venous distention. No thyroid enlargement, no tenderness.  LUNGS: Normal breath sounds bilaterally, no wheezing, rales,rhonchi or crepitation. No use of accessory muscles of respiration.  CARDIOVASCULAR: S1, S2 normal. No murmurs, rubs, or gallops.  ABDOMEN: Soft, nontender, nondistended. Bowel sounds present. No organomegaly or mass.  EXTREMITIES: No pedal edema, cyanosis, or clubbing.  NEUROLOGIC: Cranial nerves II through XII are intact. Muscle strength 5/5 in all extremities. Sensation intact. Gait not checked. Right upper extremity weakness of 3 out of 5. Able to move her forearm and fingers, able to squeeze, improving strength of all.  PSYCHIATRIC: The patient is alert and oriented x 3.  SKIN: No obvious rash, lesion, or ulcer.   ORDERS/RESULTS REVIEWED:   CBC  Recent Labs Lab 08/11/15 1941 08/12/15 0028 08/12/15 0534 08/13/15 0521  WBC 13.3*  --  13.5* 7.5  HGB 9.7* 9.1* 8.8* 7.3*  HCT 29.3*  --  26.6* 22.2*  PLT 219  --  202 166  MCV 88.8  --  89.9 88.5  MCH 29.3  --  29.7 29.0  MCHC 32.9  --  33.0 32.7  RDW 14.0  --  13.9 14.2   ------------------------------------------------------------------------------------------------------------------  Chemistries   Recent Labs Lab 08/11/15 1941 08/12/15 0534 08/12/15 1121 08/13/15 0521  NA 139 140  --  142  K 3.7  3.6  --  3.2*  CL 109 109  --  111  CO2 25 24  --  28  GLUCOSE 175* 188*  --  148*  BUN 14 14  --  11  CREATININE 0.82 0.82  --  0.74  CALCIUM 7.6* 7.5*  --  7.4*  MG  --   --  1.2* 2.6*  AST 24  --   --   --   ALT 19  --   --   --   ALKPHOS 43  --   --   --   BILITOT <0.1*  --   --   --    ------------------------------------------------------------------------------------------------------------------ estimated creatinine clearance is 45.6 mL/min (by C-G formula based on Cr of 0.74). ------------------------------------------------------------------------------------------------------------------ No results for input(s): TSH, T4TOTAL, T3FREE, THYROIDAB in the last 72 hours.  Invalid input(s): FREET3  Cardiac Enzymes  Recent Labs Lab 08/11/15 2255 08/12/15 0534 08/12/15 1121  TROPONINI <0.03 <0.03 <0.03   ------------------------------------------------------------------------------------------------------------------ Invalid input(s): POCBNP ---------------------------------------------------------------------------------------------------------------  RADIOLOGY: Ct Head Wo Contrast  08/11/2015  CLINICAL DATA:  Initial evaluation for expressive aphasia, history of recent carotid endarterectomy and stent placement EXAM: CT HEAD WITHOUT CONTRAST TECHNIQUE: Contiguous axial images were obtained from the base of the skull through the vertex without intravenous contrast. COMPARISON:  Prior CT from 08/02/2015. FINDINGS: Generalized cerebral atrophy with chronic microvascular ischemic changes again seen, stable. No acute large vessel territory infarct. No intracranial hemorrhage. No mass lesion, midline shift, or mass effect. No extra-axial fluid collection. Scattered vascular calcifications within the carotid siphons. Scalp soft tissues demonstrate no acute abnormality. No acute abnormality about the orbits. Calvarium intact. Left frontal osteoma again noted. Small mild layering  opacity within the left sphenoid sinus. Paranasal sinuses are otherwise clear. No mastoid effusion. IMPRESSION: 1. No acute intracranial process. 2. Moderate generalized atrophy with chronic small vessel ischemic disease. Electronically Signed   By: Jeannine Boga M.D.   On: 08/11/2015 22:30   Mr Brain Wo Contrast  08/12/2015  CLINICAL DATA:  Recent LEFT carotid endarterectomy, requiring postoperative stent placement due to dissection with thrombus. Difficulty speaking, decreased level of consciousness. RIGHT hemiparesis. Initial encounter. EXAM: MRI HEAD WITHOUT CONTRAST TECHNIQUE: Multiplanar, multiecho pulse sequences of the brain and surrounding structures were obtained without intravenous contrast. COMPARISON:  CT angiography of the neck 08/11/2015. CT head 08/11/2015. Preoperative MRI brain 07/12/2015. FINDINGS: Multifocal areas of restricted diffusion, representing acute nonhemorrhagic infarction, affect the LEFT hemisphere. All lie within the LEFT MCA distribution. These include the LEFT posterior frontal cortex, LEFT parietal cortex, and adjacent subcortical white matter. Additional area of acute infarction affects the LEFT external capsule just lateral and inferior to the lentiform nucleus. Equivocal tiny areas of acute infarction affect the LEFT lateral occipital lobe and LEFT periventricular white matter. No abnormalities in the RIGHT hemisphere, brainstem, or cerebellum. Generalized atrophy. Moderate T2 and FLAIR hyperintensities throughout the periventricular and subcortical white matter representing chronic microvascular ischemic change. No acute hemorrhage is observed. No mass lesion, or extra-axial fluid. Flow voids are maintained in the internal carotid arteries, basilar artery, both vertebral arteries. Specifically the LEFT cervical ICA is patent. In addition, there is no evidence for proximal LEFT middle cerebral artery disease. Unremarkable pituitary and cerebellar tonsils. Upper  cervical region unremarkable. No sinus or mastoid disease. Soft tissue swelling in the LEFT neck. IMPRESSION: Multifocal areas of acute, nonhemorrhagic, LEFT hemisphere MCA territory infarction as described. Patency of the LEFT ICA and  MCA is established. Atrophy and small vessel disease. Electronically Signed   By: Staci Righter M.D.   On: 08/12/2015 17:08    EKG:  Orders placed or performed during the hospital encounter of 08/11/15  . EKG 12-Lead  . EKG 12-Lead    ASSESSMENT AND PLAN:  Active Problems:   Carotid stenosis, symptomatic, with infarction ([place]) 1. Acute embolic stroke in left MCA territory with multifocal areas of infarcts with right sided weakness and aphasia, continue aspirin, Plavix, continue Lipitor. Physical therapy, occupational therapy consulted as well as speech therapist, applesauce. For now 2. Left carotid stenosis status post left carotid stent placement complicated by left common carotid artery dissection and repeated procedure with stent placement, continue aspirin as well as Plavix. Per vascular surgery, stable 3. Nonsustained V. Tach, resolved. The magnesium level was checked, was found to be low at 1.2,  replenished  4. Hypomagnesemia , supplemented intravenously   Management plans discussed with the patient, family and they are in agreement.   DRUG ALLERGIES: No Known Allergies  CODE STATUS:     Code Status Orders        Start     Ordered   08/11/15 1434  Full code   Continuous     08/11/15 1433      TOTAL TIME TAKING CARE OF THIS PATIENT: 35 minutes.    Theodoro Grist M.D on 08/13/2015 at 1:46 PM  Between 7am to 6pm - Pager - [phone]  After 6pm go to [email or web address] - password [email or web address] [facility]  Office  [phone]  CC: Primary care physician; Park Liter, DO

## 2015-08-14 DIAGNOSIS — I63239 Cerebral infarction due to unspecified occlusion or stenosis of unspecified carotid arteries: Secondary | ICD-10-CM

## 2015-08-14 LAB — SURGICAL PATHOLOGY

## 2015-08-14 MED ORDER — AMLODIPINE BESYLATE 5 MG PO TABS
2.5000 mg | ORAL_TABLET | Freq: Every day | ORAL | Status: DC
  Administered 2015-08-14 – 2015-08-15 (×2): 2.5 mg via ORAL
  Filled 2015-08-14 (×2): qty 1

## 2015-08-14 MED ORDER — FAMOTIDINE 20 MG PO TABS
20.0000 mg | ORAL_TABLET | Freq: Two times a day (BID) | ORAL | Status: DC
  Administered 2015-08-14 – 2015-08-16 (×4): 20 mg via ORAL
  Filled 2015-08-14 (×4): qty 1

## 2015-08-14 NOTE — Care Management Important Message (Signed)
Important Message  Patient Details  Name: Jasmine Arias MRN: [id number] Date of Birth: [birth year]   Medicare Important Message Given:  Yes    Katrina Stack, RN 08/14/2015, 10:53 AM

## 2015-08-14 NOTE — Progress Notes (Signed)
    Subjective  - POD #3  Feels better this morning   Physical Exam:  Right arm strength is slightly improved today.  Grip strength slightly improved.       Assessment/Plan:  POD #3  Appreciate neurology consult.  MRI shows left hemispheric infarcts.  Continue aspirin and Plavix Evaluated by speech therapy diet to be advanced.   Seen by physical therapy and occupational therapy will need inpatient rehab  Katha Cabal 08/14/2015 6:41 PM --  Danley Danker Vitals:   08/14/15 1223  BP: 141/67  Pulse: 103  Temp: 98.7 F (37.1 C)  Resp: 20    Intake/Output Summary (Last 24 hours) at 08/14/15 1841 Last data filed at 08/14/15 0700  Gross per 24 hour  Intake    950 ml  Output   1050 ml  Net   -100 ml     Laboratory CBC    Component Value Date/Time   WBC 7.5 08/13/2015 0521   WBC 6.9 05/11/2015 1355   WBC 6.1 10/02/2014 1332   HGB 7.3* 08/13/2015 0521   HGB 13.7 10/02/2014 1332   HCT 22.2* 08/13/2015 0521   HCT 43.6 05/11/2015 1355   HCT 42.0 10/02/2014 1332   PLT 166 08/13/2015 0521   PLT 249 10/02/2014 1332    BMET    Component Value Date/Time   NA 142 08/13/2015 0521   NA 143 05/11/2015 1355   NA 139 10/02/2014 1332   K 3.2* 08/13/2015 0521   K 3.5 10/02/2014 1332   CL 111 08/13/2015 0521   CL 104 10/02/2014 1332   CO2 28 08/13/2015 0521   CO2 28 10/02/2014 1332   GLUCOSE 148* 08/13/2015 0521   GLUCOSE 57* 05/11/2015 1355   GLUCOSE 133* 10/02/2014 1332   BUN 11 08/13/2015 0521   BUN 14 05/11/2015 1355   BUN 16 10/02/2014 1332   CREATININE 0.74 08/13/2015 0521   CREATININE 1.09 10/02/2014 1332   CALCIUM 7.4* 08/13/2015 0521   CALCIUM 9.1 10/02/2014 1332   GFRNONAA >60 08/13/2015 0521   GFRNONAA 52* 10/02/2014 1332   GFRAA >60 08/13/2015 0521   GFRAA >60 10/02/2014 1332    COAG Lab Results  Component Value Date   INR 1.22 08/11/2015   INR 1.03 08/04/2015   No results found for: PTT  Antibiotics Anti-infectives    Start      Dose/Rate Route Frequency Ordered Stop   08/11/15 1445  cefUROXime (ZINACEF) 1.5 g in dextrose 5 % 50 mL IVPB     1.5 g 100 mL/hr over 30 Minutes Intravenous Every 12 hours 08/11/15 1433 08/12/15 0230   08/11/15 1445  vancomycin (VANCOCIN) IVPB 1000 mg/200 mL premix     1,000 mg 200 mL/hr over 60 Minutes Intravenous Every 12 hours 08/11/15 1433 08/12/15 0429   08/11/15 0549  ceFAZolin (ANCEF) 2-3 GM-% IVPB SOLR    Comments:  Ronnell Freshwater: cabinet override      08/11/15 0549 08/11/15 1759   08/11/15 0346  ceFAZolin (ANCEF) IVPB 2 g/50 mL premix  Status:  Discontinued     2 g 100 mL/hr over 30 Minutes Intravenous On call to O.R. 08/11/15 0346 08/11/15 1125

## 2015-08-14 NOTE — Consult Note (Signed)
Minimal improvement in RUE in the setting of embolic strokes.     Past Medical History  Diagnosis Date  . Hyperlipidemia   . Hypertension   . Diabetes mellitus without complication ([place])   . Memory changes   . COPD (chronic obstructive pulmonary disease) ([place])   . Anxiety   . Indigestion     Past Surgical History  Procedure Laterality Date  . Abdominal hysterectomy      heavy bleeding  . Endarterectomy Left 08/11/2015    Procedure: ENDARTERECTOMY CAROTID;  Surgeon: Katha Cabal, MD;  Location: [facility] ORS;  Service: Vascular;  Laterality: Left;    Family History  Problem Relation Age of Onset  . Hypertension Mother   . Hypertension Father   . Heart disease Sister   . Hypertension Brother     Social History:  reports that she has been smoking.  She has never used smokeless tobacco. She reports that she does not drink alcohol or use illicit drugs.  No Known Allergies  Medications: I have reviewed the patient's current medications.  ROS: History obtained from the patient  General ROS: negative for - chills, fatigue, fever, night sweats, weight gain or weight loss Psychological ROS: negative for - behavioral disorder, hallucinations, memory difficulties, mood swings or suicidal ideation Ophthalmic ROS: negative for - blurry vision, double vision, eye pain or loss of vision ENT ROS: negative for - epistaxis, nasal discharge, oral lesions, sore throat, tinnitus or vertigo Allergy and Immunology ROS: negative for - hives or itchy/watery eyes Hematological and Lymphatic ROS: negative for - bleeding problems, bruising or swollen lymph nodes Endocrine ROS: negative for - galactorrhea, hair pattern changes, polydipsia/polyuria or temperature intolerance Respiratory ROS: negative for - cough, hemoptysis, shortness of breath or wheezing Cardiovascular ROS: negative for - chest pain, dyspnea on exertion, edema or irregular heartbeat Gastrointestinal ROS: negative for - abdominal  pain, diarrhea, hematemesis, nausea/vomiting or stool incontinence Genito-Urinary ROS: negative for - dysuria, hematuria, incontinence or urinary frequency/urgency Musculoskeletal ROS: negative for - joint swelling or muscular weakness Neurological ROS: as noted in HPI Dermatological ROS: negative for rash and skin lesion changes  Physical Examination: Blood pressure 141/67, pulse 103, temperature 98.7 F (37.1 C), temperature source Oral, resp. rate 20, height 5' 1.5" (1.562 m), weight 127 lb 10.3 oz (57.9 kg), SpO2 97 %.   Neurological Examination Mental Status: Alert, oriented to name  Cranial Nerves: II: Discs flat bilaterally; Visual fields grossly normal, pupils equal, round, reactive to light and accommodation III,IV, VI: ptosis not present, extra-ocular motions intact bilaterally V,VII: smile symmetric, facial light touch sensation normal bilaterally VIII: hearing normal bilaterally IX,X: gag reflex present XI: bilateral shoulder shrug XII: midline tongue extension Motor: Right : Upper extremity   1/5    Left:     Upper extremity   5/5  Lower extremity   4/5     Lower extremity   5/5 Tone and bulk:normal tone throughout; no atrophy noted Sensory: Pinprick and light touch intact throughout, bilaterally Deep Tendon Reflexes: 1+ and symmetric throughout Plantars: Right: downgoing   Left: downgoing Cerebellar: normal finger-to-nose, normal rapid alternating movements and normal heel-to-shin test Gait: not tested      Laboratory Studies:   Basic Metabolic Panel:  Recent Labs Lab 08/11/15 1941 08/12/15 0534 08/12/15 1121 08/13/15 0521  NA 139 140  --  142  K 3.7 3.6  --  3.2*  CL 109 109  --  111  CO2 25 24  --  28  GLUCOSE 175*  188*  --  148*  BUN 14 14  --  11  CREATININE 0.82 0.82  --  0.74  CALCIUM 7.6* 7.5*  --  7.4*  MG  --   --  1.2* 2.6*    Liver Function Tests:  Recent Labs Lab 08/11/15 1941  AST 24  ALT 19  ALKPHOS 43  BILITOT <0.1*  PROT  5.5*  ALBUMIN 2.7*   No results for input(s): LIPASE, AMYLASE in the last 168 hours. No results for input(s): AMMONIA in the last 168 hours.  CBC:  Recent Labs Lab 08/11/15 1941 08/12/15 0028 08/12/15 0534 08/13/15 0521  WBC 13.3*  --  13.5* 7.5  HGB 9.7* 9.1* 8.8* 7.3*  HCT 29.3*  --  26.6* 22.2*  MCV 88.8  --  89.9 88.5  PLT 219  --  202 166    Cardiac Enzymes:  Recent Labs Lab 08/11/15 2255 08/12/15 0534 08/12/15 1121  TROPONINI <0.03 <0.03 <0.03    BNP: Invalid input(s): POCBNP  CBG:  Recent Labs Lab 08/11/15 0607 08/11/15 1122 08/12/15 0744 08/12/15 1119  GLUCAP 131* 166* 185* 125*    Microbiology: Results for orders placed or performed during the hospital encounter of 08/11/15  MRSA PCR Screening     Status: None   Collection Time: 08/11/15  5:49 PM  Result Value Ref Range Status   MRSA by PCR NEGATIVE NEGATIVE Final    Comment:        The GeneXpert MRSA Assay (FDA approved for NASAL specimens only), is one component of a comprehensive MRSA colonization surveillance program. It is not intended to diagnose MRSA infection nor to guide or monitor treatment for MRSA infections.     Coagulation Studies:  Recent Labs  08/11/15 1941  LABPROT 15.6*  INR 1.22    Urinalysis: No results for input(s): COLORURINE, LABSPEC, PHURINE, GLUCOSEU, HGBUR, BILIRUBINUR, KETONESUR, PROTEINUR, UROBILINOGEN, NITRITE, LEUKOCYTESUR in the last 168 hours.  Invalid input(s): APPERANCEUR  Lipid Panel:     Component Value Date/Time   CHOL 143 08/11/2015 1941   TRIG 72 08/11/2015 1941   HDL 41 08/11/2015 1941   CHOLHDL 3.5 08/11/2015 1941   VLDL 14 08/11/2015 1941   LDLCALC 88 08/11/2015 1941    HgbA1C: No results found for: HGBA1C  Urine Drug Screen:  No results found for: LABOPIA, COCAINSCRNUR, LABBENZ, AMPHETMU, THCU, LABBARB  Alcohol Level: No results for input(s): ETH in the last 168 hours.  Other results: EKG: normal EKG, normal sinus rhythm,  unchanged from previous tracings.  Imaging: Mr Herby Abraham Contrast  08/12/2015  CLINICAL DATA:  Recent LEFT carotid endarterectomy, requiring postoperative stent placement due to dissection with thrombus. Difficulty speaking, decreased level of consciousness. RIGHT hemiparesis. Initial encounter. EXAM: MRI HEAD WITHOUT CONTRAST TECHNIQUE: Multiplanar, multiecho pulse sequences of the brain and surrounding structures were obtained without intravenous contrast. COMPARISON:  CT angiography of the neck 08/11/2015. CT head 08/11/2015. Preoperative MRI brain 07/12/2015. FINDINGS: Multifocal areas of restricted diffusion, representing acute nonhemorrhagic infarction, affect the LEFT hemisphere. All lie within the LEFT MCA distribution. These include the LEFT posterior frontal cortex, LEFT parietal cortex, and adjacent subcortical white matter. Additional area of acute infarction affects the LEFT external capsule just lateral and inferior to the lentiform nucleus. Equivocal tiny areas of acute infarction affect the LEFT lateral occipital lobe and LEFT periventricular white matter. No abnormalities in the RIGHT hemisphere, brainstem, or cerebellum. Generalized atrophy. Moderate T2 and FLAIR hyperintensities throughout the periventricular and subcortical white matter representing chronic microvascular ischemic  change. No acute hemorrhage is observed. No mass lesion, or extra-axial fluid. Flow voids are maintained in the internal carotid arteries, basilar artery, both vertebral arteries. Specifically the LEFT cervical ICA is patent. In addition, there is no evidence for proximal LEFT middle cerebral artery disease. Unremarkable pituitary and cerebellar tonsils. Upper cervical region unremarkable. No sinus or mastoid disease. Soft tissue swelling in the LEFT neck. IMPRESSION: Multifocal areas of acute, nonhemorrhagic, LEFT hemisphere MCA territory infarction as described. Patency of the LEFT ICA and MCA is established.  Atrophy and small vessel disease. Electronically Signed   By: Staci Righter M.D.   On: 08/12/2015 17:08     Assessment/Plan:  80 y.o. female with past medical history significant for history of hypertension, hyperlipidemia, diabetes, dementia, COPD, ongoing tobacco abuse, also history of bilateral carotid artery stenosis who presents to the hospital due to syncope and was found to have left carotid stenosis of 90%. Patient underwent left carotid stenting after which patient developed right upper extremity weakness, so CT angiogram revealed dissection of proximal left common carotid artery, so another endovascular procedure was performed and stent was placed  Pt was found to have R sided weakness that has slightly improved.   MRI shows shows L hemispheric MCA infarcts likely post procedure that are embolic.  Still significant weakness proximally in RUE Appreciate PT- SNIFF placement, pt has not complaints. Please call with questions.   Leotis Pain  08/14/2015, 3:55 PM

## 2015-08-14 NOTE — Progress Notes (Signed)
Speech Language Pathology Treatment: Dysphagia  Patient Details Name: Jasmine Arias MRN: [id number] DOB: 06-21-1935 Today's Date: 08/14/2015 Time: 1020-1110 SLP Time Calculation (min) (ACUTE ONLY): 50 min  Assessment / Plan / Recommendation Clinical Impression  Pt seen for trials of po's in order to initiate a po diet consistency appropriate for her. Pt stated she was feeling "stronger" today; voice was strong and volume appropriate. Pt stated she was looking forward to eating/drinking. Pt consumed po trials of thin liquids and purees w/ no overt s/s of aspiration(coughing or throat clearing) noted; no oral phase deficits noted. Pt followed precautions using small, single bites and sips(via cup) for bolus control pharyngeally. Pt exhibited no increased pharyngeal residue and appeared to appropriate clear w/ single swallows. Pt fed self w/ support.  Rec. a Dys. 1 w/ thin liquids diet at this time w/ trials to upgrade tomorrow. Rec. Aspiration precautions; meds w/ puree for easier swallowing as nec. NSG updated. Pt/husband agreed.    HPI HPI: Jasmine Arias is a 80 y.o. female with a known history of hip retention, hyperlipidemia, diabetes, dementia, COPD, ongoing smoking, known bilateral carotid artery stenosis presented to the hospital secondary to a syncopal episode last week. She was seen by vascular and got admitted for an elective left carotid endarterectomy for 90% critical stenosis. The stent procedure patient had right arm weakness while in the recovery room and a stat CT angiogram showed a dissection and proximal left common carotid artery and so had another procedure endovascular stent was placed in the common carotid artery. Post surgery, pt presents w/ R UE weakness and min. difficulty swallowing per results of BSE and pt was rec'd to remain NPO w/ pleasure bites of applesauce (puree) and oral care until next tx session. MRI shows shows L hemispheric MCA infarcts likely post procedure.       SLP Plan  Continue with current plan of care     Recommendations  Diet recommendations: Dysphagia 1 (puree);Thin liquid Liquids provided via: Cup Medication Administration: Whole meds with puree Supervision: Staff to assist with self feeding (RUE weakness) Compensations: Slow rate;Small sips/bites;Follow solids with liquid Postural Changes and/or Swallow Maneuvers: Seated upright 90 degrees              General recommendations:  (TBD) Oral Care Recommendations: Oral care BID;Staff/trained caregiver to provide oral care Follow up Recommendations:  (TBD) Plan: Continue with current plan of care     Orinda Kenner, [place], CCC-SLP  Cydne Grahn 08/14/2015, 12:55 PM

## 2015-08-14 NOTE — Evaluation (Signed)
Occupational Therapy Evaluation Patient Details Name: Jasmine Arias MRN: [id number] DOB: March 09, 1935 Today's Date: 08/14/2015    History of Present Illness This patient is a 80 year old female who came to [facility] after a stent placement then R UE flaccidity.   Clinical Impression   80 year old female who came to [facility] with the above problems. She lives with her husband in a one story home with one step to enter.  She had been independent. She now needs assist and would benefit from Occupational Therapy for ADL/functioal mobility training and R UE restoration.     Follow Up Recommendations  SNF    Equipment Recommendations       Recommendations for Other Services       Precautions / Restrictions Restrictions Weight Bearing Restrictions: No      Mobility Bed Mobility Overal bed mobility: Modified Independent (using rails) Bed Mobility: Supine to Sit;Sit to Supine     Supine to sit: Modified independent (Device/Increase time) Sit to supine: Modified independent (Device/Increase time)      Transfers Overall transfer level: Modified independent (bed side commode) Equipment used:  (gate belt)                  Balance                                            ADL                                         General ADL Comments: Patient had been independent with ADL.  She now requires moderate assist for  lower extremity dressing. He can transfer to the bedside commode with contact guard assist.      Vision     Perception     Praxis      Pertinent Vitals/Pain Pain Assessment: No/denies pain     Hand Dominance Right   Extremity/Trunk Assessment Upper Extremity Assessment Upper Extremity Assessment:  (L UE shoulder flexion full range 3+/5 distal motions 4+/5 normal sensation.) RUE Deficits / Details: Active shoulder flexion/abduction 10o, 2-/5, elbow flexion 0-25o, 2-/5, elbow extension 1/5,  supination and pronation full range 2/5, pronation 2/5, wrist extension 25o, , wrist flexion to neutral 2/5. hand flexion and extension 20%. Mild deficit to light touch and sharp. Temp intact. Stereognosis unable to test secondary to poor manipulation in right hand.    Lower Extremity Assessment Lower Extremity Assessment: Defer to PT evaluation       Communication Communication Communication: No difficulties   Cognition Arousal/Alertness: Awake/alert Behavior During Therapy: WFL for tasks assessed/performed Overall Cognitive Status: Within Functional Limits for tasks assessed                     General Comments   Completed active assistive range of motion R upper extremity  shoulder flexion, external and internal rotation elbow flexion and extension, forearm supination and pronation, wrist flexion and extension, and hand flexion and extension 5 repetitions. Completed anti-edema massage to right hand (edema evident) and taught patient. Placed hand in elevation.      Exercises       Shoulder Instructions      Home Living Family/patient expects to be discharged to:: Private residence (willing to go to  rehab) Living Arrangements: Spouse/significant other Available Help at Discharge: Family   Home Access: Stairs to enter Entrance Stairs-Number of Steps: [place]: [place] - 2 wheels;Cane - single point          Prior Functioning/Environment Level of Independence: Independent             OT Diagnosis: Paresis   OT Problem List: Decreased strength;Decreased range of motion;Decreased activity tolerance;Decreased coordination;Decreased knowledge of use of DME or AE;Impaired sensation   OT Treatment/Interventions: Self-care/ADL training;Therapeutic exercise;Neuromuscular education    OT Goals(Current goals can be found in the care plan section) Acute Rehab OT Goals Patient Stated Goal: To use my arm. OT Goal Formulation: With  patient/family Time For Goal Achievement: 08/28/15 Potential to Achieve Goals: Good  OT Frequency: Min 1X/week   Barriers to D/C:            Co-evaluation              End of Session Equipment Utilized During Treatment: Gait belt  Activity Tolerance:   Patient left: in bed;with call bell/phone within reach;with bed alarm set;with family/visitor present   Time: 0957-1030 OT Time Calculation (min): 33 min Charges:  OT General Charges $OT Visit: 1 Procedure OT Evaluation $Initial OT Evaluation Tier I: 1 Procedure OT Treatments $Neuromuscular Re-education: 8-22 mins G-Codes:    Myrene Galas, MS/OTR/L  08/14/2015, 10:53 AM

## 2015-08-14 NOTE — Progress Notes (Signed)
Physical Therapy Treatment Patient Details Name: Jasmine Arias MRN: [id number] DOB: 11/22/1934 Today's Date: 08/14/2015    History of Present Illness This patient is a 80 year old female who came to [facility] after a stent placement then R UE flaccidity.    PT Comments    Pt with good tolerance to activity today.  Pt refused ambulation d/t increased dizziness, vitals checked 147/47, HR 106.  Pt was mod I supine to sit and took several steps to [facility].  Pt able to demonstrate good safety with transfer.  Pt required minA for toileting.  Pt sat at EOB able to tolerance gentle AA elbow flexion/ext, hand grips, wrist flexion/ext, shldr IR/ER x 5.  Also performed SLR x 10 and heels slides x 10 b/l.  Pt stated decreased dizziness at end of session.    Follow Up Recommendations  Home health PT;SNF     Equipment Recommendations  Other (comment) (may need sling)    Recommendations for Other Services       Precautions / Restrictions Precautions Precautions: None Restrictions Weight Bearing Restrictions: No    Mobility  Bed Mobility Overal bed mobility: Modified Independent (HOB elevaated and rails used) Bed Mobility: Supine to Sit;Sit to Supine     Supine to sit: Modified independent (Device/Increase time) Sit to supine: Modified independent (Device/Increase time)      Transfers Overall transfer level: Modified independent Equipment used:  (gate belt)             General transfer comment: Pt stood w/o AD, slow guarded movement  Ambulation/Gait             General Gait Details: Pt refused ambulation today d/t dizziness   Stairs            Wheelchair Mobility    Modified Rankin (Stroke Patients Only)       Balance Overall balance assessment: Modified Independent                                  Cognition Arousal/Alertness: Awake/alert Behavior During Therapy: WFL for tasks assessed/performed Overall Cognitive Status: Within Functional  Limits for tasks assessed                      Exercises General Exercises - Upper Extremity Elbow Flexion: AAROM;Right;5 reps Elbow Extension: AAROM;Right;5 reps Wrist Flexion: AAROM;Right;5 reps Other Exercises Other Exercises: Shldr IR/ER to neutral x 5    General Comments        Pertinent Vitals/Pain Pain Assessment: No/denies pain    Home Living Family/patient expects to be discharged to:: Private residence (willing to go to rehab) Living Arrangements: Spouse/significant other Available Help at Discharge: Family   Home Access: Stairs to enter     Home Equipment: Environmental consultant - 2 wheels;Cane - single point      Prior Function Level of Independence: Independent          PT Goals (current goals can now be found in the care plan section) Acute Rehab PT Goals Patient Stated Goal: I need to use the bathroom PT Goal Formulation: With patient Time For Goal Achievement: 08/27/15 Potential to Achieve Goals: Fair    Frequency       PT Plan      Co-evaluation             End of Session Equipment Utilized During Treatment: Gait belt Activity Tolerance: Patient tolerated treatment well Patient left:  in bed;with call bell/phone within reach;with bed alarm set     Time: 1143-1206 PT Time Calculation (min) (ACUTE ONLY): 23 min  Charges:  $Therapeutic Exercise: 8-22 mins $Therapeutic Activity: 8-22 mins                    G Codes:      Aalyah Mansouri 2015/08/22, 12:19 PM  Yuki Brunsman, PTA

## 2015-08-14 NOTE — Progress Notes (Signed)
DR Bridgett Larsson was made aware of pt's HR  130's with exertion and back to normal when at rest , no new order at this time, will endorsed to on coming nurse.

## 2015-08-14 NOTE — Progress Notes (Signed)
[facility] NAME: Jasmine Arias    MR#:  [id number]  DATE OF BIRTH:  1934-11-03  SUBJECTIVE:  CHIEF COMPLAINT:  I have right arm weakness Patient is 80 year old female with past medical history significant for history of hypertension, hyperlipidemia, diabetes, dementia, COPD, ongoing tobacco abuse, also history of bilateral carotid artery stenosis who presents to the hospital due to syncope and was found to have left carotid stenosis of 90%. Patient underwent left carotid stenting after which patient developed right upper extremity weakness, so CT angiogram revealed dissection of proximal left common carotid artery, so another endovascular procedure was performed and stent was placed. She feels satisfactory today. Admits of having right upper extremity weakness. However, it is improving. Right lower extremity is okay. Intermittent arrhythmias on telemetry with nonsustained V. tach as well as PVCs.  Further improving right upper extremity strength, no problems with his right lower extremity. The patient. She feels encouraged. Wants to eat. Evaluated by speech therapist and recommended dysphagia 1 diet . MRI of brain revealed multifocal areas of acute nonhemorrhagic left hemisphere MCA territory infarcts. Physical therapist recommends skilled nursing facility placement versus home health  Review of Systems  Constitutional: Negative for fever, chills and weight loss.  HENT: Negative for congestion.   Eyes: Negative for blurred vision and double vision.  Respiratory: Negative for cough, sputum production, shortness of breath and wheezing.   Cardiovascular: Negative for chest pain, palpitations, orthopnea, leg swelling and PND.  Gastrointestinal: Negative for nausea, vomiting, abdominal pain, diarrhea, constipation and blood in stool.  Genitourinary: Negative for dysuria, urgency, frequency and hematuria.  Musculoskeletal: Negative for  falls.  Neurological: Positive for focal weakness. Negative for dizziness, tremors and headaches.  Endo/Heme/Allergies: Does not bruise/bleed easily.  Psychiatric/Behavioral: Negative for depression. The patient does not have insomnia.     VITAL SIGNS: Blood pressure 141/67, pulse 103, temperature 98.7 F (37.1 C), temperature source Oral, resp. rate 20, height 5' 1.5" (1.562 m), weight 57.9 kg (127 lb 10.3 oz), SpO2 97 %.  PHYSICAL EXAMINATION:   GENERAL:  80 y.o.-year-old patient lying in the bed with no acute distress. Some slurring of speech EYES: Pupils equal, round, reactive to light and accommodation. No scleral icterus. Extraocular muscles intact.  HEENT: Head atraumatic, normocephalic. Oropharynx and nasopharynx clear.  NECK:  Supple, no jugular venous distention. No thyroid enlargement, no tenderness.  LUNGS: Normal breath sounds bilaterally, no wheezing, rales,rhonchi or crepitation. No use of accessory muscles of respiration.  CARDIOVASCULAR: S1, S2 normal. No murmurs, rubs, or gallops.  ABDOMEN: Soft, nontender, nondistended. Bowel sounds present. No organomegaly or mass.  EXTREMITIES: No pedal edema, cyanosis, or clubbing.  NEUROLOGIC: Cranial nerves II through XII are intact. Muscle strength 5/5 in all extremities. Sensation intact. Gait not checked. Right upper extremity weakness of 4 out of 5. Able to move her forearm and fingers, able to squeeze, improving strength of all. Right upper extremity swelling PSYCHIATRIC: The patient is alert and oriented x 3.  SKIN: No obvious rash, lesion, or ulcer.   ORDERS/RESULTS REVIEWED:   CBC  Recent Labs Lab 08/11/15 1941 08/12/15 0028 08/12/15 0534 08/13/15 0521  WBC 13.3*  --  13.5* 7.5  HGB 9.7* 9.1* 8.8* 7.3*  HCT 29.3*  --  26.6* 22.2*  PLT 219  --  202 166  MCV 88.8  --  89.9 88.5  MCH 29.3  --  29.7 29.0  MCHC 32.9  --  33.0 32.7  RDW 14.0  --  13.9 14.2    ------------------------------------------------------------------------------------------------------------------  Chemistries   Recent Labs Lab 08/11/15 1941 08/12/15 0534 08/12/15 1121 08/13/15 0521  NA 139 140  --  142  K 3.7 3.6  --  3.2*  CL 109 109  --  111  CO2 25 24  --  28  GLUCOSE 175* 188*  --  148*  BUN 14 14  --  11  CREATININE 0.82 0.82  --  0.74  CALCIUM 7.6* 7.5*  --  7.4*  MG  --   --  1.2* 2.6*  AST 24  --   --   --   ALT 19  --   --   --   ALKPHOS 43  --   --   --   BILITOT <0.1*  --   --   --    ------------------------------------------------------------------------------------------------------------------ estimated creatinine clearance is 45.6 mL/min (by C-G formula based on Cr of 0.74). ------------------------------------------------------------------------------------------------------------------ No results for input(s): TSH, T4TOTAL, T3FREE, THYROIDAB in the last 72 hours.  Invalid input(s): FREET3  Cardiac Enzymes  Recent Labs Lab 08/11/15 2255 08/12/15 0534 08/12/15 1121  TROPONINI <0.03 <0.03 <0.03   ------------------------------------------------------------------------------------------------------------------ Invalid input(s): POCBNP ---------------------------------------------------------------------------------------------------------------  RADIOLOGY: Mr Brain Wo Contrast  08/12/2015  CLINICAL DATA:  Recent LEFT carotid endarterectomy, requiring postoperative stent placement due to dissection with thrombus. Difficulty speaking, decreased level of consciousness. RIGHT hemiparesis. Initial encounter. EXAM: MRI HEAD WITHOUT CONTRAST TECHNIQUE: Multiplanar, multiecho pulse sequences of the brain and surrounding structures were obtained without intravenous contrast. COMPARISON:  CT angiography of the neck 08/11/2015. CT head 08/11/2015. Preoperative MRI brain 07/12/2015. FINDINGS: Multifocal areas of restricted diffusion,  representing acute nonhemorrhagic infarction, affect the LEFT hemisphere. All lie within the LEFT MCA distribution. These include the LEFT posterior frontal cortex, LEFT parietal cortex, and adjacent subcortical white matter. Additional area of acute infarction affects the LEFT external capsule just lateral and inferior to the lentiform nucleus. Equivocal tiny areas of acute infarction affect the LEFT lateral occipital lobe and LEFT periventricular white matter. No abnormalities in the RIGHT hemisphere, brainstem, or cerebellum. Generalized atrophy. Moderate T2 and FLAIR hyperintensities throughout the periventricular and subcortical white matter representing chronic microvascular ischemic change. No acute hemorrhage is observed. No mass lesion, or extra-axial fluid. Flow voids are maintained in the internal carotid arteries, basilar artery, both vertebral arteries. Specifically the LEFT cervical ICA is patent. In addition, there is no evidence for proximal LEFT middle cerebral artery disease. Unremarkable pituitary and cerebellar tonsils. Upper cervical region unremarkable. No sinus or mastoid disease. Soft tissue swelling in the LEFT neck. IMPRESSION: Multifocal areas of acute, nonhemorrhagic, LEFT hemisphere MCA territory infarction as described. Patency of the LEFT ICA and MCA is established. Atrophy and small vessel disease. Electronically Signed   By: Staci Righter M.D.   On: 08/12/2015 17:08    EKG:  Orders placed or performed during the hospital encounter of 08/11/15  . EKG 12-Lead  . EKG 12-Lead    ASSESSMENT AND PLAN:  Active Problems:   Carotid stenosis, symptomatic, with infarction ([place]) 1. Acute embolic stroke in left MCA territory with multifocal areas of infarcts with right sided weakness and aphasia, continue aspirin, Plavix, Lipitor. Physical therapy, occupational ,  speech therapy consulted, patient is recommended to go to skilled nursing facility for rehabilitation versus home health,  patient was initiated on dysphagia 1 diet today 2. Left carotid stenosis status post left carotid stent placement complicated by left common carotid artery dissection and repeated procedure with stent placement, continue aspirin  as well as Plavix.  stable 3. Nonsustained V. Tach, resolved. The magnesium level was checked, was found to be low at 1.2,  replenished  4. Hypomagnesemia , supplemented intravenously 5. Hypertension, essential, initiate Norvasc at 2.5 mg daily dose   Management plans discussed with the patient, family and they are in agreement.   DRUG ALLERGIES: No Known Allergies  CODE STATUS:     Code Status Orders        Start     Ordered   08/11/15 1434  Full code   Continuous     08/11/15 1433      TOTAL TIME TAKING CARE OF THIS PATIENT: 35 minutes.    Theodoro Grist M.D on 08/14/2015 at 3:55 PM  Between 7am to 6pm - Pager - [phone]  After 6pm go to [email or web address] - password [email or web address] [facility]  Office  [phone]  CC: Primary care physician; Park Liter, DO

## 2015-08-14 NOTE — Care Management (Addendum)
Patient admitted for elective left carotid endarterectomy with CorMatirix patch reconstruction.  Patient had critical left carotid stenosis.  After the procedure, patient developed right arm weakness caused by dissection/thrombus  requiring placement of a stent.  She was admitted to icu and transferred to 2A 11/13.  PT and OT have evaluated and informed that patient will require skilled nursing.  Informed that there has been improvement in patient's right arm-  it was initially "almost flaccid".  She was unable to ambulate with physical therapy today due to dizziness.  Place referral for CSW.  Attending anticipates discharge maybe with the next 24 hours

## 2015-08-14 NOTE — Progress Notes (Signed)
Famotidine IV to PO   Patient qualified for IV to PO transition per IV to PO policy. Will transition patient to PO therapy.    Larene Beach, PharmD

## 2015-08-15 LAB — HEMOGLOBIN: Hemoglobin: 7.4 g/dL — ABNORMAL LOW (ref 12.0–16.0)

## 2015-08-15 MED ORDER — POTASSIUM CHLORIDE 20 MEQ PO PACK
40.0000 meq | PACK | Freq: Once | ORAL | Status: AC
  Administered 2015-08-15: 40 meq via ORAL
  Filled 2015-08-15: qty 2

## 2015-08-15 MED ORDER — METOPROLOL TARTRATE 25 MG PO TABS
12.5000 mg | ORAL_TABLET | Freq: Two times a day (BID) | ORAL | Status: DC
  Administered 2015-08-15 – 2015-08-16 (×3): 12.5 mg via ORAL
  Filled 2015-08-15 (×3): qty 1

## 2015-08-15 NOTE — Care Management (Signed)
Patient will proceed to skilled nursing once cleared by vascular.

## 2015-08-15 NOTE — Progress Notes (Signed)
Speech Language Pathology Treatment: Dysphagia  Patient Details Name: Jasmine Arias MRN: [id number] DOB: 1935/06/18 Today's Date: 08/15/2015 Time: [id number] SLP Time Calculation (min) (ACUTE ONLY): 20 min  Assessment / Plan / Recommendation Clinical Impression  Pt consumed po trials of thin liquids and purees, and then dysphagia 2 consistency trials of chicken salad and green beans. Pt had one coughing episode, but also reported a baseline cough -- suspect cough not related to PO intake. No other overt s/s of aspiration (coughing or throat clearing) noted; no oral phase deficits noted. Pt followed precautions using small, single bites and sips for bolus control. Pt consumed all liquids via a straw today and appeared to tolerate intake with no overt or immediate s/s of aspiration across trials. Pt was able to feed self with set-up. Based on toleration of trials of dysphagia 2 diet consistency, ST upgraded diet recommendation to dys 2 with thin liquids (straw now ok).  ST will continue to monitor and will downgrade diet if necessary (or will consider an objective swallow study if warranted). Pt reminded of aspiration precautions and instructed to use strict aspiration precautions during all PO intake -- especially the use of a lingual sweep and liquid wash down to ensure oral clearing. NSG updated.   HPI HPI: Jasmine Arias is a 80 y.o. female with a known history of hip retention, hyperlipidemia, diabetes, dementia, COPD, ongoing smoking, known bilateral carotid artery stenosis presented to the hospital secondary to a syncopal episode last week. She was seen by vascular and got admitted for an elective left carotid endarterectomy for 90% critical stenosis. The stent procedure patient had right arm weakness while in the recovery room and a stat CT angiogram showed a dissection and proximal left common carotid artery and so had another procedure endovascular stent was placed in the common carotid artery.  Post surgery, pt presents w/ R UE weakness and min. difficulty swallowing per results of BSE and pt was rec'd to remain NPO w/ pleasure bites of applesauce (puree) and oral care until next tx session. MRI shows shows L hemispheric MCA infarcts likely post procedure.      SLP Plan  Continue with current plan of care     Recommendations  Diet recommendations: Dysphagia 2 (fine chop);Thin liquid Liquids provided via: Straw Medication Administration: Whole meds with puree Supervision: Staff to assist with self feeding Compensations: Slow rate;Small sips/bites;Follow solids with liquid Postural Changes and/or Swallow Maneuvers: Seated upright 90 degrees              Oral Care Recommendations: Oral care BID;Staff/trained caregiver to provide oral care Follow up Recommendations:  (TBD) Plan: Continue with current plan of care   Jasmine Arias 08/15/2015, 12:52 PM

## 2015-08-15 NOTE — Clinical Social Work Note (Signed)
Clinical Social Work Assessment  Patient Details  Name: Jasmine Arias MRN: [id number] Date of Birth: May 02, 1935  Date of referral:  08/15/15               Reason for consult:  Facility Placement                Permission sought to share information with:  Case Manager, Customer service manager, Family Supports Permission granted to share information::  Yes, Verbal Permission Granted  Name::     Husband Engineer, building services::  SNFs  Relationship::     Contact Information:     Housing/Transportation Living arrangements for the past 2 months:  [place] of Information:  Patient, Medical Team, Spouse Patient Interpreter Needed:  None Criminal Activity/Legal Involvement Pertinent to Current Situation/Hospitalization:  No - Comment as needed Significant Relationships:  Adult Children, Spouse Lives with:  Spouse Do you feel safe going back to the place where you live?  No Need for family participation in patient care:  Yes (Comment)  Care giving concerns:  Pt lives at home with her husband.    Social Worker assessment / plan:  CSW was referred to Pt to assist with dc planning. Pt lives with her husband of 66 years and has a daughter who lives in [place]. Pt retired from working line work years ago. She is now requiring skilled nursing for rehab at discharge. CSW spoke with Pt and her husband regarding transition to SNF. Pt possibly cleared for dc today. CSW will fax out Pt's information and update Pt's husband with bed offers.   Employment status:  Retired Nurse, adult PT Recommendations:  [place] / Referral to community resources:  [place]  Patient/Family's Response to care:  Pt and her husband are engaged with tx providers. Pt's husband supportive of Pt's needs.   Patient/Family's Understanding of and Emotional Response to Diagnosis, Current Treatment, and Prognosis:  Pt and her husband are in  agreement with SNF at dc. They understand that this is a short-term rehab and that she will go home from SNF.   Emotional Assessment Appearance:  Appears stated age Attitude/Demeanor/Rapport:   (engaged) Affect (typically observed):  Accepting, Adaptable Orientation:  Oriented to Self, Oriented to Place, Oriented to  Time, Oriented to Situation Alcohol / Substance use:  Never Used Psych involvement (Current and /or in the community):  No (Comment)  Discharge Needs  Concerns to be addressed:  Adjustment to Illness Readmission within the last 30 days:    Current discharge risk:  Dependent with Mobility Barriers to Discharge:  Barriers Resolved   Alonna Buckler, LCSW 08/15/2015, 10:38 AM

## 2015-08-15 NOTE — NC FL2 (Signed)
[place] LEVEL OF CARE SCREENING TOOL     IDENTIFICATION  Patient Name: Jasmine Arias Birthdate: 09-06-1935 Sex: female Admission Date (Current Location): 08/11/2015  [place] and [place] Number: [facility] and Address:  [facility], [address] [place], [place] [address]      Provider Number: [id number]  Attending Physician Name and Address:  Katha Cabal, MD  Relative Name and Phone Number:       Current Level of Care: Hospital Recommended Level of Care: [place] Prior Approval Number:    Date Approved/Denied:   PASRR Number: [id number]  Discharge Plan: SNF    Current Diagnoses: Patient Active Problem List   Diagnosis Date Noted  . Carotid stenosis, symptomatic, with infarction ([place]) 08/11/2015  . Mixed Alzheimer's and vascular dementia 07/10/2015  . B12 deficiency 07/10/2015  . Malnutrition ([place]) 05/11/2015  . Dementia 05/11/2015  . Type 2 diabetes mellitus with background retinopathy ([place]) 05/11/2015  . Vertigo 05/11/2015  . HLD (hyperlipidemia) 05/11/2015  . Essential hypertension 05/11/2015  . Multiple thyroid nodules 05/11/2015  . Carotid stenosis 05/11/2015  . Aortic atherosclerosis ([place]) 05/11/2015  . Polycythemia 02/12/2014  . Hyperthyroidism, subclinical 02/12/2014  . Cardiac murmur 02/11/2014  . Carotid artery bruit 02/11/2014  . Fast heart beat 02/11/2014  . Lung mass 06/10/2012  . Compulsive tobacco user syndrome 06/09/2012    Orientation ACTIVITIES/SOCIAL BLADDER RESPIRATION    Self, Time, Situation, Place  Family supportive, Active Continent O2 (As needed)  BEHAVIORAL SYMPTOMS/MOOD NEUROLOGICAL BOWEL NUTRITION STATUS      Continent Diet (DY1 thin liquids)  PHYSICIAN VISITS COMMUNICATION OF NEEDS Height & Weight Skin  30 days Verbally   127 lbs.            AMBULATORY STATUS RESPIRATION    Assist extensive O2 (As needed)      Personal Care  Assistance Level of Assistance  Bathing, Dressing Bathing Assistance: Limited assistance   Dressing Assistance: Limited assistance      Functional Limitations Info                [place]  PT (By licensed PT), Speech therapy, OT (By licensed OT)     PT Frequency: 5x week OT Frequency: 3x week     Speech Therapy Frequency: 2x week     Additional Factors Info                  Current Medications (08/15/2015): Current Facility-Administered Medications  Medication Dose Route Frequency Provider Last Rate Last Dose  . acetaminophen (TYLENOL) tablet 325-650 mg  325-650 mg Oral Q4H PRN Katha Cabal, MD       Or  . acetaminophen (TYLENOL) suppository 325-650 mg  325-650 mg Rectal Q4H PRN Katha Cabal, MD      . alum & mag hydroxide-simeth (MAALOX/MYLANTA) 200-200-20 MG/5ML suspension 15-30 mL  15-30 mL Oral Q2H PRN Katha Cabal, MD      . amLODipine (NORVASC) tablet 2.5 mg  2.5 mg Oral Daily Theodoro Grist, MD   2.5 mg at 08/15/15 0948  . antiseptic oral rinse (CPC / CETYLPYRIDINIUM CHLORIDE 0.05%) solution 7 mL  7 mL Mouth Rinse q12n4p Katha Cabal, MD   7 mL at 08/14/15 1600  . aspirin EC tablet 81 mg  81 mg Oral Daily Katha Cabal, MD   81 mg at 08/15/15 0951  . atorvastatin (LIPITOR) tablet 80 mg  80 mg Oral q1800 Radhika  Kalisetti, MD   80 mg at 08/14/15 1715  . chlorhexidine (PERIDEX) 0.12 % solution 15 mL  15 mL Mouth Rinse BID Katha Cabal, MD   15 mL at 08/15/15 [id number]  . clopidogrel (PLAVIX) tablet 75 mg  75 mg Oral Q breakfast Katha Cabal, MD   75 mg at 08/15/15 0947  . docusate sodium (COLACE) capsule 100 mg  100 mg Oral Daily Katha Cabal, MD   100 mg at 08/14/15 0908  . donepezil (ARICEPT) tablet 10 mg  10 mg Oral QHS Katha Cabal, MD   10 mg at 08/14/15 2113  . famotidine (PEPCID) tablet 20 mg  20 mg Oral BID Katha Cabal, MD   20 mg at 08/15/15 0948  . feeding supplement (ENSURE ENLIVE)  (ENSURE ENLIVE) liquid 237 mL  237 mL Oral BID BM Katha Cabal, MD   237 mL at 08/15/15 0949  . guaiFENesin-dextromethorphan (ROBITUSSIN DM) 100-10 MG/5ML syrup 15 mL  15 mL Oral Q4H PRN Katha Cabal, MD      . labetalol (NORMODYNE,TRANDATE) injection 10 mg  10 mg Intravenous Q10 min PRN Katha Cabal, MD   10 mg at 08/12/15 0223  . LORazepam (ATIVAN) tablet 0.5 mg  0.5 mg Oral Q6H PRN Lance Coon, MD      . magnesium sulfate IVPB 2 g 50 mL  2 g Intravenous Daily PRN Katha Cabal, MD      . metoprolol (LOPRESSOR) injection 2-5 mg  2-5 mg Intravenous Q2H PRN Katha Cabal, MD      . mometasone-formoterol [facility]) 100-5 MCG/ACT inhaler 2 puff  2 puff Inhalation BID Katha Cabal, MD   2 puff at 08/15/15 [phone]  . morphine 2 MG/ML injection 2-5 mg  2-5 mg Intravenous Q1H PRN Katha Cabal, MD   2 mg at 08/11/15 2221  . nicotine (NICODERM CQ - dosed in mg/24 hours) patch 21 mg  21 mg Transdermal Daily Gladstone Lighter, MD   21 mg at 08/15/15 0949  . ondansetron (ZOFRAN) injection 4 mg  4 mg Intravenous Q6H PRN Katha Cabal, MD   4 mg at 08/11/15 2224  . oxyCODONE-acetaminophen (PERCOCET/ROXICET) 5-325 MG per tablet 1-2 tablet  1-2 tablet Oral Q4H PRN Katha Cabal, MD      . phenol (CHLORASEPTIC) mouth spray 1 spray  1 spray Mouth/Throat PRN Katha Cabal, MD      . potassium chloride SA (K-DUR,KLOR-CON) CR tablet 20-40 mEq  20-40 mEq Oral Daily PRN Katha Cabal, MD      . promethazine [facility]) injection 12.5 mg  12.5 mg Intravenous Q6H PRN Lance Coon, MD   12.5 mg at 08/11/15 2307  . sertraline (ZOLOFT) tablet 50 mg  50 mg Oral QHS Katha Cabal, MD   50 mg at 08/14/15 2114   Do not use this list as official medication orders. Please verify with discharge summary.  Discharge Medications:   Medication List    ASK your doctor about these medications        atorvastatin 20 MG tablet  Commonly known as:  LIPITOR  Take 20 mg by mouth daily.      donepezil 10 MG tablet  Commonly known as:  ARICEPT  Take 1 tablet (10 mg total) by mouth at bedtime.     ENSURE NUTRITION SHAKE Liqd  Take 8 oz by mouth 2 (two) times daily.     Fluticasone-Salmeterol 100-50 MCG/DOSE Aepb  Commonly known as:  ADVAIR  Inhale 1 puff into the lungs 2 (two) times daily.     hydrochlorothiazide 25 MG tablet  Commonly known as:  HYDRODIURIL  Take 1 tablet (25 mg total) by mouth daily.     metFORMIN 500 MG tablet  Commonly known as:  GLUCOPHAGE  Take 500 mg by mouth 2 (two) times daily with a meal.     sertraline 50 MG tablet  Commonly known as:  ZOLOFT  Take 1 tablet (50 mg total) by mouth at bedtime.        Relevant Imaging Results:  Relevant Lab Results:  Recent Labs    Additional Information   SSN [id number]   Alonna Buckler, LCSW

## 2015-08-15 NOTE — Progress Notes (Signed)
[place] [facility] [place] NAME: Jasmine Arias    MR#:  [id number]  DATE OF BIRTH:  05-06-1935  SUBJECTIVE:  CHIEF COMPLAINT:  I have right arm weakness Patient is 80 year old female with past medical history significant for history of hypertension, hyperlipidemia, diabetes, dementia, COPD, ongoing tobacco abuse, also history of bilateral carotid artery stenosis who presents to the hospital due to syncope and was found to have left carotid stenosis of 90%. Patient underwent left carotid stenting after which patient developed right upper extremity weakness, so CT angiogram revealed dissection of proximal left common carotid artery, so another endovascular procedure was performed and stent was placed. She feels satisfactory today. Admits of having right upper extremity weakness. However, it is improving. Right lower extremity is okay. Intermittent arrhythmias on telemetry with nonsustained V. tach as well as PVCs.  Further improving right upper extremity strength, no problems with his right lower extremity. The patient. She feels encouraged. Wants to eat. Evaluated by speech therapist and recommended dysphagia 1 diet . MRI of brain revealed multifocal areas of acute nonhemorrhagic left hemisphere MCA territory infarcts. Physical therapist recommends skilled nursing facility placement versus home health. Speech therapist recommended dysphagia 2 with thin liquids  Review of Systems  Constitutional: Negative for fever, chills and weight loss.  HENT: Negative for congestion.   Eyes: Negative for blurred vision and double vision.  Respiratory: Negative for cough, sputum production, shortness of breath and wheezing.   Cardiovascular: Negative for chest pain, palpitations, orthopnea, leg swelling and PND.  Gastrointestinal: Negative for nausea, vomiting, abdominal pain, diarrhea, constipation and blood in stool.  Genitourinary: Negative for dysuria, urgency,  frequency and hematuria.  Musculoskeletal: Negative for falls.  Neurological: Positive for focal weakness. Negative for dizziness, tremors and headaches.  Endo/Heme/Allergies: Does not bruise/bleed easily.  Psychiatric/Behavioral: Negative for depression. The patient does not have insomnia.     VITAL SIGNS: Blood pressure 153/61, pulse 108, temperature 98.3 F (36.8 C), temperature source Oral, resp. rate 19, height 5' 1.5" (1.562 m), weight 57.9 kg (127 lb 10.3 oz), SpO2 97 %.  PHYSICAL EXAMINATION:   GENERAL:  80 y.o.-year-old patient lying in the bed with no acute distress. Some slurring of speech remains EYES: Pupils equal, round, reactive to light and accommodation. No scleral icterus. Extraocular muscles intact.  HEENT: Head atraumatic, normocephalic. Oropharynx and nasopharynx clear.  NECK:  Supple, no jugular venous distention. No thyroid enlargement, no tenderness.  LUNGS: Normal breath sounds bilaterally, no wheezing, rales,rhonchi or crepitation. No use of accessory muscles of respiration.  CARDIOVASCULAR: S1, S2 normal. No murmurs, rubs, or gallops.  ABDOMEN: Soft, nontender, nondistended. Bowel sounds present. No organomegaly or mass.  EXTREMITIES: No pedal edema, cyanosis, or clubbing.  NEUROLOGIC: Cranial nerves II through XII are intact. Muscle strength 5/5 in all extremities. Sensation intact. Gait not checked. Right upper extremity weakness of 4 out of 5. Able to move her forearm and fingers, able to squeeze, improving strength of all. Right upper extremity swelling has subsided. Still significant difficulty moving right upper extremity PSYCHIATRIC: The patient is alert and oriented x 3.  SKIN: No obvious rash, lesion, or ulcer.   ORDERS/RESULTS REVIEWED:   CBC  Recent Labs Lab 08/11/15 1941 08/12/15 0028 08/12/15 0534 08/13/15 0521 08/15/15 0440  WBC 13.3*  --  13.5* 7.5  --   HGB 9.7* 9.1* 8.8* 7.3* 7.4*  HCT 29.3*  --  26.6* 22.2*  --   PLT 219  --  202 166   --  MCV 88.8  --  89.9 88.5  --   MCH 29.3  --  29.7 29.0  --   MCHC 32.9  --  33.0 32.7  --   RDW 14.0  --  13.9 14.2  --    ------------------------------------------------------------------------------------------------------------------  Chemistries   Recent Labs Lab 08/11/15 1941 08/12/15 0534 08/12/15 1121 08/13/15 0521  NA 139 140  --  142  K 3.7 3.6  --  3.2*  CL 109 109  --  111  CO2 25 24  --  28  GLUCOSE 175* 188*  --  148*  BUN 14 14  --  11  CREATININE 0.82 0.82  --  0.74  CALCIUM 7.6* 7.5*  --  7.4*  MG  --   --  1.2* 2.6*  AST 24  --   --   --   ALT 19  --   --   --   ALKPHOS 43  --   --   --   BILITOT <0.1*  --   --   --    ------------------------------------------------------------------------------------------------------------------ estimated creatinine clearance is 45.6 mL/min (by C-G formula based on Cr of 0.74). ------------------------------------------------------------------------------------------------------------------ No results for input(s): TSH, T4TOTAL, T3FREE, THYROIDAB in the last 72 hours.  Invalid input(s): FREET3  Cardiac Enzymes  Recent Labs Lab 08/11/15 2255 08/12/15 0534 08/12/15 1121  TROPONINI <0.03 <0.03 <0.03   ------------------------------------------------------------------------------------------------------------------ Invalid input(s): POCBNP ---------------------------------------------------------------------------------------------------------------  RADIOLOGY: No results found.  EKG:  Orders placed or performed during the hospital encounter of 08/11/15  . EKG 12-Lead  . EKG 12-Lead    ASSESSMENT AND PLAN:  Active Problems:   Carotid stenosis, symptomatic, with infarction ([place]) 1. Acute embolic stroke in left MCA territory with multifocal areas of infarcts with right sided weakness and aphasia, improving clinically , continue aspirin, Plavix, Lipitor. Physical therapy, occupational ,  speech therapy  consulted, patient is recommended to go to skilled nursing facility for rehabilitation versus home health, patient was advanced to  dysphagia 2 diet with thin liquids today 2. Left carotid stenosis status post left carotid stent placement complicated by left common carotid artery dissection and repeated procedure with stent placement, continue aspirin as well as Plavix.  Stable clinically. Restless surgery will determine discharge plan.  3. Nonsustained V. Tach, resolved. The magnesium level was checked, was found to be low at 1.2,  Replenished. Initiate patient on low dose metoprolol 4. Hypomagnesemia , supplemented intravenously 5. Hypertension, essential, change Norvasc at 2.5 mg to  Metoprolol. Goal systolic blood pressure is 140-160 6. Sinus tachycardia. Initiate metoprolol at low dose to stabilize myocardium Patient is currently stable to be discharged home with home health versus rehabilitation facility, discussed with patient's husband  Management plans discussed with the patient, family and they are in agreement.   DRUG ALLERGIES: No Known Allergies  CODE STATUS:     Code Status Orders        Start     Ordered   08/11/15 1434  Full code   Continuous     08/11/15 1433      TOTAL TIME TAKING CARE OF THIS PATIENT: 40 minutes.  Coordination of care time 10 minutes  Jaisean Monteforte M.D on 08/15/2015 at 2:26 PM  Between 7am to 6pm - Pager - [phone]  After 6pm go to [email or web address] - password [email or web address] [facility]  Office  [phone]  CC: Primary care physician; Park Liter, DO

## 2015-08-15 NOTE — Progress Notes (Signed)
CSW following for dc planning. Pt's husband has accepted SNF bed at [facility]. SNF bed is ready for Pt once medically cleared.   Toma Copier, [place]

## 2015-08-15 NOTE — Progress Notes (Signed)
    Subjective  - POD #4  Feels better this morning   Physical Exam:  Right arm strength is even more improved today with some use of biceps.  Grip strength contniues to improve improved.       Assessment/Plan:  POD #4 Appreciate neurology consult.  MRI shows left hemispheric infarcts.  Continue aspirin and Plavix Evaluated by speech therapy diet to be advanced.   Seen by physical therapy and occupational therapy will need inpatient rehab  Katha Cabal 08/15/2015 8:11 PM --  Danley Danker Vitals:   08/15/15 1924  BP: 136/83  Pulse: 101  Temp: 98.5 F (36.9 C)  Resp: 18    Intake/Output Summary (Last 24 hours) at 08/15/15 2011 Last data filed at 08/15/15 1758  Gross per 24 hour  Intake    360 ml  Output   1900 ml  Net  -1540 ml     Laboratory CBC    Component Value Date/Time   WBC 7.5 08/13/2015 0521   WBC 6.9 05/11/2015 1355   WBC 6.1 10/02/2014 1332   HGB 7.4* 08/15/2015 0440   HGB 13.7 10/02/2014 1332   HCT 22.2* 08/13/2015 0521   HCT 43.6 05/11/2015 1355   HCT 42.0 10/02/2014 1332   PLT 166 08/13/2015 0521   PLT 249 10/02/2014 1332    BMET    Component Value Date/Time   NA 142 08/13/2015 0521   NA 143 05/11/2015 1355   NA 139 10/02/2014 1332   K 3.2* 08/13/2015 0521   K 3.5 10/02/2014 1332   CL 111 08/13/2015 0521   CL 104 10/02/2014 1332   CO2 28 08/13/2015 0521   CO2 28 10/02/2014 1332   GLUCOSE 148* 08/13/2015 0521   GLUCOSE 57* 05/11/2015 1355   GLUCOSE 133* 10/02/2014 1332   BUN 11 08/13/2015 0521   BUN 14 05/11/2015 1355   BUN 16 10/02/2014 1332   CREATININE 0.74 08/13/2015 0521   CREATININE 1.09 10/02/2014 1332   CALCIUM 7.4* 08/13/2015 0521   CALCIUM 9.1 10/02/2014 1332   GFRNONAA >60 08/13/2015 0521   GFRNONAA 52* 10/02/2014 1332   GFRAA >60 08/13/2015 0521   GFRAA >60 10/02/2014 1332    COAG Lab Results  Component Value Date   INR 1.22 08/11/2015   INR 1.03 08/04/2015   No results found for:  PTT  Antibiotics Anti-infectives    Start     Dose/Rate Route Frequency Ordered Stop   08/11/15 1445  cefUROXime (ZINACEF) 1.5 g in dextrose 5 % 50 mL IVPB     1.5 g 100 mL/hr over 30 Minutes Intravenous Every 12 hours 08/11/15 1433 08/12/15 0230   08/11/15 1445  vancomycin (VANCOCIN) IVPB 1000 mg/200 mL premix     1,000 mg 200 mL/hr over 60 Minutes Intravenous Every 12 hours 08/11/15 1433 08/12/15 0429   08/11/15 0549  ceFAZolin (ANCEF) 2-3 GM-% IVPB SOLR    Comments:  Ronnell Freshwater: cabinet override      08/11/15 0549 08/11/15 1759   08/11/15 0346  ceFAZolin (ANCEF) IVPB 2 g/50 mL premix  Status:  Discontinued     2 g 100 mL/hr over 30 Minutes Intravenous On call to O.R. 08/11/15 0346 08/11/15 1125

## 2015-08-15 NOTE — Clinical Social Work Placement (Signed)
   CLINICAL SOCIAL WORK PLACEMENT  NOTE  Date:  08/15/2015  Patient Details  Name: Jasmine Arias MRN: [id number] Date of Birth: [birth year]  Clinical Social Work is seeking post-discharge placement for this patient at the [place] level of care (*CSW will initial, date and re-position this form in  chart as items are completed):      Patient/family provided with [place] Work Department's list of facilities offering this level of care within the geographic area requested by the patient (or if unable, by the patient's family).  Yes   Patient/family informed of their freedom to choose among providers that offer the needed level of care, that participate in Medicare, Medicaid or managed care program needed by the patient, have an available bed and are willing to accept the patient.  Yes   Patient/family informed of [place]'s ownership interest in [facility] and [facility], as well as of the fact that they are under no obligation to receive care at these facilities.  PASRR submitted to EDS on 08/15/15     PASRR number received on 08/15/15     Existing PASRR number confirmed on       FL2 transmitted to all facilities in geographic area requested by pt/family on       FL2 transmitted to all facilities within larger geographic area on       Patient informed that his/her managed care company has contracts with or will negotiate with certain facilities, including the following:            Patient/family informed of bed offers received.  Patient chooses bed at       Physician recommends and patient chooses bed at      Patient to be transferred to   on  .  Patient to be transferred to facility by       Patient family notified on   of transfer.  Name of family member notified:        PHYSICIAN       Additional Comment:    _______________________________________________ Alonna Buckler, LCSW 08/15/2015, 12:58 PM

## 2015-08-16 ENCOUNTER — Encounter: Payer: Self-pay | Admitting: Vascular Surgery

## 2015-08-16 DIAGNOSIS — M6281 Muscle weakness (generalized): Secondary | ICD-10-CM | POA: Diagnosis not present

## 2015-08-16 DIAGNOSIS — Z48812 Encounter for surgical aftercare following surgery on the circulatory system: Secondary | ICD-10-CM | POA: Diagnosis not present

## 2015-08-16 DIAGNOSIS — E119 Type 2 diabetes mellitus without complications: Secondary | ICD-10-CM | POA: Diagnosis not present

## 2015-08-16 DIAGNOSIS — E785 Hyperlipidemia, unspecified: Secondary | ICD-10-CM | POA: Diagnosis not present

## 2015-08-16 DIAGNOSIS — I10 Essential (primary) hypertension: Secondary | ICD-10-CM | POA: Diagnosis not present

## 2015-08-16 DIAGNOSIS — I6522 Occlusion and stenosis of left carotid artery: Secondary | ICD-10-CM | POA: Diagnosis not present

## 2015-08-16 DIAGNOSIS — F419 Anxiety disorder, unspecified: Secondary | ICD-10-CM | POA: Diagnosis not present

## 2015-08-16 DIAGNOSIS — I472 Ventricular tachycardia: Secondary | ICD-10-CM | POA: Diagnosis not present

## 2015-08-16 DIAGNOSIS — K5900 Constipation, unspecified: Secondary | ICD-10-CM | POA: Diagnosis not present

## 2015-08-16 DIAGNOSIS — F339 Major depressive disorder, recurrent, unspecified: Secondary | ICD-10-CM | POA: Diagnosis not present

## 2015-08-16 DIAGNOSIS — I6523 Occlusion and stenosis of bilateral carotid arteries: Secondary | ICD-10-CM | POA: Diagnosis not present

## 2015-08-16 DIAGNOSIS — I69351 Hemiplegia and hemiparesis following cerebral infarction affecting right dominant side: Secondary | ICD-10-CM | POA: Diagnosis not present

## 2015-08-16 DIAGNOSIS — R05 Cough: Secondary | ICD-10-CM | POA: Diagnosis not present

## 2015-08-16 DIAGNOSIS — I7771 Dissection of carotid artery: Secondary | ICD-10-CM | POA: Diagnosis not present

## 2015-08-16 DIAGNOSIS — J449 Chronic obstructive pulmonary disease, unspecified: Secondary | ICD-10-CM | POA: Diagnosis not present

## 2015-08-16 DIAGNOSIS — G308 Other Alzheimer's disease: Secondary | ICD-10-CM | POA: Diagnosis not present

## 2015-08-16 DIAGNOSIS — R601 Generalized edema: Secondary | ICD-10-CM | POA: Diagnosis not present

## 2015-08-16 DIAGNOSIS — R41841 Cognitive communication deficit: Secondary | ICD-10-CM | POA: Diagnosis not present

## 2015-08-16 DIAGNOSIS — J45901 Unspecified asthma with (acute) exacerbation: Secondary | ICD-10-CM | POA: Diagnosis not present

## 2015-08-16 DIAGNOSIS — I639 Cerebral infarction, unspecified: Secondary | ICD-10-CM | POA: Diagnosis not present

## 2015-08-16 DIAGNOSIS — F0150 Vascular dementia without behavioral disturbance: Secondary | ICD-10-CM | POA: Diagnosis not present

## 2015-08-16 DIAGNOSIS — Z720 Tobacco use: Secondary | ICD-10-CM | POA: Diagnosis not present

## 2015-08-16 DIAGNOSIS — R278 Other lack of coordination: Secondary | ICD-10-CM | POA: Diagnosis not present

## 2015-08-16 MED ORDER — FAMOTIDINE 20 MG PO TABS: 20.0000 mg | ORAL_TABLET | Freq: Two times a day (BID) | ORAL | Status: DC

## 2015-08-16 MED ORDER — GUAIFENESIN-DM 100-10 MG/5ML PO SYRP: 15.0000 mL | ORAL_SOLUTION | ORAL | Status: DC | PRN

## 2015-08-16 MED ORDER — NICOTINE 21 MG/24HR TD PT24: 21.0000 mg | MEDICATED_PATCH | Freq: Every day | TRANSDERMAL | Status: DC

## 2015-08-16 MED ORDER — CLOPIDOGREL BISULFATE 75 MG PO TABS: 75.0000 mg | ORAL_TABLET | Freq: Every day | ORAL | Status: DC

## 2015-08-16 MED ORDER — LORAZEPAM 0.5 MG PO TABS: 0.5000 mg | ORAL_TABLET | Freq: Four times a day (QID) | ORAL | Status: DC | PRN

## 2015-08-16 MED ORDER — ATORVASTATIN CALCIUM 80 MG PO TABS: 80.0000 mg | ORAL_TABLET | Freq: Every day | ORAL | Status: DC

## 2015-08-16 MED ORDER — MORPHINE SULFATE (PF) 2 MG/ML IV SOLN: 2.0000 mg | INTRAVENOUS | Status: DC | PRN

## 2015-08-16 MED ORDER — SERTRALINE HCL 50 MG PO TABS: 50.0000 mg | ORAL_TABLET | Freq: Every day | ORAL | Status: DC

## 2015-08-16 MED ORDER — CHLORHEXIDINE GLUCONATE 0.12 % MT SOLN: 15.0000 mL | Freq: Two times a day (BID) | OROMUCOSAL | Status: DC

## 2015-08-16 MED ORDER — ENSURE ENLIVE PO LIQD: 237.0000 mL | Freq: Two times a day (BID) | ORAL | Status: DC

## 2015-08-16 MED ORDER — ACETAMINOPHEN 325 MG PO TABS: 325.0000 mg | ORAL_TABLET | ORAL | Status: DC | PRN

## 2015-08-16 MED ORDER — ASPIRIN 81 MG PO TBEC: 81.0000 mg | DELAYED_RELEASE_TABLET | Freq: Every day | ORAL | Status: DC

## 2015-08-16 MED ORDER — DOCUSATE SODIUM 100 MG PO CAPS: 100.0000 mg | ORAL_CAPSULE | Freq: Every day | ORAL | Status: DC

## 2015-08-16 MED ORDER — METOPROLOL TARTRATE 25 MG PO TABS: 12.5000 mg | ORAL_TABLET | Freq: Two times a day (BID) | ORAL | Status: DC

## 2015-08-16 MED ORDER — MOMETASONE FURO-FORMOTEROL FUM 100-5 MCG/ACT IN AERO: 2.0000 | INHALATION_SPRAY | Freq: Two times a day (BID) | RESPIRATORY_TRACT | Status: DC

## 2015-08-16 MED ORDER — ALUM & MAG HYDROXIDE-SIMETH 200-200-20 MG/5ML PO SUSP: 15.0000 mL | ORAL | Status: DC | PRN

## 2015-08-16 MED ORDER — DONEPEZIL HCL 10 MG PO TABS: 10.0000 mg | ORAL_TABLET | Freq: Every day | ORAL | Status: DC

## 2015-08-16 MED ORDER — CETYLPYRIDINIUM CHLORIDE 0.05 % MT LIQD: 7.0000 mL | Freq: Two times a day (BID) | OROMUCOSAL | Status: DC

## 2015-08-16 NOTE — Discharge Summary (Signed)
[facility]    Discharge Summary    Patient ID:  Jasmine Arias MRN: [id number] DOB/AGE: 78-Dec-1936 78 y.o.  Admit date: 08/11/2015 Discharge date: 08/16/2015 Date of Surgery: 08/11/2015 Surgeon: Surgeon(s): Katha Cabal, MD  Admission Diagnosis: CAROTID ARTERY STENOSIS SP carotid endarectomy  Discharge Diagnoses:  CAROTID ARTERY STENOSIS SP carotid endarectomy  Secondary Diagnoses: Past Medical History  Diagnosis Date  . Hyperlipidemia   . Hypertension   . Diabetes mellitus without complication ([place])   . Memory changes   . COPD (chronic obstructive pulmonary disease) ([place])   . Anxiety   . Indigestion     Procedure(s): ENDARTERECTOMY CAROTID  Discharged Condition: stable  HPI and hospital course:  Patient is a 80 year old woman who presented to the office back in April with a recent episode of gait disturbance and the question of some speech disturbance as well as syncope. Workup including CT angiography demonstrated a greater than 90% stenosis of the left carotid artery and surgery was recommended at that time. The patient elected not to pursue surgery and subsequently returned to the office this October stating that she had reconsidered and wishes to proceed.  After appropriate cardiac clearance was obtained patient was scheduled for left carotid endarterectomy and she was not a candidate for stent based on the extensive calcifications of her lesion as well as other anatomic considerations including the length of the densely calcified lesion. She was felt to be a reasonable candidate for surgery although she did have a fairly high lesion.  On the day of admission she underwent left carotid endarterectomy with a core matrix patch. She was awakened in the operating room moving all 4 extremities and following simple commands. About an hour later in the recovery room she was noted to have lost motor function of the right arm CT angiography  was emergently obtained which demonstrated an apparent dissection with some thrombus in the common carotid several centimeters below the patch.  The patient was subsequently taken emergently to angiography where a Viabahn stent was used to treat this dissection and thrombus she did well from this there were no further neurological changes she maintained fluent speech throughout this procedure and subsequently was taken to the intensive care unit where she was given Aggrastat overnight. By the next morning she did have motor function of the right arm although it is somewhat weak. She was converted from the Aggrastat to Plavix and aspirin. She was transferred from the unit to the floor. Over the course of the next several days she has undergone physical therapy she has had continued and steady improvement in the motor function of her right arm. Her speech has been fluent her her swallowing study was normal and she is done quite well. On hospital day #5 she is felt to be a good candidate for discharge to rehabilitation as she isn't demonstrating steady improvement in her right arm function  Hospital Course:  Jasmine Arias is a 80 y.o. female is S/P Left carotid endarterectomy 1 Procedure(s): ENDARTERECTOMY CAROTID Extubated: POD # 0 Physical exam: Left neck clean dry and intact no evidence of hematoma right arm demonstrates good grip strength there is weakness of the biceps and triceps her shoulder and deltoid muscles appear to be intact Post-op wounds clean, dry, intact or healing well Pt. Ambulating, voiding and taking PO diet without difficulty. Pt pain controlled with PO pain meds. Labs as below Complications: Stroke following carotid endarterectomy  Consults:  Treatment Team:  Leotis Pain,  MD Theodoro Grist, MD  Significant Diagnostic Studies: CBC Lab Results  Component Value Date   WBC 7.5 08/13/2015   HGB 7.4* 08/15/2015   HCT 22.2* 08/13/2015   MCV 88.5 08/13/2015   PLT 166  08/13/2015    BMET    Component Value Date/Time   NA 142 08/13/2015 0521   NA 143 05/11/2015 1355   NA 139 10/02/2014 1332   K 3.2* 08/13/2015 0521   K 3.5 10/02/2014 1332   CL 111 08/13/2015 0521   CL 104 10/02/2014 1332   CO2 28 08/13/2015 0521   CO2 28 10/02/2014 1332   GLUCOSE 148* 08/13/2015 0521   GLUCOSE 57* 05/11/2015 1355   GLUCOSE 133* 10/02/2014 1332   BUN 11 08/13/2015 0521   BUN 14 05/11/2015 1355   BUN 16 10/02/2014 1332   CREATININE 0.74 08/13/2015 0521   CREATININE 1.09 10/02/2014 1332   CALCIUM 7.4* 08/13/2015 0521   CALCIUM 9.1 10/02/2014 1332   GFRNONAA >60 08/13/2015 0521   GFRNONAA 52* 10/02/2014 1332   GFRAA >60 08/13/2015 0521   GFRAA >60 10/02/2014 1332   COAG Lab Results  Component Value Date   INR 1.22 08/11/2015   INR 1.03 08/04/2015     Disposition:  Discharge to :Rehab    Medication List    ASK your doctor about these medications        atorvastatin 20 MG tablet  Commonly known as:  LIPITOR  Take 20 mg by mouth daily.     donepezil 10 MG tablet  Commonly known as:  ARICEPT  Take 1 tablet (10 mg total) by mouth at bedtime.     ENSURE NUTRITION SHAKE Liqd  Take 8 oz by mouth 2 (two) times daily.     Fluticasone-Salmeterol 100-50 MCG/DOSE Aepb  Commonly known as:  ADVAIR  Inhale 1 puff into the lungs 2 (two) times daily.     hydrochlorothiazide 25 MG tablet  Commonly known as:  HYDRODIURIL  Take 1 tablet (25 mg total) by mouth daily.     metFORMIN 500 MG tablet  Commonly known as:  GLUCOPHAGE  Take 500 mg by mouth 2 (two) times daily with a meal.     sertraline 50 MG tablet  Commonly known as:  ZOLOFT  Take 1 tablet (50 mg total) by mouth at bedtime.       Verbal and written Discharge instructions given to the patient. Wound care per Discharge AVS   Signed: Katha Cabal, MD  08/16/2015, 11:43 AM

## 2015-08-16 NOTE — Progress Notes (Signed)
[place] [facility] [place] NAME: Jasmine Arias    MR#:  [id number]  DATE OF BIRTH:  12-Jan-1935  SUBJECTIVE:  CHIEF COMPLAINT:  I have right arm weakness Patient is 80 year old female with past medical history significant for history of hypertension, hyperlipidemia, diabetes, dementia, COPD, ongoing tobacco abuse, also history of bilateral carotid artery stenosis who presents to the hospital due to syncope and was found to have left carotid stenosis of 90%. Patient underwent left carotid stenting after which patient developed right upper extremity weakness, so CT angiogram revealed dissection of proximal left common carotid artery, so another endovascular procedure was performed and stent was placed. She feels satisfactory today. Admits of having right upper extremity weakness. However, it is improving. Right lower extremity is okay. Intermittent arrhythmias on telemetry with nonsustained V. tach as well as PVCs.  Further improving right upper extremity strength, no problems with his right lower extremity. The patient. She feels encouraged. Wants to eat. Evaluated by speech therapist and recommended dysphagia 1 diet . MRI of brain revealed multifocal areas of acute nonhemorrhagic left hemisphere MCA territory infarcts. Physical therapist recommends skilled nursing facility placement versus home health. Speech therapist recommended dysphagia 2 with thin liquids Patient feels relatively well today. She is to go to home with home health today  Review of Systems  Constitutional: Negative for fever, chills and weight loss.  HENT: Negative for congestion.   Eyes: Negative for blurred vision and double vision.  Respiratory: Negative for cough, sputum production, shortness of breath and wheezing.   Cardiovascular: Negative for chest pain, palpitations, orthopnea, leg swelling and PND.  Gastrointestinal: Negative for nausea, vomiting, abdominal pain, diarrhea,  constipation and blood in stool.  Genitourinary: Negative for dysuria, urgency, frequency and hematuria.  Musculoskeletal: Negative for falls.  Neurological: Positive for focal weakness. Negative for dizziness, tremors and headaches.  Endo/Heme/Allergies: Does not bruise/bleed easily.  Psychiatric/Behavioral: Negative for depression. The patient does not have insomnia.     VITAL SIGNS: Blood pressure 95/76, pulse 92, temperature 98.1 F (36.7 C), temperature source Oral, resp. rate 18, height 5' 1.5" (1.562 m), weight 57.9 kg (127 lb 10.3 oz), SpO2 98 %.  PHYSICAL EXAMINATION:   GENERAL:  80 y.o.-year-old patient lying in the bed with no acute distress. Some slurring of speech remains EYES: Pupils equal, round, reactive to light and accommodation. No scleral icterus. Extraocular muscles intact.  HEENT: Head atraumatic, normocephalic. Oropharynx and nasopharynx clear.  NECK:  Supple, no jugular venous distention. No thyroid enlargement, no tenderness.  LUNGS: Normal breath sounds bilaterally, no wheezing, rales,rhonchi or crepitation. No use of accessory muscles of respiration.  CARDIOVASCULAR: S1, S2 normal. No murmurs, rubs, or gallops.  ABDOMEN: Soft, nontender, nondistended. Bowel sounds present. No organomegaly or mass.  EXTREMITIES: No pedal edema, cyanosis, or clubbing.  NEUROLOGIC: Cranial nerves II through XII are intact. Muscle strength 5/5 in all extremities. Sensation intact. Gait not checked. Right upper extremity weakness of 4 out of 5. Able to move her forearm and fingers, able to squeeze, improving strength of all. Right upper extremity swelling has subsided. Still significant difficulty moving right upper extremity PSYCHIATRIC: The patient is alert and oriented x 3.  SKIN: No obvious rash, lesion, or ulcer.   ORDERS/RESULTS REVIEWED:   CBC  Recent Labs Lab 08/11/15 1941 08/12/15 0028 08/12/15 0534 08/13/15 0521 08/15/15 0440  WBC 13.3*  --  13.5* 7.5  --   HGB  9.7* 9.1* 8.8* 7.3* 7.4*  HCT 29.3*  --  26.6* 22.2*  --   PLT 219  --  202 166  --   MCV 88.8  --  89.9 88.5  --   MCH 29.3  --  29.7 29.0  --   MCHC 32.9  --  33.0 32.7  --   RDW 14.0  --  13.9 14.2  --    ------------------------------------------------------------------------------------------------------------------  Chemistries   Recent Labs Lab 08/11/15 1941 08/12/15 0534 08/12/15 1121 08/13/15 0521  NA 139 140  --  142  K 3.7 3.6  --  3.2*  CL 109 109  --  111  CO2 25 24  --  28  GLUCOSE 175* 188*  --  148*  BUN 14 14  --  11  CREATININE 0.82 0.82  --  0.74  CALCIUM 7.6* 7.5*  --  7.4*  MG  --   --  1.2* 2.6*  AST 24  --   --   --   ALT 19  --   --   --   ALKPHOS 43  --   --   --   BILITOT <0.1*  --   --   --    ------------------------------------------------------------------------------------------------------------------ estimated creatinine clearance is 45.6 mL/min (by C-G formula based on Cr of 0.74). ------------------------------------------------------------------------------------------------------------------ No results for input(s): TSH, T4TOTAL, T3FREE, THYROIDAB in the last 72 hours.  Invalid input(s): FREET3  Cardiac Enzymes  Recent Labs Lab 08/11/15 2255 08/12/15 0534 08/12/15 1121  TROPONINI <0.03 <0.03 <0.03   ------------------------------------------------------------------------------------------------------------------ Invalid input(s): POCBNP ---------------------------------------------------------------------------------------------------------------  RADIOLOGY: No results found.  EKG:  Orders placed or performed during the hospital encounter of 08/11/15  . EKG 12-Lead  . EKG 12-Lead    ASSESSMENT AND PLAN:  Active Problems:   Carotid stenosis, symptomatic, with infarction ([place]) 1. Acute embolic stroke in left MCA territory with multifocal areas of infarcts with right sided weakness and aphasia, improving clinically ,  continue aspirin, Plavix, Lipitor. Physical therapy, occupational ,  speech therapy consulted, patient is recommended to go to skilled nursing facility for rehabilitation versus home health, patient was advanced to  dysphagia 2 diet with thin liquids today 2. Left carotid stenosis status post left carotid stent placement complicated by left common carotid artery dissection and repeated procedure with stent placement, continue aspirin as well as Plavix.  Stable clinically. Restless surgery will determine discharge plan.  3. Nonsustained V. Tach, resolved. The magnesium level was checked, was found to be low at 1.2,  Replenished. Initiate patient on low dose metoprolol 4. Hypomagnesemia , supplemented intravenously 5. Hypertension, essential, now on  Metoprolol, blood pressure is some what led lower, may need to decrease metoprolol dose since goal systolic blood pressure is 140-160 6. Sinus tachycardia. Would recommend to continue low-dose of metoprolol at low dose to stabilize myocardium   Management plans discussed with the patient, family and they are in agreement.   DRUG ALLERGIES: No Known Allergies  CODE STATUS:     Code Status Orders        Start     Ordered   08/11/15 1434  Full code   Continuous     08/11/15 1433      TOTAL TIME TAKING CARE OF THIS PATIENT: 30 minutes.    Theodoro Grist M.D on 08/16/2015 at 3:25 PM  Between 7am to 6pm - Pager - [phone]  After 6pm go to [email or web address] - password [email or web address] [facility]  Office  [phone]  CC: Primary care physician; Park Liter, DO

## 2015-08-16 NOTE — Care Management (Signed)
Reached out to vascular for medical clearance for discharge to skilled nursing today

## 2015-08-16 NOTE — Progress Notes (Signed)
Physical Therapy Treatment Patient Details Name: Jasmine Arias MRN: [id number] DOB: [birth year] Today's Date: 08/16/2015    History of Present Illness This patient is a 80 year old female who came to [facility] after a stent placement then R UE flaccidity.    PT Comments    Pt shows good effort with ambulation and shows increased R UE swing/control/tone.  She has good confidence with balance and while walking and is able to do some AROM R UE activities today.  Pt with some minimal fatigue with ambulation and is not at her baseline (largely due to R UE), but overall has made considerable increases with R UE and therefore general functional improvement.   Follow Up Recommendations  SNF;Home health PT     Equipment Recommendations       Recommendations for Other Services       Precautions / Restrictions Precautions Precautions: None Restrictions Weight Bearing Restrictions: No    Mobility  Bed Mobility Overal bed mobility: Modified Independent Bed Mobility: Supine to Sit;Sit to Supine     Supine to sit: Modified independent (Device/Increase time)     General bed mobility comments: Pt able to get to EOB w/o assist, she still needs to be cautious with getting R UE caught  Transfers Overall transfer level: Modified independent Equipment used: None             General transfer comment: Pt is able to get to standing w/o heavy UE use and generally shows good safety and confidence.  Ambulation/Gait Ambulation/Gait assistance: Supervision Ambulation Distance (Feet): 200 Feet Assistive device: None       General Gait Details: Pt does well with ambulation and was actually able to walk with good speed and confidence around the nurses station.  Her R UE still dangling with minimal control, though it appears to have some tone and therefore does not swing flaccidly.   Stairs            Wheelchair Mobility    Modified Rankin (Stroke Patients Only)       Balance                                     Cognition Arousal/Alertness: Awake/alert Behavior During Therapy: WFL for tasks assessed/performed Overall Cognitive Status: Within Functional Limits for tasks assessed                      Exercises General Exercises - Upper Extremity Elbow Flexion: AROM;10 reps;Right Elbow Extension: AROM;10 reps;Right Hand Exercises Forearm Supination: AROM;10 reps;Right Forearm Pronation: AROM;Right;10 reps Other Exercises Other Exercises: SLR x10 b/l Other Exercises: Heel slides x10 b/l  Other Exercises: hip ab/ad X 10 b/l    General Comments        Pertinent Vitals/Pain Pain Assessment: No/denies pain    Home Living                      Prior Function            PT Goals (current goals can now be found in the care plan section) Progress towards PT goals: Progressing toward goals    Frequency  Min 2X/week    PT Plan Current plan remains appropriate    Co-evaluation             End of Session Equipment Utilized During Treatment: Gait belt Activity Tolerance: Patient tolerated treatment well Patient  left: with bed alarm set     Time: 1329-1350 PT Time Calculation (min) (ACUTE ONLY): 21 min  Charges:  $Gait Training: 8-22 mins $Therapeutic Exercise: 8-22 mins                    G Codes:     Jasmine Arias, PT, DPT [phone]  Jasmine Arias 08/16/2015, 4:37 PM

## 2015-08-16 NOTE — Progress Notes (Signed)
Spoke with [place], [facility] rep at [phone], to notify of non-emergent EMS transport.  Auth notification reference given as [id number].   Service date range good from 08/16/15 - 11/14/15.   Gap exception requested to determine if services can be considered at an in-network level.

## 2015-08-16 NOTE — Clinical Social Work Placement (Signed)
   CLINICAL SOCIAL WORK PLACEMENT  NOTE  Date:  08/16/2015  Patient Details  Name: Jasmine Arias MRN: [id number] Date of Birth: 1935-07-20  Clinical Social Work is seeking post-discharge placement for this patient at the [place] level of care (*CSW will initial, date and re-position this form in  chart as items are completed):      Patient/family provided with [place] Work Department's list of facilities offering this level of care within the geographic area requested by the patient (or if unable, by the patient's family).  Yes   Patient/family informed of their freedom to choose among providers that offer the needed level of care, that participate in Medicare, Medicaid or managed care program needed by the patient, have an available bed and are willing to accept the patient.  Yes   Patient/family informed of [facility]'s ownership interest in [facility] and [facility], as well as of the fact that they are under no obligation to receive care at these facilities.  PASRR submitted to EDS on 08/15/15     PASRR number received on 08/15/15     Existing PASRR number confirmed on       FL2 transmitted to all facilities in geographic area requested by pt/family on 08/15/15     FL2 transmitted to all facilities within larger geographic area on       Patient informed that his/her managed care company has contracts with or will negotiate with certain facilities, including the following:        Yes   Patient/family informed of bed offers received.  Patient chooses bed at  [facility])     Physician recommends and patient chooses bed at      Patient to be transferred to  [facility]) on 08/16/15.  Patient to be transferred to facility by EMS     Patient family notified on 08/16/15 of transfer.  Name of family member notified:  husband Fritz Pickerel      PHYSICIAN       Additional Comment:     _______________________________________________ Alonna Buckler, LCSW 08/16/2015, 4:42 PM

## 2015-08-16 NOTE — Progress Notes (Signed)
CSW was notified that Pt has been cleared for dc to SNF [facility]. Pt's husband at hospital earlier in the day but is now at home and will meet Pt at SNF at dc.  CSW sent dc summary, instructions, and dc packet. RN to call report and EMS for transfer. No further CSW needs at this time.  Toma Copier, [place]

## 2015-09-04 DIAGNOSIS — I6523 Occlusion and stenosis of bilateral carotid arteries: Secondary | ICD-10-CM | POA: Diagnosis not present

## 2015-09-08 ENCOUNTER — Observation Stay
Admit: 2015-09-08 | Discharge: 2015-09-08 | Disposition: A | Discharge status: Home / Self Care | Payer: Medicare Other | Attending: Internal Medicine | Admitting: Internal Medicine

## 2015-09-08 ENCOUNTER — Observation Stay
Admission: EM | Admit: 2015-09-08 | Discharge: 2015-09-10 | Disposition: A | Discharge status: Home / Self Care | Payer: Medicare Other | Attending: Internal Medicine | Admitting: Internal Medicine

## 2015-09-08 ENCOUNTER — Ambulatory Visit (INDEPENDENT_AMBULATORY_CARE_PROVIDER_SITE_OTHER): Payer: Medicare Other | Admitting: Family Medicine

## 2015-09-08 ENCOUNTER — Emergency Department: Payer: Medicare Other

## 2015-09-08 ENCOUNTER — Encounter: Payer: Self-pay | Admitting: Family Medicine

## 2015-09-08 VITALS — BP 135/82 | HR 101 | Temp 97.4°F | Ht 61.0 in | Wt 112.1 lb

## 2015-09-08 DIAGNOSIS — R55 Syncope and collapse: Secondary | ICD-10-CM | POA: Diagnosis not present

## 2015-09-08 DIAGNOSIS — I69351 Hemiplegia and hemiparesis following cerebral infarction affecting right dominant side: Secondary | ICD-10-CM | POA: Insufficient documentation

## 2015-09-08 DIAGNOSIS — E042 Nontoxic multinodular goiter: Secondary | ICD-10-CM | POA: Diagnosis not present

## 2015-09-08 DIAGNOSIS — I4892 Unspecified atrial flutter: Secondary | ICD-10-CM | POA: Diagnosis not present

## 2015-09-08 DIAGNOSIS — I071 Rheumatic tricuspid insufficiency: Secondary | ICD-10-CM | POA: Insufficient documentation

## 2015-09-08 DIAGNOSIS — I739 Peripheral vascular disease, unspecified: Secondary | ICD-10-CM | POA: Insufficient documentation

## 2015-09-08 DIAGNOSIS — Z79899 Other long term (current) drug therapy: Secondary | ICD-10-CM | POA: Diagnosis not present

## 2015-09-08 DIAGNOSIS — R008 Other abnormalities of heart beat: Secondary | ICD-10-CM | POA: Diagnosis not present

## 2015-09-08 DIAGNOSIS — E46 Unspecified protein-calorie malnutrition: Secondary | ICD-10-CM | POA: Insufficient documentation

## 2015-09-08 DIAGNOSIS — R0989 Other specified symptoms and signs involving the circulatory and respiratory systems: Secondary | ICD-10-CM

## 2015-09-08 DIAGNOSIS — I69339 Monoplegia of upper limb following cerebral infarction affecting unspecified side: Secondary | ICD-10-CM

## 2015-09-08 DIAGNOSIS — I10 Essential (primary) hypertension: Secondary | ICD-10-CM | POA: Insufficient documentation

## 2015-09-08 DIAGNOSIS — D509 Iron deficiency anemia, unspecified: Secondary | ICD-10-CM | POA: Insufficient documentation

## 2015-09-08 DIAGNOSIS — R413 Other amnesia: Secondary | ICD-10-CM | POA: Diagnosis not present

## 2015-09-08 DIAGNOSIS — R918 Other nonspecific abnormal finding of lung field: Secondary | ICD-10-CM | POA: Diagnosis not present

## 2015-09-08 DIAGNOSIS — E876 Hypokalemia: Secondary | ICD-10-CM | POA: Diagnosis not present

## 2015-09-08 DIAGNOSIS — M4802 Spinal stenosis, cervical region: Secondary | ICD-10-CM | POA: Insufficient documentation

## 2015-09-08 DIAGNOSIS — E538 Deficiency of other specified B group vitamins: Secondary | ICD-10-CM | POA: Diagnosis not present

## 2015-09-08 DIAGNOSIS — I6521 Occlusion and stenosis of right carotid artery: Secondary | ICD-10-CM | POA: Diagnosis not present

## 2015-09-08 DIAGNOSIS — I340 Nonrheumatic mitral (valve) insufficiency: Secondary | ICD-10-CM | POA: Insufficient documentation

## 2015-09-08 DIAGNOSIS — E11319 Type 2 diabetes mellitus with unspecified diabetic retinopathy without macular edema: Secondary | ICD-10-CM | POA: Insufficient documentation

## 2015-09-08 DIAGNOSIS — F0150 Vascular dementia without behavioral disturbance: Secondary | ICD-10-CM | POA: Diagnosis not present

## 2015-09-08 DIAGNOSIS — F419 Anxiety disorder, unspecified: Secondary | ICD-10-CM | POA: Diagnosis not present

## 2015-09-08 DIAGNOSIS — E785 Hyperlipidemia, unspecified: Secondary | ICD-10-CM | POA: Insufficient documentation

## 2015-09-08 DIAGNOSIS — E119 Type 2 diabetes mellitus without complications: Secondary | ICD-10-CM | POA: Diagnosis not present

## 2015-09-08 DIAGNOSIS — I498 Other specified cardiac arrhythmias: Secondary | ICD-10-CM

## 2015-09-08 DIAGNOSIS — G309 Alzheimer's disease, unspecified: Secondary | ICD-10-CM | POA: Insufficient documentation

## 2015-09-08 DIAGNOSIS — Z8673 Personal history of transient ischemic attack (TIA), and cerebral infarction without residual deficits: Secondary | ICD-10-CM | POA: Diagnosis not present

## 2015-09-08 DIAGNOSIS — F17200 Nicotine dependence, unspecified, uncomplicated: Secondary | ICD-10-CM | POA: Diagnosis not present

## 2015-09-08 DIAGNOSIS — R42 Dizziness and giddiness: Secondary | ICD-10-CM | POA: Insufficient documentation

## 2015-09-08 DIAGNOSIS — E0590 Thyrotoxicosis, unspecified without thyrotoxic crisis or storm: Secondary | ICD-10-CM | POA: Diagnosis not present

## 2015-09-08 DIAGNOSIS — R531 Weakness: Secondary | ICD-10-CM | POA: Diagnosis not present

## 2015-09-08 DIAGNOSIS — Z7982 Long term (current) use of aspirin: Secondary | ICD-10-CM | POA: Diagnosis not present

## 2015-09-08 DIAGNOSIS — I493 Ventricular premature depolarization: Secondary | ICD-10-CM | POA: Insufficient documentation

## 2015-09-08 DIAGNOSIS — D751 Secondary polycythemia: Secondary | ICD-10-CM | POA: Diagnosis not present

## 2015-09-08 DIAGNOSIS — R011 Cardiac murmur, unspecified: Secondary | ICD-10-CM | POA: Insufficient documentation

## 2015-09-08 DIAGNOSIS — I63239 Cerebral infarction due to unspecified occlusion or stenosis of unspecified carotid arteries: Secondary | ICD-10-CM | POA: Diagnosis not present

## 2015-09-08 DIAGNOSIS — I499 Cardiac arrhythmia, unspecified: Secondary | ICD-10-CM | POA: Diagnosis not present

## 2015-09-08 DIAGNOSIS — Z7984 Long term (current) use of oral hypoglycemic drugs: Secondary | ICD-10-CM | POA: Diagnosis not present

## 2015-09-08 DIAGNOSIS — IMO0002 Reserved for concepts with insufficient information to code with codable children: Secondary | ICD-10-CM

## 2015-09-08 DIAGNOSIS — I700 Atherosclerosis of aorta: Secondary | ICD-10-CM | POA: Insufficient documentation

## 2015-09-08 DIAGNOSIS — J449 Chronic obstructive pulmonary disease, unspecified: Secondary | ICD-10-CM | POA: Diagnosis not present

## 2015-09-08 DIAGNOSIS — I6930 Unspecified sequelae of cerebral infarction: Secondary | ICD-10-CM | POA: Insufficient documentation

## 2015-09-08 HISTORY — DX: Unspecified dementia, unspecified severity, without behavioral disturbance, psychotic disturbance, mood disturbance, and anxiety: F03.90

## 2015-09-08 HISTORY — DX: Anemia, unspecified: D64.9

## 2015-09-08 HISTORY — DX: Syncope and collapse: R55

## 2015-09-08 LAB — GLUCOSE, CAPILLARY
Glucose-Capillary: 114 mg/dL — ABNORMAL HIGH (ref 65–99)
Glucose-Capillary: 148 mg/dL — ABNORMAL HIGH (ref 65–99)

## 2015-09-08 LAB — CBC
HCT: 30.2 % — ABNORMAL LOW (ref 35.0–47.0)
HCT: 28.6 % — ABNORMAL LOW (ref 35.0–47.0)
HEMOGLOBIN: 9.4 g/dL — AB (ref 12.0–16.0)
Hemoglobin: 9.3 g/dL — ABNORMAL LOW (ref 12.0–16.0)
MCH: 26.4 pg (ref 26.0–34.0)
MCH: 27 pg (ref 26.0–34.0)
MCHC: 31.2 g/dL — ABNORMAL LOW (ref 32.0–36.0)
MCHC: 32.5 g/dL (ref 32.0–36.0)
MCV: 84.5 fL (ref 80.0–100.0)
MCV: 83.1 fL (ref 80.0–100.0)
PLATELETS: 287 10*3/uL (ref 150–440)
PLATELETS: 249 10*3/uL (ref 150–440)
RBC: 3.57 MIL/uL — AB (ref 3.80–5.20)
RBC: 3.44 MIL/uL — ABNORMAL LOW (ref 3.80–5.20)
RDW: 16.2 % — ABNORMAL HIGH (ref 11.5–14.5)
RDW: 16.3 % — AB (ref 11.5–14.5)
WBC: 7.6 10*3/uL (ref 3.6–11.0)
WBC: 7.1 10*3/uL (ref 3.6–11.0)

## 2015-09-08 LAB — BASIC METABOLIC PANEL
Anion gap: 7 (ref 5–15)
BUN: 15 mg/dL (ref 6–20)
CHLORIDE: 103 mmol/L (ref 101–111)
CO2: 31 mmol/L (ref 22–32)
CREATININE: 0.88 mg/dL (ref 0.44–1.00)
Calcium: 9.3 mg/dL (ref 8.9–10.3)
GFR calc non Af Amer: >60 mL/min (ref >60)
GLUCOSE: 172 mg/dL — AB (ref 65–99)
Potassium: 3.4 mmol/L — ABNORMAL LOW (ref 3.5–5.1)
Sodium: 141 mmol/L (ref 135–145)

## 2015-09-08 LAB — IRON AND TIBC
Iron: 18 ug/dL — ABNORMAL LOW (ref 28–170)
Saturation Ratios: 5 % — ABNORMAL LOW (ref 10.4–31.8)
TIBC: 352 ug/dL (ref 250–450)
UIBC: 334 ug/dL

## 2015-09-08 LAB — FOLATE: FOLATE: 17.3 ng/mL (ref >5.9)

## 2015-09-08 LAB — URINALYSIS COMPLETE WITH MICROSCOPIC (ARMC ONLY)
Bacteria, UA: NONE SEEN
Bilirubin Urine: NEGATIVE
Glucose, UA: NEGATIVE mg/dL
Hgb urine dipstick: NEGATIVE
Leukocytes, UA: NEGATIVE
Nitrite: NEGATIVE
PH: 5 (ref 5.0–8.0)
PROTEIN: 30 mg/dL — AB
Specific Gravity, Urine: 1.023 (ref 1.005–1.030)

## 2015-09-08 LAB — TROPONIN I
Troponin I: <0.03 ng/mL (ref <0.031)
Troponin I: <0.03 ng/mL (ref <0.031)

## 2015-09-08 LAB — RETICULOCYTES
RBC.: 3.44 MIL/uL — AB (ref 3.80–5.20)
RETIC COUNT ABSOLUTE: 65.4 10*3/uL (ref 19.0–183.0)
Retic Ct Pct: 1.9 % (ref 0.4–3.1)

## 2015-09-08 LAB — CREATININE, SERUM
Creatinine, Ser: 0.8 mg/dL (ref 0.44–1.00)
GFR calc non Af Amer: >60 mL/min (ref >60)

## 2015-09-08 LAB — FERRITIN: Ferritin: 10 ng/mL — ABNORMAL LOW (ref 11–307)

## 2015-09-08 LAB — GLUCOSE HEMOCUE WAIVED: Glu Hemocue Waived: 161 mg/dL — ABNORMAL HIGH (ref 65–99)

## 2015-09-08 LAB — MAGNESIUM: MAGNESIUM: 1.3 mg/dL — AB (ref 1.7–2.4)

## 2015-09-08 MED ORDER — MOMETASONE FURO-FORMOTEROL FUM 100-5 MCG/ACT IN AERO
2.0000 | INHALATION_SPRAY | Freq: Two times a day (BID) | RESPIRATORY_TRACT | Status: DC
  Administered 2015-09-08 – 2015-09-09 (×2): 2 via RESPIRATORY_TRACT
  Filled 2015-09-08: qty 8.8

## 2015-09-08 MED ORDER — SODIUM CHLORIDE 0.9 % IJ SOLN
3.0000 mL | Freq: Two times a day (BID) | INTRAMUSCULAR | Status: DC
  Administered 2015-09-09 – 2015-09-10 (×3): 3 mL via INTRAVENOUS

## 2015-09-08 MED ORDER — INSULIN ASPART 100 UNIT/ML ~~LOC~~ SOLN
0.0000 [IU] | Freq: Three times a day (TID) | SUBCUTANEOUS | Status: DC
  Administered 2015-09-09: 3 [IU] via SUBCUTANEOUS
  Administered 2015-09-10: 5 [IU] via SUBCUTANEOUS
  Filled 2015-09-08: qty 3
  Filled 2015-09-08: qty 5

## 2015-09-08 MED ORDER — ATORVASTATIN CALCIUM 20 MG PO TABS
80.0000 mg | ORAL_TABLET | Freq: Every day | ORAL | Status: DC
  Administered 2015-09-09: 80 mg via ORAL
  Filled 2015-09-08: qty 4

## 2015-09-08 MED ORDER — METOPROLOL TARTRATE 25 MG PO TABS
12.5000 mg | ORAL_TABLET | Freq: Two times a day (BID) | ORAL | Status: DC
  Administered 2015-09-08 – 2015-09-10 (×4): 12.5 mg via ORAL
  Filled 2015-09-08 (×4): qty 1

## 2015-09-08 MED ORDER — LORAZEPAM 0.5 MG PO TABS: 0.5000 mg | ORAL_TABLET | Freq: Four times a day (QID) | ORAL | Status: DC | PRN

## 2015-09-08 MED ORDER — INSULIN ASPART 100 UNIT/ML ~~LOC~~ SOLN: 0.0000 [IU] | Freq: Every day | SUBCUTANEOUS | Status: DC

## 2015-09-08 MED ORDER — ACETAMINOPHEN 325 MG PO TABS
650.0000 mg | ORAL_TABLET | Freq: Four times a day (QID) | ORAL | Status: DC | PRN
  Filled 2015-09-08: qty 2

## 2015-09-08 MED ORDER — SERTRALINE HCL 25 MG PO TABS
50.0000 mg | ORAL_TABLET | Freq: Every day | ORAL | Status: DC
  Administered 2015-09-08 – 2015-09-09 (×2): 50 mg via ORAL
  Filled 2015-09-08 (×3): qty 2

## 2015-09-08 MED ORDER — ENSURE ENLIVE PO LIQD
237.0000 mL | Freq: Two times a day (BID) | ORAL | Status: DC
  Administered 2015-09-09 – 2015-09-10 (×3): 237 mL via ORAL

## 2015-09-08 MED ORDER — CLOPIDOGREL BISULFATE 75 MG PO TABS
75.0000 mg | ORAL_TABLET | Freq: Every day | ORAL | Status: DC
  Administered 2015-09-09 – 2015-09-10 (×2): 75 mg via ORAL
  Filled 2015-09-08 (×2): qty 1

## 2015-09-08 MED ORDER — SODIUM CHLORIDE 0.9 % IV SOLN
INTRAVENOUS | Status: DC
  Administered 2015-09-08: 17:00:00 via INTRAVENOUS

## 2015-09-08 MED ORDER — POLYETHYLENE GLYCOL 3350 17 G PO PACK: 17.0000 g | PACK | Freq: Every day | ORAL | Status: DC | PRN

## 2015-09-08 MED ORDER — HEPARIN SODIUM (PORCINE) 5000 UNIT/ML IJ SOLN
5000.0000 [IU] | Freq: Three times a day (TID) | INTRAMUSCULAR | Status: DC
  Administered 2015-09-08 – 2015-09-10 (×5): 5000 [IU] via SUBCUTANEOUS
  Filled 2015-09-08 (×4): qty 1

## 2015-09-08 MED ORDER — ACETAMINOPHEN 650 MG RE SUPP: 650.0000 mg | Freq: Four times a day (QID) | RECTAL | Status: DC | PRN

## 2015-09-08 MED ORDER — MAGNESIUM SULFATE 2 GM/50ML IV SOLN
2.0000 g | Freq: Once | INTRAVENOUS | Status: AC
  Administered 2015-09-08: 2 g via INTRAVENOUS
  Filled 2015-09-08: qty 50

## 2015-09-08 MED ORDER — DOCUSATE SODIUM 100 MG PO CAPS
100.0000 mg | ORAL_CAPSULE | Freq: Every day | ORAL | Status: DC
  Administered 2015-09-09 – 2015-09-10 (×2): 100 mg via ORAL
  Filled 2015-09-08 (×2): qty 1

## 2015-09-08 MED ORDER — ASPIRIN EC 81 MG PO TBEC
81.0000 mg | DELAYED_RELEASE_TABLET | Freq: Every day | ORAL | Status: DC
  Administered 2015-09-09: 81 mg via ORAL
  Filled 2015-09-08: qty 1

## 2015-09-08 MED ORDER — FAMOTIDINE 20 MG PO TABS
20.0000 mg | ORAL_TABLET | Freq: Two times a day (BID) | ORAL | Status: DC
  Administered 2015-09-08 – 2015-09-10 (×4): 20 mg via ORAL
  Filled 2015-09-08 (×4): qty 1

## 2015-09-08 MED ORDER — NICOTINE 21 MG/24HR TD PT24
21.0000 mg | MEDICATED_PATCH | Freq: Every day | TRANSDERMAL | Status: DC
  Administered 2015-09-09 – 2015-09-10 (×2): 21 mg via TRANSDERMAL
  Filled 2015-09-08 (×2): qty 1

## 2015-09-08 MED ORDER — FERROUS SULFATE 325 (65 FE) MG PO TABS
325.0000 mg | ORAL_TABLET | Freq: Three times a day (TID) | ORAL | Status: DC
  Administered 2015-09-09 – 2015-09-10 (×5): 325 mg via ORAL
  Filled 2015-09-08 (×5): qty 1

## 2015-09-08 MED ORDER — DONEPEZIL HCL 5 MG PO TABS
10.0000 mg | ORAL_TABLET | Freq: Every day | ORAL | Status: DC
  Administered 2015-09-08 – 2015-09-09 (×2): 10 mg via ORAL
  Filled 2015-09-08 (×2): qty 2

## 2015-09-08 NOTE — Progress Notes (Addendum)
Patient's Mag 1.3, Dr. Volanda Napoleon paged and made aware and to place orders.

## 2015-09-08 NOTE — ED Provider Notes (Signed)
[facility] Emergency Department Provider Note  ____________________________________________   I have reviewed the triage vital signs and the nursing notes.   HISTORY  Chief Complaint Near Syncope    HPI Jasmine Arias is a 80 y.o. female presents today after having a near syncopal event at her doctor's office. He does have a history of recent CVA with right upper 70 weakness. All other medical problems. She was noted to be unsteady on her feet when she stood up and was sent here for further evaluation. She is a very poor historian. History is per her and her husband. She denies any fever chills nausea vomiting diarrhea or chest pain. She states she feels okay at this moment.  Past Medical History  Diagnosis Date  . Hyperlipidemia   . Hypertension   . Diabetes mellitus without complication ([place])   . Memory changes   . COPD (chronic obstructive pulmonary disease) ([place])   . Anxiety   . Indigestion   . Stroke (cerebrum) [facility])     Patient Active Problem List   Diagnosis Date Noted  . CVA, old, monoplegia upper limb ([place]) 09/08/2015  . Atrial flutter ([place]) 09/08/2015  . Carotid stenosis, symptomatic, with infarction ([place]) 08/11/2015  . Mixed Alzheimer's and vascular dementia 07/10/2015  . B12 deficiency 07/10/2015  . Malnutrition ([place]) 05/11/2015  . Dementia 05/11/2015  . Type 2 diabetes mellitus with background retinopathy ([place]) 05/11/2015  . Vertigo 05/11/2015  . HLD (hyperlipidemia) 05/11/2015  . Essential hypertension 05/11/2015  . Multiple thyroid nodules 05/11/2015  . Carotid stenosis 05/11/2015  . Aortic atherosclerosis ([place]) 05/11/2015  . Polycythemia 02/12/2014  . Hyperthyroidism, subclinical 02/12/2014  . Cardiac murmur 02/11/2014  . Carotid artery bruit 02/11/2014  . Fast heart beat 02/11/2014  . Lung mass 06/10/2012  . Compulsive tobacco user syndrome 06/09/2012    Past Surgical History  Procedure Laterality Date  . Abdominal  hysterectomy      heavy bleeding  . Endarterectomy Left 08/11/2015    Procedure: ENDARTERECTOMY CAROTID;  Surgeon: Katha Cabal, MD;  Location: [facility] ORS;  Service: Vascular;  Laterality: Left;  . Peripheral vascular catheterization Left 08/11/2015    Procedure: Carotid Angiography;  Surgeon: Katha Cabal, MD;  Location: [place] CV LAB;  Service: Cardiovascular;  Laterality: Left;  . Carotid artery - subclavian artery bypass graft      Current Outpatient Rx  Name  Route  Sig  Dispense  Refill  . acetaminophen (TYLENOL) 325 MG tablet   Oral   Take 1-2 tablets (325-650 mg total) by mouth every 4 (four) hours as needed for mild pain (or temp >/= 101 F).   30 tablet   1   . alum & mag hydroxide-simeth (MAALOX/MYLANTA) 200-200-20 MG/5ML suspension   Oral   Take 15-30 mLs by mouth every 2 (two) hours as needed for indigestion.   355 mL   0   . antiseptic oral rinse (CPC / CETYLPYRIDINIUM CHLORIDE 0.05%) 0.05 % LIQD solution   Mouth Rinse   7 mLs by Mouth Rinse route 2 times daily at 12 noon and 4 pm.   200 mL   1   . aspirin EC 81 MG EC tablet   Oral   Take 1 tablet (81 mg total) by mouth daily.   30 tablet   11   . atorvastatin (LIPITOR) 80 MG tablet   Oral   Take 1 tablet (80 mg total) by mouth daily at 6 PM.   120 tablet  4   . chlorhexidine (PERIDEX) 0.12 % solution   Mouth Rinse   15 mLs by Mouth Rinse route 2 (two) times daily.   120 mL   0   . clopidogrel (PLAVIX) 75 MG tablet   Oral   Take 1 tablet (75 mg total) by mouth daily with breakfast.   30 tablet   5   . docusate sodium (COLACE) 100 MG capsule   Oral   Take 1 capsule (100 mg total) by mouth daily.   10 capsule   0   . donepezil (ARICEPT) 10 MG tablet   Oral   Take 1 tablet (10 mg total) by mouth at bedtime.   30 tablet   1   . famotidine (PEPCID) 20 MG tablet   Oral   Take 1 tablet (20 mg total) by mouth 2 (two) times daily.   60 tablet   5   . feeding supplement, ENSURE  ENLIVE, (ENSURE ENLIVE) LIQD   Oral   Take 237 mLs by mouth 2 (two) times daily between meals.   237 mL   12   . guaiFENesin-dextromethorphan (ROBITUSSIN DM) 100-10 MG/5ML syrup   Oral   Take 15 mLs by mouth every 4 (four) hours as needed for cough.   118 mL   0   . hydrochlorothiazide (HYDRODIURIL) 25 MG tablet   Oral   Take 1 tablet (25 mg total) by mouth daily.   30 tablet   3   . LORazepam (ATIVAN) 0.5 MG tablet   Oral   Take 1 tablet (0.5 mg total) by mouth every 6 (six) hours as needed for anxiety.   30 tablet   0   . metFORMIN (GLUCOPHAGE) 500 MG tablet   Oral   Take 500 mg by mouth 2 (two) times daily with a meal.          . metoprolol tartrate (LOPRESSOR) 25 MG tablet   Oral   Take 0.5 tablets (12.5 mg total) by mouth 2 (two) times daily.   60 tablet   1   . mometasone-formoterol (DULERA) 100-5 MCG/ACT AERO   Inhalation   Inhale 2 puffs into the lungs 2 (two) times daily.   1 Inhaler   5   . morphine 2 MG/ML injection   Intravenous   Inject 1-2.5 mLs (2-5 mg total) into the vein every hour as needed.   1 mL   0   . nicotine (NICODERM CQ - DOSED IN MG/24 HOURS) 21 mg/24hr patch   Transdermal   Place 1 patch (21 mg total) onto the skin daily.   28 patch   0   . Nutritional Supplements (ENSURE NUTRITION SHAKE) LIQD   Oral   Take 8 oz by mouth 2 (two) times daily.   237 mL   12   . sertraline (ZOLOFT) 50 MG tablet   Oral   Take 1 tablet (50 mg total) by mouth at bedtime.   30 tablet   1     Allergies Review of patient's allergies indicates no known allergies.  Family History  Problem Relation Age of Onset  . Hypertension Mother   . Hypertension Father   . Heart disease Sister   . Hypertension Brother     Social History Social History  Substance Use Topics  . Smoking status: Current Every Day Smoker -- 1.00 packs/day  . Smokeless tobacco: Never Used  . Alcohol Use: No    Review of Systems Constitutional: No  fever/chills Eyes: No visual  changes. ENT: No sore throat. No stiff neck no neck pain Cardiovascular: Denies chest pain. Respiratory: Denies shortness of breath. Gastrointestinal:   no vomiting.  No diarrhea.  No constipation. Genitourinary: Negative for dysuria. Musculoskeletal: Negative lower extremity swelling Skin: Negative for rash. Neurological: Negative for headaches, focal weakness or numbness. 10-point ROS otherwise negative.  ____________________________________________   PHYSICAL EXAM:  VITAL SIGNS: ED Triage Vitals  Enc Vitals Group     BP 09/08/15 1302 98/61 mmHg     Pulse Rate 09/08/15 1302 96     Resp 09/08/15 1302 18     Temp 09/08/15 1302 98.4 F (36.9 C)     Temp Source 09/08/15 1302 Oral     SpO2 09/08/15 1302 96 %     Weight 09/08/15 1302 116 lb (52.617 kg)     Height 09/08/15 1302 5\' 4"  (1.626 m)     Head Cir --      Peak Flow --      Pain Score --      Pain Loc --      Pain Edu? --      Excl. in [place]? --     Constitutional: Alert and oriented to name and place unsure of the date. Well appearing and in no acute distress. Eyes: Conjunctivae are normal. PERRL. EOMI. Head: Atraumatic. Nose: No congestion/rhinnorhea. Mouth/Throat: Mucous membranes are moist.  Oropharynx non-erythematous. Neck: No stridor.   Nontender with no meningismus Cardiovascular: Normal rate, regular rhythm. Grossly normal heart sounds.  Good peripheral circulation. Respiratory: Normal respiratory effort.  No retractions. Lungs CTAB. Abdominal: Soft and nontender. No distention. No guarding no rebound Back:  There is no focal tenderness or step off there is no midline tenderness there are no lesions noted. there is no CVA tenderness Musculoskeletal: No lower extremity tenderness. No joint effusions, no DVT signs strong distal pulses no edema Neurologic:  Normal speech and language. Right upper extremity weakness noted.  Skin:  Skin is warm, dry and intact. No rash  noted. Psychiatric: Mood and affect are normal. Speech and behavior are normal.  ____________________________________________   LABS (all labs ordered are listed, but only abnormal results are displayed)  Labs Reviewed  BASIC METABOLIC PANEL - Abnormal; Notable for the following:    Potassium 3.4 (*)    Glucose, Bld 172 (*)    All other components within normal limits  CBC - Abnormal; Notable for the following:    RBC 3.57 (*)    Hemoglobin 9.4 (*)    HCT 30.2 (*)    MCHC 31.2 (*)    RDW 16.2 (*)    All other components within normal limits  URINALYSIS COMPLETEWITH MICROSCOPIC ([facility] ONLY) - Abnormal; Notable for the following:    Color, Urine YELLOW (*)    APPearance CLEAR (*)    Ketones, ur TRACE (*)    Protein, ur 30 (*)    Squamous Epithelial / LPF 0-5 (*)    All other components within normal limits  TROPONIN I   ____________________________________________  EKG  I personally interpreted any EKGs ordered by me or triage Normal sinus rhythm with trigeminy, no acute ST elevation or depression baseline rhythm and EKG is unchanged from prior. Rate 88 bpm, ____________________________________________  RADIOLOGY  I reviewed any imaging ordered by me or triage that were performed during my shift ____________________________________________   PROCEDURES  Procedure(s) performed: None  Critical Care performed: None  ____________________________________________   INITIAL IMPRESSION / ASSESSMENT AND PLAN / ED COURSE  Pertinent labs &  imaging results that were available during my care of the patient were reviewed by me and considered in my medical decision making (see chart for details).  Patient with a presyncopal event in trigeminy here in and out of trigeminy in fact, may have been an a flutter at the outpatient facility, given her age, nursing syncopal event, an irregular EKG, I think patient would benefit from  observation. ____________________________________________   FINAL CLINICAL IMPRESSION(S) / ED DIAGNOSES  Final diagnoses:  None     Schuyler Amor, MD 09/08/15 [phone]

## 2015-09-08 NOTE — H&P (Signed)
[facility] NAME: Jasmine Arias    MR#:  [id number]  DATE OF BIRTH:  [birth year]  DATE OF ADMISSION:  09/08/2015  PRIMARY CARE PHYSICIAN: Park Liter, DO   REQUESTING/REFERRING PHYSICIAN: Dr. Burlene Arnt  CHIEF COMPLAINT:   Chief Complaint  Patient presents with  . Near Syncope    HISTORY OF PRESENT ILLNESS:  Jasmine Arias  is a 80 y.o. female with a known history of dementia, recent CVA in 08/2015 with residual right arm weakness status post left-sided endarterectomy at that time, COPD, diabetes, hypertension presents from primary care office with near syncope. After her discharge in 08/2015 she was sent to rehabilitation at [facility]. She returned home on Wednesday. She was at her primary care office for posthospitalization visit to straighten out her medications when she became acutely weak and unsteady upon standing. She required support and had to lay down. Her PCP called EMS and on presentation to the emergency room she is found to have EKG changes suggestive of possible atrial flutter versus episodic trigeminy and multiple PVCs. She is being admitted for observation. It turns out that after her discharge from [facility] care on 12/6 she did not pick up her prescriptions from the pharmacy so has not had any medications or 2 days.  PAST MEDICAL HISTORY:   Past Medical History  Diagnosis Date  . Hyperlipidemia   . Hypertension   . Diabetes mellitus without complication ([place])   . Memory changes   . COPD (chronic obstructive pulmonary disease) ([place])   . Anxiety   . Indigestion   . Stroke (cerebrum) ([place])   . Dementia   . Anemia     PAST SURGICAL HISTORY:   Past Surgical History  Procedure Laterality Date  . Abdominal hysterectomy      heavy bleeding  . Endarterectomy Left 08/11/2015    Procedure: ENDARTERECTOMY CAROTID;  Surgeon: Katha Cabal, MD;  Location: [facility] ORS;  Service: Vascular;   Laterality: Left;  . Peripheral vascular catheterization Left 08/11/2015    Procedure: Carotid Angiography;  Surgeon: Katha Cabal, MD;  Location: [facility];  Service: Cardiovascular;  Laterality: Left;  . Carotid artery - subclavian artery bypass graft      SOCIAL HISTORY:   Social History  Substance Use Topics  . Smoking status: Current Every Day Smoker -- 1.00 packs/day  . Smokeless tobacco: Never Used  . Alcohol Use: No    FAMILY HISTORY:   Family History  Problem Relation Age of Onset  . Hypertension Mother   . Hypertension Father   . Heart disease Sister   . Hypertension Brother     DRUG ALLERGIES:  No Known Allergies  REVIEW OF SYSTEMS:   Review of Systems  Constitutional: Negative for fever, chills, weight loss and malaise/fatigue.  HENT: Negative for congestion and hearing loss.   Eyes: Negative for blurred vision and pain.  Respiratory: Negative for cough, hemoptysis, sputum production, shortness of breath and stridor.   Cardiovascular: Negative for chest pain, palpitations, orthopnea and leg swelling.  Gastrointestinal: Negative for nausea, vomiting, abdominal pain, diarrhea, constipation and blood in stool.  Genitourinary: Negative for dysuria and frequency.  Musculoskeletal: Negative for myalgias, back pain, joint pain and neck pain.  Skin: Negative for rash.  Neurological: Positive for dizziness and focal weakness. Negative for loss of consciousness and headaches.  Endo/Heme/Allergies: Does not bruise/bleed easily.  Psychiatric/Behavioral: Positive for memory loss. Negative for depression and hallucinations. The patient  is not nervous/anxious.     MEDICATIONS AT HOME:   Prior to Admission medications   Medication Sig Start Date End Date Taking? Authorizing Provider  acetaminophen (TYLENOL) 325 MG tablet Take 1-2 tablets (325-650 mg total) by mouth every 4 (four) hours as needed for mild pain (or temp >/= 101 F). 08/16/15   Katha Cabal, MD  alum & mag hydroxide-simeth (MAALOX/MYLANTA) 200-200-20 MG/5ML suspension Take 15-30 mLs by mouth every 2 (two) hours as needed for indigestion. 08/16/15   Katha Cabal, MD  antiseptic oral rinse (CPC / CETYLPYRIDINIUM CHLORIDE 0.05%) 0.05 % LIQD solution 7 mLs by Mouth Rinse route 2 times daily at 12 noon and 4 pm. 08/16/15   Katha Cabal, MD  aspirin EC 81 MG EC tablet Take 1 tablet (81 mg total) by mouth daily. 08/16/15   Katha Cabal, MD  atorvastatin (LIPITOR) 80 MG tablet Take 1 tablet (80 mg total) by mouth daily at 6 PM. 08/16/15   Katha Cabal, MD  chlorhexidine (PERIDEX) 0.12 % solution 15 mLs by Mouth Rinse route 2 (two) times daily. 08/16/15   Katha Cabal, MD  clopidogrel (PLAVIX) 75 MG tablet Take 1 tablet (75 mg total) by mouth daily with breakfast. 08/16/15   Katha Cabal, MD  docusate sodium (COLACE) 100 MG capsule Take 1 capsule (100 mg total) by mouth daily. 08/16/15   Katha Cabal, MD  donepezil (ARICEPT) 10 MG tablet Take 1 tablet (10 mg total) by mouth at bedtime. 08/16/15   Katha Cabal, MD  famotidine (PEPCID) 20 MG tablet Take 1 tablet (20 mg total) by mouth 2 (two) times daily. 08/16/15   Katha Cabal, MD  feeding supplement, ENSURE ENLIVE, (ENSURE ENLIVE) LIQD Take 237 mLs by mouth 2 (two) times daily between meals. 08/16/15   Katha Cabal, MD  guaiFENesin-dextromethorphan (ROBITUSSIN DM) 100-10 MG/5ML syrup Take 15 mLs by mouth every 4 (four) hours as needed for cough. 08/16/15   Katha Cabal, MD  hydrochlorothiazide (HYDRODIURIL) 25 MG tablet Take 1 tablet (25 mg total) by mouth daily. 06/12/15   Megan P Johnson, DO  LORazepam (ATIVAN) 0.5 MG tablet Take 1 tablet (0.5 mg total) by mouth every 6 (six) hours as needed for anxiety. 08/16/15   Katha Cabal, MD  metFORMIN (GLUCOPHAGE) 500 MG tablet Take 500 mg by mouth 2 (two) times daily with a meal.  02/08/15   Historical Provider, MD  metoprolol tartrate  (LOPRESSOR) 25 MG tablet Take 0.5 tablets (12.5 mg total) by mouth 2 (two) times daily. 08/16/15   Katha Cabal, MD  mometasone-formoterol (DULERA) 100-5 MCG/ACT AERO Inhale 2 puffs into the lungs 2 (two) times daily. 08/16/15   Katha Cabal, MD  morphine 2 MG/ML injection Inject 1-2.5 mLs (2-5 mg total) into the vein every hour as needed. 08/16/15   Katha Cabal, MD  nicotine (NICODERM CQ - DOSED IN MG/24 HOURS) 21 mg/24hr patch Place 1 patch (21 mg total) onto the skin daily. 08/16/15   Katha Cabal, MD  Nutritional Supplements (ENSURE NUTRITION SHAKE) LIQD Take 8 oz by mouth 2 (two) times daily. 05/11/15   Megan P Johnson, DO  sertraline (ZOLOFT) 50 MG tablet Take 1 tablet (50 mg total) by mouth at bedtime. 08/16/15   Katha Cabal, MD      VITAL SIGNS:  Blood pressure 122/70, pulse 89, temperature 98.4 F (36.9 C), temperature source Oral, resp. rate 18, height 5\' 4"  (1.626  m), weight 52.617 kg (116 lb), SpO2 99 %.  PHYSICAL EXAMINATION:  GENERAL:  80 y.o.-year-old patient lying in the bed with no acute distress.  EYES: Pupils equal, round, reactive to light and accommodation. No scleral icterus. Extraocular muscles intact.  HEENT: Head atraumatic, normocephalic. Oropharynx and nasopharynx clear. Good dentition, moist mucous membranes NECK:  Supple, no jugular venous distention. No thyroid enlargement, no tenderness. Left carotid endarterectomy scar is healing well LUNGS: Normal breath sounds bilaterally, no wheezing, rales,rhonchi or crepitation. No use of accessory muscles of respiration.  CARDIOVASCULAR: Irregular, S1, S2 normal. No murmurs, rubs, or gallops.  ABDOMEN: Soft, nontender, nondistended. Bowel sounds present. No organomegaly or mass. No guarding no rebound EXTREMITIES: No pedal edema, cyanosis, or clubbing. Pulses 1+ NEUROLOGIC: Cranial nerves II through XII are intact. Muscle strength 5/5 in all extremities with the exception of the right arm in which  strength is 4 out of 5 and fine motor is decreased.. Sensation intact. Gait not checked.  PSYCHIATRIC: The patient is alert and oriented to person only SKIN: No obvious rash, lesion, or ulcer. Healing scar from left-sided carotid endarterectomy  LABORATORY PANEL:   CBC  Recent Labs Lab 09/08/15 1312  WBC 7.6  HGB 9.4*  HCT 30.2*  PLT 287   ------------------------------------------------------------------------------------------------------------------  Chemistries   Recent Labs Lab 09/08/15 1312  NA 141  K 3.4*  CL 103  CO2 31  GLUCOSE 172*  BUN 15  CREATININE 0.88  CALCIUM 9.3   ------------------------------------------------------------------------------------------------------------------  Cardiac Enzymes  Recent Labs Lab 09/08/15 1312  TROPONINI <0.03   ------------------------------------------------------------------------------------------------------------------  RADIOLOGY:  Dg Chest 2 View  09/08/2015  CLINICAL DATA:  80 year old who was recently in a rehabilitation facility after a stroke, recent left carotid endarterectomy 08/11/2015, presenting with recent generalized weakness and an acute near syncopal episode while at the physician's office earlier today. EXAM: CHEST  2 VIEW COMPARISON:  06/12/2015 and earlier. FINDINGS: Cardiac silhouette mildly enlarged, unchanged. Thoracic aorta atherosclerotic, unchanged. Hilar and mediastinal contours otherwise unremarkable. Hyperinflation and emphysematous changes throughout both lungs, unchanged. Mildly prominent bronchovascular markings diffusely and mild central peribronchial thickening, unchanged. Lungs otherwise clear. No localized airspace consolidation. No pleural effusions. No pneumothorax. Normal pulmonary vascularity. Degenerative changes throughout the thoracic spine. Proximal left common carotid artery stent. Atherosclerotic calcification of the great vessels projects over the lung apices. IMPRESSION:  COPD/emphysema. No acute cardiopulmonary disease. Stable mild cardiomegaly. Electronically Signed   By: Evangeline Dakin M.D.   On: 09/08/2015 14:12    EKG:   Orders placed or performed during the hospital encounter of 09/08/15  . ED EKG  . ED EKG    IMPRESSION AND PLAN:   1. Syncope: - Check orthostatics, provide gentle hydration - Monitor on telemetry as she seems to be having some EKG changes, though none that would cause syncope - Cycle cardiac enzymes - Check echo, I do not see an echo after 12/2014  2. Trigeminy with multiple PVCs - Not likely pathogenic but clearly visible on her admission EKG and new - correct electrolytes - Continue beta blocker - Consult cardiology, Dr. Wanda Plump ATH is her cardiologist  3. Hypokalemia: - Replace, check magnesium  4. Anemia - Stable from prior values, check iron panel  5. Recent CVA in left MCA territory with residual right arm weakness - Continue aspirin, Plavix, statin - Continue physical therapy  6. Hypertension - Currently normotensive. Continue home medications including metoprolol, hydrochlorothiazide  7. Diabetes mellitus type 2 - Hemoglobin A1c, start sliding scale  8. Prophylaxis:  Heparin for DVT prophylaxis. Protonix for GI prophylaxis as she is on Pepcid home  9. Dementia: No behavioral disturbance this time   All the records are reviewed and case discussed with ED provider. Management plans discussed with the patient, family and they are in agreement.  CODE STATUS: Full  TOTAL TIME TAKING CARE OF THIS PATIENT: 45 minutes.  Greater than 50% of time spent in coordination of care and counseling.  Myrtis Ser M.D on 09/08/2015 at 4:09 PM  Between 7am to 6pm - Pager - [phone]  After 6pm go to [email or web address] - password [email or web address] [facility]  Office  [phone]  CC: Primary care physician; Park Liter, DO

## 2015-09-08 NOTE — Progress Notes (Signed)
*  PRELIMINARY RESULTS* Echocardiogram 2D Echocardiogram has been performed.  Jasmine Arias 09/08/2015, 6:03 PM

## 2015-09-08 NOTE — Assessment & Plan Note (Signed)
New onset on EKG today, symptomatic with mild dizziness. Appointment made with Dr. Ubaldo Glassing for 2PM at [facility]. Patient agreeable to go. However, when she stood up to check out, she became much more "woozy" and thought she was going to pass out. Her sugar was checked and it was 161. EMS called and patient to go to [facility] for symptomatic A. Flutter.

## 2015-09-08 NOTE — Progress Notes (Signed)
BP 135/82 mmHg  Pulse 101  Temp(Src) 97.4 F (36.3 C)  Ht 5\' 1"  (1.549 m)  Wt 112 lb 1.6 oz (50.848 kg)  BMI 21.19 kg/m2  SpO2 97%  LMP  (LMP Unknown)   Subjective:    Patient ID: Jasmine Arias, female    DOB: [birth year], 80 y.o.   MRN: [id number]  HPI: Jasmine Arias is a 80 y.o. female  Chief Complaint  Patient presents with  . Hospitalization Follow-up    Had surgery on carotid artery.  . Cerebrovascular Accident    During, surgery-had stroke.  . Arm Problem    Patient can't use right arm.   . Medication Problem    Husband states he doesn't know what she's supposed to be taking. He said what he brought is all she's been taking but he feels like theres more. He says she doesn't know or doesn't remember.    Worth had carotid stenting after a syncopal event 08/02/15. Had R arm weakness and had CT angiogram which showed a dissection of proximal L common carotid, so had an other endovascular procedure and stent was placed. MRI showed multifocal area of acute non-hemorrhagic L hemisphere MCA territory infarcts. Diagnosed with dysphagia 2 for think liquids. Went to SNF until Wednesday. Doesn't know what she's supposed to be taking. States that the insurance ran out and she is not doing well. Not able to get dressed on her own. Having trouble lifting her arm. Her husband is not sure if she had PT in SNF, and doesn't feel like it helped much. He notes that she is a bit better, but still not great. She notes today that she is very dizzy. She attributes this to the fact that she didn't eat breakfast today. She otherwise has no other complaints at this time.   Saw vascular on Monday, going to see her in 6 months   Has not seen Dr. Manuella Ghazi since September, talked to him in October for MRI- due to see him again in March  Relevant past medical, surgical, family and social history reviewed and updated as indicated. Interim medical history since our last visit  reviewed. Allergies and medications reviewed and updated.  Review of Systems  Constitutional: Positive for unexpected weight change. Negative for fever, chills, diaphoresis, activity change, appetite change and fatigue.  Respiratory: Negative.   Cardiovascular: Negative.   Gastrointestinal: Negative.   Skin: Negative.   Neurological: Positive for dizziness and weakness. Negative for tremors, seizures, syncope, speech difficulty and numbness.  Hematological: Negative.   Psychiatric/Behavioral: Negative.     Per HPI unless specifically indicated above     Objective:    BP 135/82 mmHg  Pulse 101  Temp(Src) 97.4 F (36.3 C)  Ht 5\' 1"  (1.549 m)  Wt 112 lb 1.6 oz (50.848 kg)  BMI 21.19 kg/m2  SpO2 97%  LMP  (LMP Unknown)  Wt Readings from Last 3 Encounters:  09/08/15 116 lb (52.617 kg)  09/08/15 112 lb 1.6 oz (50.848 kg)  08/12/15 127 lb 10.3 oz (57.9 kg)    Physical Exam  Constitutional: She is oriented to person, place, and time. She appears well-developed and well-nourished. No distress.  HENT:  Head: Normocephalic and atraumatic.  Right Ear: Hearing normal.  Left Ear: Hearing normal.  Nose: Nose normal.  Eyes: Conjunctivae and lids are normal. Right eye exhibits no discharge. Left eye exhibits no discharge. No scleral icterus.  Neck:  Large well healing scar on the L side of her  neck  Cardiovascular: Normal rate, intact distal pulses and normal pulses.  An irregularly irregular rhythm present. Exam reveals no gallop and no friction rub.   Pulmonary/Chest: Effort normal. No respiratory distress. She has no wheezes. She has rales. She exhibits no tenderness.  Abdominal: Soft. Bowel sounds are normal. She exhibits no distension and no mass. There is no tenderness. There is no rebound and no guarding.  Musculoskeletal: Normal range of motion.  Neurological: She is alert and oriented to person, place, and time.  Strength good for dermatomes, but decreased ROM to R arm and  weakness  Skin: Skin is warm, dry and intact. No rash noted. No erythema. No pallor.  Psychiatric: She has a normal mood and affect. Her speech is normal and behavior is normal. Judgment and thought content normal. Cognition and memory are normal.  Nursing note and vitals reviewed.   Results for orders placed or performed during the hospital encounter of 08/11/15  MRSA PCR Screening  Result Value Ref Range   MRSA by PCR NEGATIVE NEGATIVE  Glucose, capillary  Result Value Ref Range   Glucose-Capillary 131 (H) 65 - 99 mg/dL  Glucose, capillary  Result Value Ref Range   Glucose-Capillary 166 (H) 65 - 99 mg/dL  CBC  Result Value Ref Range   WBC 13.5 (H) 3.6 - 11.0 K/uL   RBC 2.96 (L) 3.80 - 5.20 MIL/uL   Hemoglobin 8.8 (L) 12.0 - 16.0 g/dL   HCT 26.6 (L) 35.0 - 47.0 %   MCV 89.9 80.0 - 100.0 fL   MCH 29.7 26.0 - 34.0 pg   MCHC 33.0 32.0 - 36.0 g/dL   RDW 13.9 11.5 - 14.5 %   Platelets 202 150 - 440 K/uL  Basic metabolic panel  Result Value Ref Range   Sodium 140 135 - 145 mmol/L   Potassium 3.6 3.5 - 5.1 mmol/L   Chloride 109 101 - 111 mmol/L   CO2 24 22 - 32 mmol/L   Glucose, Bld 188 (H) 65 - 99 mg/dL   BUN 14 6 - 20 mg/dL   Creatinine, Ser 0.82 0.44 - 1.00 mg/dL   Calcium 7.5 (L) 8.9 - 10.3 mg/dL   GFR calc non Af Amer >60 >60 mL/min   GFR calc Af Amer >60 >60 mL/min   Anion gap 7 5 - 15  CBC  Result Value Ref Range   WBC 13.3 (H) 3.6 - 11.0 K/uL   RBC 3.30 (L) 3.80 - 5.20 MIL/uL   Hemoglobin 9.7 (L) 12.0 - 16.0 g/dL   HCT 29.3 (L) 35.0 - 47.0 %   MCV 88.8 80.0 - 100.0 fL   MCH 29.3 26.0 - 34.0 pg   MCHC 32.9 32.0 - 36.0 g/dL   RDW 14.0 11.5 - 14.5 %   Platelets 219 150 - 440 K/uL  Comprehensive metabolic panel  Result Value Ref Range   Sodium 139 135 - 145 mmol/L   Potassium 3.7 3.5 - 5.1 mmol/L   Chloride 109 101 - 111 mmol/L   CO2 25 22 - 32 mmol/L   Glucose, Bld 175 (H) 65 - 99 mg/dL   BUN 14 6 - 20 mg/dL   Creatinine, Ser 0.82 0.44 - 1.00 mg/dL   Calcium  7.6 (L) 8.9 - 10.3 mg/dL   Total Protein 5.5 (L) 6.5 - 8.1 g/dL   Albumin 2.7 (L) 3.5 - 5.0 g/dL   AST 24 15 - 41 U/L   ALT 19 14 - 54 U/L   Alkaline Phosphatase  43 38 - 126 U/L   Total Bilirubin <0.1 (L) 0.3 - 1.2 mg/dL   GFR calc non Af Amer >60 >60 mL/min   GFR calc Af Amer >60 >60 mL/min   Anion gap 5 5 - 15  Protime-INR  Result Value Ref Range   Prothrombin Time 15.6 (H) 11.4 - 15.0 seconds   INR 1.22   Lipid panel  Result Value Ref Range   Cholesterol 143 0 - 200 mg/dL   Triglycerides 72 <150 mg/dL   HDL 41 >40 mg/dL   Total CHOL/HDL Ratio 3.5 RATIO   VLDL 14 0 - 40 mg/dL   LDL Cholesterol 88 0 - 99 mg/dL  Troponin I  Result Value Ref Range   Troponin I <0.03 <0.031 ng/mL  Troponin I  Result Value Ref Range   Troponin I <0.03 <0.031 ng/mL  Troponin I  Result Value Ref Range   Troponin I <0.03 <0.031 ng/mL  Hemoglobin  Result Value Ref Range   Hemoglobin 9.1 (L) 12.0 - 16.0 g/dL  Glucose, capillary  Result Value Ref Range   Glucose-Capillary 185 (H) 65 - 99 mg/dL   Comment 1 Notify RN   Magnesium  Result Value Ref Range   Magnesium 1.2 (L) 1.7 - 2.4 mg/dL  Glucose, capillary  Result Value Ref Range   Glucose-Capillary 125 (H) 65 - 99 mg/dL  CBC  Result Value Ref Range   WBC 7.5 3.6 - 11.0 K/uL   RBC 2.51 (L) 3.80 - 5.20 MIL/uL   Hemoglobin 7.3 (L) 12.0 - 16.0 g/dL   HCT 22.2 (L) 35.0 - 47.0 %   MCV 88.5 80.0 - 100.0 fL   MCH 29.0 26.0 - 34.0 pg   MCHC 32.7 32.0 - 36.0 g/dL   RDW 14.2 11.5 - 14.5 %   Platelets 166 150 - 440 K/uL  Basic metabolic panel  Result Value Ref Range   Sodium 142 135 - 145 mmol/L   Potassium 3.2 (L) 3.5 - 5.1 mmol/L   Chloride 111 101 - 111 mmol/L   CO2 28 22 - 32 mmol/L   Glucose, Bld 148 (H) 65 - 99 mg/dL   BUN 11 6 - 20 mg/dL   Creatinine, Ser 0.74 0.44 - 1.00 mg/dL   Calcium 7.4 (L) 8.9 - 10.3 mg/dL   GFR calc non Af Amer >60 >60 mL/min   GFR calc Af Amer >60 >60 mL/min   Anion gap 3 (L) 5 - 15  Magnesium  Result  Value Ref Range   Magnesium 2.6 (H) 1.7 - 2.4 mg/dL  Hemoglobin  Result Value Ref Range   Hemoglobin 7.4 (L) 12.0 - 16.0 g/dL  Surgical pathology  Result Value Ref Range   SURGICAL PATHOLOGY      Surgical Pathology CASE: [id number] PATIENT: [name] Surgical Pathology Report     SPECIMEN SUBMITTED: A. Left carotid plaque  CLINICAL HISTORY: None provided  PRE-OPERATIVE DIAGNOSIS: Left carotid artery stenosis  POST-OPERATIVE DIAGNOSIS: Left carotid artery stenosis     DIAGNOSIS: A. PLAQUE, LEFT CAROTID ARTERY; ENDARTERECTOMY: - CALCIFIED ATHEROMATOUS PLAQUE.   GROSS DESCRIPTION:  A. Labeled: left carotid plaque  Tissue fragment(s): 4  Size: aggregate, 6.2 x 0.8 x 0.6 cm  Description: yellow to pink focally calcified fragments  Representative submitted in one cassette(s) following decalcification.    Final Diagnosis performed by Delorse Lek, MD.  Electronically signed 08/14/2015 2:03:25PM    The electronic signature indicates that the named Attending Pathologist has evaluated the specimen  Technical component performed  at [facility], [address] [place], [place] [address] Lab: [phone] Dir: Darrick Penna. Evette Doffing, MD  Profe ssional component performed at [facility], [facility], [place], [place], [place] [address] Lab: [phone] Dir: Dellia Nims. Rubinas, MD        Assessment & Plan:   Problem List Items Addressed This Visit      Cardiovascular and Mediastinum   Carotid stenosis, symptomatic, with infarction [facility])    Unclear what medications she is taking at this time. Will obtain them from SNF. Will adjust as needed. Continue to follow with vascular as needed. Will obtain those records.       Atrial flutter ([place]) - Primary    New onset on EKG today, symptomatic with mild dizziness. Appointment made with Dr. Ubaldo Glassing for 2PM at [facility]. Patient agreeable to go. However, when she stood up to check out, she became much more  "woozy" and thought she was going to pass out. Her sugar was checked and it was 161. EMS called and patient to go to [facility] for symptomatic A. Flutter.       Relevant Orders   Ambulatory referral to Cardiology     Nervous and Auditory   CVA, old, monoplegia upper limb ([place])    Still needs PT. Will get her home health for help managing medications and for PT. Will get her back into neurology given her change in status.        Other Visit Diagnoses    Irregular heartbeat        Has never had a history of abnormal heart beat before. Will obtain EKG. EKG shows a. flutter.     Relevant Orders    EKG 12-Lead (Completed)    Chest rales        Were going to obtain CXR, however, patient became very dizzy and thought she was going to pass out- patient sent to ER for eval of a flutter.     Dizziness        She does have DM, but FSBS checked and was 161, likely due to her a. flutter. Patient to go to ER for eval. EMS called.     Relevant Orders    Glucose Hemocue Waived        Follow up plan: Return After ER/Hospitalization.

## 2015-09-08 NOTE — Assessment & Plan Note (Signed)
Still needs PT. Will get her home health for help managing medications and for PT. Will get her back into neurology given her change in status.

## 2015-09-08 NOTE — Progress Notes (Signed)
Pt. admitted to unit, [id number]. Report received from [facility], ED RN. Oriented to room, call bell, Ascom phones and staff. Bed in low position. Fall safety plan reviewed, contract signed and placed on wall, yellow non-skid socks in place, bed alarm on. Full assessment to Epic, skin assessed with Particia Nearing RN, scab to left neck. Tele box verified with tele clerk, [phone]. Will continue to monitor.

## 2015-09-08 NOTE — ED Notes (Signed)
Pt comes into the ED via EMS from Dr. Marcy Salvo office with near syncope while there for a follow up.. Pt was recently released from rehab after having a stroke, recent carotid surgery for blockage.the patient is a/ox4 on arrival..

## 2015-09-08 NOTE — Assessment & Plan Note (Signed)
Unclear what medications she is taking at this time. Will obtain them from SNF. Will adjust as needed. Continue to follow with vascular as needed. Will obtain those records.

## 2015-09-09 DIAGNOSIS — R55 Syncope and collapse: Secondary | ICD-10-CM | POA: Diagnosis not present

## 2015-09-09 DIAGNOSIS — E876 Hypokalemia: Secondary | ICD-10-CM | POA: Diagnosis not present

## 2015-09-09 DIAGNOSIS — D500 Iron deficiency anemia secondary to blood loss (chronic): Secondary | ICD-10-CM | POA: Diagnosis not present

## 2015-09-09 DIAGNOSIS — R008 Other abnormalities of heart beat: Secondary | ICD-10-CM | POA: Diagnosis not present

## 2015-09-09 LAB — TROPONIN I: Troponin I: <0.03 ng/mL (ref <0.031)

## 2015-09-09 LAB — VITAMIN B12: VITAMIN B 12: 247 pg/mL (ref 180–914)

## 2015-09-09 LAB — CBC
HCT: 25.1 % — ABNORMAL LOW (ref 35.0–47.0)
HEMOGLOBIN: 8.1 g/dL — AB (ref 12.0–16.0)
MCH: 27 pg (ref 26.0–34.0)
MCHC: 32.1 g/dL (ref 32.0–36.0)
MCV: 84 fL (ref 80.0–100.0)
PLATELETS: 228 10*3/uL (ref 150–440)
RBC: 2.99 MIL/uL — AB (ref 3.80–5.20)
RDW: 16.2 % — ABNORMAL HIGH (ref 11.5–14.5)
WBC: 5.3 10*3/uL (ref 3.6–11.0)

## 2015-09-09 LAB — MAGNESIUM: MAGNESIUM: 1.8 mg/dL (ref 1.7–2.4)

## 2015-09-09 LAB — GLUCOSE, CAPILLARY
GLUCOSE-CAPILLARY: 124 mg/dL — AB (ref 65–99)
GLUCOSE-CAPILLARY: 137 mg/dL — AB (ref 65–99)
Glucose-Capillary: 249 mg/dL — ABNORMAL HIGH (ref 65–99)

## 2015-09-09 LAB — BASIC METABOLIC PANEL
ANION GAP: 4 — AB (ref 5–15)
BUN: 13 mg/dL (ref 6–20)
CO2: 29 mmol/L (ref 22–32)
Calcium: 8.5 mg/dL — ABNORMAL LOW (ref 8.9–10.3)
Chloride: 110 mmol/L (ref 101–111)
Creatinine, Ser: 0.83 mg/dL (ref 0.44–1.00)
Glucose, Bld: 106 mg/dL — ABNORMAL HIGH (ref 65–99)
POTASSIUM: 3.5 mmol/L (ref 3.5–5.1)
SODIUM: 143 mmol/L (ref 135–145)

## 2015-09-09 LAB — HEMOGLOBIN A1C: HEMOGLOBIN A1C: 6.1 % — AB (ref 4.0–6.0)

## 2015-09-09 MED ORDER — IPRATROPIUM-ALBUTEROL 0.5-2.5 (3) MG/3ML IN SOLN
3.0000 mL | Freq: Four times a day (QID) | RESPIRATORY_TRACT | Status: DC
  Administered 2015-09-09 – 2015-09-10 (×5): 3 mL via RESPIRATORY_TRACT
  Filled 2015-09-09 (×5): qty 3

## 2015-09-09 MED ORDER — BUDESONIDE 0.25 MG/2ML IN SUSP
0.2500 mg | Freq: Two times a day (BID) | RESPIRATORY_TRACT | Status: DC
  Administered 2015-09-09 – 2015-09-10 (×3): 0.25 mg via RESPIRATORY_TRACT
  Filled 2015-09-09 (×3): qty 2

## 2015-09-09 MED ORDER — MAGNESIUM SULFATE 2 GM/50ML IV SOLN
2.0000 g | Freq: Once | INTRAVENOUS | Status: AC
  Administered 2015-09-09: 2 g via INTRAVENOUS
  Filled 2015-09-09: qty 50

## 2015-09-09 MED ORDER — IRON SUCROSE 20 MG/ML IV SOLN
300.0000 mg | Freq: Once | INTRAVENOUS | Status: AC
  Administered 2015-09-09: 300 mg via INTRAVENOUS
  Filled 2015-09-09: qty 15

## 2015-09-09 MED ORDER — POTASSIUM CHLORIDE CRYS ER 20 MEQ PO TBCR
20.0000 meq | EXTENDED_RELEASE_TABLET | Freq: Every day | ORAL | Status: DC
Start: 2015-09-09 — End: 2015-09-10
  Administered 2015-09-09: 20 meq via ORAL
  Filled 2015-09-09 (×2): qty 1

## 2015-09-09 NOTE — Care Management Obs Status (Signed)
[place] NOTIFICATION   Patient Details  Name: Jasmine Arias MRN: [id number] Date of Birth: Mar 15, 1935   Medicare Observation Status Notification Given:   (patient is confused no family present,copy left in room,copy to HIM scanning)    Ival Bible, RN 09/09/2015, 7:44 PM

## 2015-09-09 NOTE — Progress Notes (Signed)
Patient alert and oriented x3, no complaints at this time. vss at this time. Patient NSR on telemetry. Will continue to assess. Jasmine Arias R [place]  

## 2015-09-09 NOTE — Evaluation (Signed)
Physical Therapy Evaluation Patient Details Name: Jasmine Arias MRN: [id number] DOB: Nov 13, 1934 Today's Date: 09/09/2015   History of Present Illness  Pateint has hx of CVA 08/20/15 with dementia and DC to [facility]. She presents to hospital with EKG changes and atrial flutter  secondary to not taking her medicine after DC from SNF.  Clinical Impression  Patient is 80 yr old female who presents with weakness in BLE and decreased mobility with CGA for transfers sit to stand with RW and CGA for ambulation using RW 300 feet. She was not tested on steps for getting in/out of her home. She did have fatigue after ambulation and was able to ambulate short distances without AD but did have uneven path during gait. She will benefit from gait training on level surfaces and steps.     Follow Up Recommendations Home health PT    Equipment Recommendations  Rolling walker with 5" wheels    Recommendations for Other Services       Precautions / Restrictions        Mobility  Bed Mobility Overal bed mobility: Independent                Transfers Overall transfer level: Modified independent Equipment used: Rolling walker (2 wheeled)                Ambulation/Gait Ambulation/Gait assistance: Modified independent (Device/Increase time) Ambulation Distance (Feet): 300 Feet Assistive device: Rolling walker (2 wheeled) Gait Pattern/deviations:  (unsteady path with deviation to left)        Stairs            Wheelchair Mobility    Modified Rankin (Stroke Patients Only)       Balance Overall balance assessment: Modified Independent                                           Pertinent Vitals/Pain      Home Living Family/patient expects to be discharged to:: Private residence Living Arrangements: Spouse/significant other Available Help at Discharge: Family Type of Home: House Home Access: Stairs to enter Entrance  Stairs-Rails: None Technical brewer of Steps: 2 Home Layout: One level Home Equipment: None      Prior Function Level of Independence: Independent               Hand Dominance        Extremity/Trunk Assessment               Lower Extremity Assessment: Overall WFL for tasks assessed;Generalized weakness      Cervical / Trunk Assessment: Normal  Communication   Communication: No difficulties  Cognition Arousal/Alertness: Awake/alert Behavior During Therapy: WFL for tasks assessed/performed Overall Cognitive Status: Within Functional Limits for tasks assessed                      General Comments      Exercises        Assessment/Plan    PT Assessment Patient needs continued PT services  PT Diagnosis Difficulty walking   PT Problem List Decreased strength;Decreased safety awareness;Decreased mobility  PT Treatment Interventions Gait training;Therapeutic activities;Therapeutic exercise   PT Goals (Current goals can be found in the Care Plan section) Acute Rehab PT Goals Patient Stated Goal: to go home PT Goal Formulation: With patient Time For Goal Achievement: 09/23/15 Potential to Achieve Goals: Good  Frequency Min 2X/week   Barriers to discharge        Co-evaluation               End of Session   Activity Tolerance: Patient tolerated treatment well Patient left: in bed      Functional Assessment Tool Used: clinical judgement Functional Limitation: Mobility: Walking and moving around Mobility: Walking and Moving Around Current Status [id number]): At least 1 percent but less than 20 percent impaired, limited or restricted Mobility: Walking and Moving Around Goal Status [phone]): 0 percent impaired, limited or restricted    Time: 1300-1330 PT Time Calculation (min) (ACUTE ONLY): 30 min   Charges:   PT Evaluation $Initial PT Evaluation Tier I: 1 Procedure PT Treatments $Gait Training: 8-22 mins   PT G Codes:   PT  G-Codes **NOT FOR INPATIENT CLASS** Functional Assessment Tool Used: clinical judgement Functional Limitation: Mobility: Walking and moving around Mobility: Walking and Moving Around Current Status [id number]): At least 1 percent but less than 20 percent impaired, limited or restricted Mobility: Walking and Moving Around Goal Status [phone]): 0 percent impaired, limited or restricted  Alanson Puls, PT, DPT  Arelia Sneddon S 09/09/2015, 2:31 PM

## 2015-09-09 NOTE — Consult Note (Signed)
[facility] Cardiology  CARDIOLOGY CONSULT NOTE  Patient ID: Jasmine Arias MRN: [id number] DOB/AGE: 78-Dec-1936 78 y.o.  Admit date: 09/08/2015 Referring Physician Volanda Napoleon Primary Physician Park Liter Primary Cardiologist Fath Reason for Consultation syncope  HPI: 80 year old female referred for evaluation of syncope and PVCs. Patient has baseline dementia. He recently suffered CVA 08/2015 underwent left sided carotid endarterectomy. The patient was had her primary care provider at which time she became unsteady on her feet, needed support the leg down. She was brought to [facility] emergency room, ECG revealed sinus rhythm with frequent PVCs. Patient was admitted to telemetry she has remained in sinus rhythm with occasional PVCs. The patient has ruled out for myocardial infarction by CPK isoenzymes and troponin. Preoperative Lexiscan sestamibi study prior to carotid endarterectomy did not reveal evidence for ischemia. Echocardiogram on 09/08/15 revealed normal left ventricular function with LV ejection fraction of 55-65%. Patient denies chest pain or shortness of breath. She is not expressing palpitations or heart racing.  Review of systems complete and found to be negative unless listed above     Past Medical History  Diagnosis Date  . Hyperlipidemia   . Hypertension   . Diabetes mellitus without complication ([place])   . Memory changes   . COPD (chronic obstructive pulmonary disease) ([place])   . Anxiety   . Indigestion   . Stroke (cerebrum) ([place])   . Dementia   . Anemia     Past Surgical History  Procedure Laterality Date  . Abdominal hysterectomy      heavy bleeding  . Endarterectomy Left 08/11/2015    Procedure: ENDARTERECTOMY CAROTID;  Surgeon: Katha Cabal, MD;  Location: [facility] ORS;  Service: Vascular;  Laterality: Left;  . Peripheral vascular catheterization Left 08/11/2015    Procedure: Carotid Angiography;  Surgeon: Katha Cabal, MD;  Location: [place] CV LAB;  Service:  Cardiovascular;  Laterality: Left;  . Carotid artery - subclavian artery bypass graft      Prescriptions prior to admission  Medication Sig Dispense Refill Last Dose  . acetaminophen (TYLENOL) 325 MG tablet Take 1-2 tablets (325-650 mg total) by mouth every 4 (four) hours as needed for mild pain (or temp >/= 101 F). 30 tablet 1   . alum & mag hydroxide-simeth (MAALOX/MYLANTA) 200-200-20 MG/5ML suspension Take 15-30 mLs by mouth every 2 (two) hours as needed for indigestion. 355 mL 0   . antiseptic oral rinse (CPC / CETYLPYRIDINIUM CHLORIDE 0.05%) 0.05 % LIQD solution 7 mLs by Mouth Rinse route 2 times daily at 12 noon and 4 pm. 200 mL 1   . aspirin EC 81 MG EC tablet Take 1 tablet (81 mg total) by mouth daily. 30 tablet 11   . atorvastatin (LIPITOR) 80 MG tablet Take 1 tablet (80 mg total) by mouth daily at 6 PM. 120 tablet 4   . chlorhexidine (PERIDEX) 0.12 % solution 15 mLs by Mouth Rinse route 2 (two) times daily. 120 mL 0   . clopidogrel (PLAVIX) 75 MG tablet Take 1 tablet (75 mg total) by mouth daily with breakfast. 30 tablet 5   . docusate sodium (COLACE) 100 MG capsule Take 1 capsule (100 mg total) by mouth daily. 10 capsule 0   . donepezil (ARICEPT) 10 MG tablet Take 1 tablet (10 mg total) by mouth at bedtime. 30 tablet 1 Taking  . famotidine (PEPCID) 20 MG tablet Take 1 tablet (20 mg total) by mouth 2 (two) times daily. 60 tablet 5   . guaiFENesin-dextromethorphan (ROBITUSSIN DM) 100-10 MG/5ML  syrup Take 15 mLs by mouth every 4 (four) hours as needed for cough. 118 mL 0   . hydrochlorothiazide (HYDRODIURIL) 25 MG tablet Take 1 tablet (25 mg total) by mouth daily. 30 tablet 3 Taking  . LORazepam (ATIVAN) 0.5 MG tablet Take 1 tablet (0.5 mg total) by mouth every 6 (six) hours as needed for anxiety. 30 tablet 0   . metoprolol tartrate (LOPRESSOR) 25 MG tablet Take 0.5 tablets (12.5 mg total) by mouth 2 (two) times daily. 60 tablet 1   . mometasone-formoterol (DULERA) 100-5 MCG/ACT AERO  Inhale 2 puffs into the lungs 2 (two) times daily. 1 Inhaler 5   . nicotine (NICODERM CQ - DOSED IN MG/24 HOURS) 21 mg/24hr patch Place 1 patch (21 mg total) onto the skin daily. 28 patch 0   . sertraline (ZOLOFT) 50 MG tablet Take 1 tablet (50 mg total) by mouth at bedtime. 30 tablet 1 Taking  . feeding supplement, ENSURE ENLIVE, (ENSURE ENLIVE) LIQD Take 237 mLs by mouth 2 (two) times daily between meals. 237 mL 12 Taking  . morphine 2 MG/ML injection Inject 1-2.5 mLs (2-5 mg total) into the vein every hour as needed. 1 mL 0   . Nutritional Supplements (ENSURE NUTRITION SHAKE) LIQD Take 8 oz by mouth 2 (two) times daily. 237 mL 12 08/10/2015 at Unknown time   Social History   Social History  . Marital Status: Married    Spouse Name: N/A  . Number of Children: N/A  . Years of Education: N/A   Occupational History  . Not on file.   Social History Main Topics  . Smoking status: Current Every Day Smoker -- 1.00 packs/day  . Smokeless tobacco: Never Used  . Alcohol Use: No  . Drug Use: No  . Sexual Activity: Not on file   Other Topics Concern  . Not on file   Social History Narrative    Family History  Problem Relation Age of Onset  . Hypertension Mother   . Hypertension Father   . Heart disease Sister   . Hypertension Brother       Review of systems complete and found to be negative unless listed above      PHYSICAL EXAM  General: Well developed, well nourished, in no acute distress HEENT:  Normocephalic and atramatic Neck:  No JVD.  Lungs: Clear bilaterally to auscultation and percussion. Heart: HRRR . Normal S1 and S2 without gallops or murmurs.  Abdomen: Bowel sounds are positive, abdomen soft and non-tender  Msk:  Back normal, normal gait. Normal strength and tone for age. Extremities: No clubbing, cyanosis or edema.   Neuro: Alert and oriented X 3. Psych:  Good affect, responds appropriately  Labs:   Lab Results  Component Value Date   WBC 5.3  09/09/2015   HGB 8.1* 09/09/2015   HCT 25.1* 09/09/2015   MCV 84.0 09/09/2015   PLT 228 09/09/2015    Recent Labs Lab 09/09/15 0558  NA 143  K 3.5  CL 110  CO2 29  BUN 13  CREATININE 0.83  CALCIUM 8.5*  GLUCOSE 106*   Lab Results  Component Value Date   TROPONINI <0.03 09/09/2015    Lab Results  Component Value Date   CHOL 143 08/11/2015   Lab Results  Component Value Date   HDL 41 08/11/2015   Lab Results  Component Value Date   LDLCALC 88 08/11/2015   Lab Results  Component Value Date   TRIG 72 08/11/2015   Lab Results  Component Value Date   CHOLHDL 3.5 08/11/2015   No results found for: LDLDIRECT    Radiology: Dg Chest 2 View  09/08/2015  CLINICAL DATA:  80 year old who was recently in a rehabilitation facility after a stroke, recent left carotid endarterectomy 08/11/2015, presenting with recent generalized weakness and an acute near syncopal episode while at the physician's office earlier today. EXAM: CHEST  2 VIEW COMPARISON:  06/12/2015 and earlier. FINDINGS: Cardiac silhouette mildly enlarged, unchanged. Thoracic aorta atherosclerotic, unchanged. Hilar and mediastinal contours otherwise unremarkable. Hyperinflation and emphysematous changes throughout both lungs, unchanged. Mildly prominent bronchovascular markings diffusely and mild central peribronchial thickening, unchanged. Lungs otherwise clear. No localized airspace consolidation. No pleural effusions. No pneumothorax. Normal pulmonary vascularity. Degenerative changes throughout the thoracic spine. Proximal left common carotid artery stent. Atherosclerotic calcification of the great vessels projects over the lung apices. IMPRESSION: COPD/emphysema. No acute cardiopulmonary disease. Stable mild cardiomegaly. Electronically Signed   By: Evangeline Dakin M.D.   On: 09/08/2015 14:12   Ct Head Wo Contrast  08/11/2015  CLINICAL DATA:  Initial evaluation for expressive aphasia, history of recent carotid  endarterectomy and stent placement EXAM: CT HEAD WITHOUT CONTRAST TECHNIQUE: Contiguous axial images were obtained from the base of the skull through the vertex without intravenous contrast. COMPARISON:  Prior CT from 08/02/2015. FINDINGS: Generalized cerebral atrophy with chronic microvascular ischemic changes again seen, stable. No acute large vessel territory infarct. No intracranial hemorrhage. No mass lesion, midline shift, or mass effect. No extra-axial fluid collection. Scattered vascular calcifications within the carotid siphons. Scalp soft tissues demonstrate no acute abnormality. No acute abnormality about the orbits. Calvarium intact. Left frontal osteoma again noted. Small mild layering opacity within the left sphenoid sinus. Paranasal sinuses are otherwise clear. No mastoid effusion. IMPRESSION: 1. No acute intracranial process. 2. Moderate generalized atrophy with chronic small vessel ischemic disease. Electronically Signed   By: Jeannine Boga M.D.   On: 08/11/2015 22:30   Ct Angio Neck W/cm &/or Wo/cm  08/11/2015  CLINICAL DATA:  Left carotid endarterectomy today. Right arm weakness EXAM: CT ANGIOGRAPHY NECK TECHNIQUE: Multidetector CT imaging of the neck was performed using the standard protocol during bolus administration of intravenous contrast. Multiplanar CT image reconstructions and MIPs were obtained to evaluate the vascular anatomy. Carotid stenosis measurements (when applicable) are obtained utilizing NASCET criteria, using the distal internal carotid diameter as the denominator. CONTRAST:  33mL OMNIPAQUE IOHEXOL 350 MG/ML SOLN COMPARISON:  CTA neck 12/20/2014.  MR head 08/02/2015 FINDINGS: Aortic arch: Extensive atherosclerotic calcification throughout the aortic arch without aneurysm or dissection. Calcified plaque in the innominate artery, right subclavian artery, and proximal right common carotid artery. Atherosclerotic calcified plaque at the origin of the left common carotid  artery and left subclavian. Atherosclerotic disease throughout the left subclavian artery. Right carotid system: Calcified plaque in the mid right common carotid artery. Heavily calcified plaque at the right carotid bifurcation. Estimated 70% diameter stenosis of the right internal carotid artery. Mild to moderate stenosis origin of the right external carotid artery. Left carotid system: Postop left carotid endarterectomy with gas in the soft tissues of the left neck. No hematoma. Multiple small filling defects are present in the left common carotid, left internal carotid, and left external carotid arteries consistent with thrombus. There is a linear filling defect in the mid left common carotid artery which is likely a dissection. There is thrombus just above this dissection. These are nonocclusive. Left external carotid artery patent with atherosclerotic calcified plaque 1 cm distal to the  origin. Vertebral arteries:Both vertebral arteries patent to the basilar. Atherosclerotic calcification at the origin of the right vertebral artery. Both vertebral arteries are patent to the basilar. Mild stenosis distal left vertebral artery at the C1 level. Calcified plaque with moderate stenosis mid right vertebral artery. Skeleton: Moderate spondylosis and spinal stenosis C5-6 and C6-7. No acute skeletal abnormality. Other neck: Enhancing mass left parotid gland extending close to the deep lobe is unchanged from prior studies. Mass measures 18 x 22 mm. 5 mm enhancing nodule in the right parotid tail also unchanged. Lung apices clear. IMPRESSION: Postop left carotid endarterectomy today with gas in the soft tissues. 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 These are nonocclusive and are a source of emboli causing stroke. CTA head not performed day. Atherosclerotic  stenosis of the right carotid bifurcation with 70% diameter stenosis right internal carotid artery. 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 5 mm enhancing nodule right parotid tail also unchanged. Critical Value/emergent results were called by telephone at the time of interpretation on 08/11/2015 at 12:50 Pm to Dr. Hortencia Pilar , who verbally acknowledged these results. Electronically Signed   By: Franchot Gallo M.D.   On: 08/11/2015 13:10   Mr Brain Wo Contrast  08/12/2015  CLINICAL DATA:  Recent LEFT carotid endarterectomy, requiring postoperative stent placement due to dissection with thrombus. Difficulty speaking, decreased level of consciousness. RIGHT hemiparesis. Initial encounter. EXAM: MRI HEAD WITHOUT CONTRAST TECHNIQUE: Multiplanar, multiecho pulse sequences of the brain and surrounding structures were obtained without intravenous contrast. COMPARISON:  CT angiography of the neck 08/11/2015. CT head 08/11/2015. Preoperative MRI brain 07/12/2015. FINDINGS: Multifocal areas of restricted diffusion, representing acute nonhemorrhagic infarction, affect the LEFT hemisphere. All lie within the LEFT MCA distribution. These include the LEFT posterior frontal cortex, LEFT parietal cortex, and adjacent subcortical white matter. Additional area of acute infarction affects the LEFT external capsule just lateral and inferior to the lentiform nucleus. Equivocal tiny areas of acute infarction affect the LEFT lateral occipital lobe and LEFT periventricular white matter. No abnormalities in the RIGHT hemisphere, brainstem, or cerebellum. Generalized atrophy. Moderate T2 and FLAIR hyperintensities throughout the periventricular and subcortical white matter representing chronic microvascular ischemic change. No acute hemorrhage is observed. No mass lesion, or extra-axial fluid. Flow voids are maintained in the internal  carotid arteries, basilar artery, both vertebral arteries. Specifically the LEFT cervical ICA is patent. In addition, there is no evidence for proximal LEFT middle cerebral artery disease. Unremarkable pituitary and cerebellar tonsils. Upper cervical region unremarkable. No sinus or mastoid disease. Soft tissue swelling in the LEFT neck. IMPRESSION: Multifocal areas of acute, nonhemorrhagic, LEFT hemisphere MCA territory infarction as described. Patency of the LEFT ICA and MCA is established. Atrophy and small vessel disease. Electronically Signed   By: Staci Righter M.D.   On: 08/12/2015 17:08    EKG: Sinus rhythm with intermittent PVCs  A*SSESSMENT AND PLAN:   1. Near syncope, atypical, in setting of recent CVA and left carotid endarterectomy, without evidence for ischemia on recent Lexiscan sestamibi study, with normal left ventricular function by echocardiogram, in sinus rhythm with asymptomatic PVCs 2. Intermittent PVCs of uncertain clinical significance. Patient has known normal left ventricular function, without evidence for ischemia on Lexiscan sestamibi study.  Recommendations  1. Continue current medications  2. Continue aspirin and Plavix 3. Continue metoprolol 4. Defer further cardiac diagnostics at this time. Follow-up with Dr. Ubaldo Glassing as outpatient   Signed: Isaias Cowman MD,PhD, [facility] 09/09/2015, 3:02 PM

## 2015-09-09 NOTE — Progress Notes (Signed)
Patient ID: Jasmine Arias, female   DOB: 10-Jun-1935, 80 y.o.   MRN: [id number] [facility] PROGRESS NOTE PCP: Park Liter, DO  HPI/Subjective: Patient feeling okay. Gets little dizzy in the room was spinning when she stood up. She does have a little headache. She lives with her husband. She has not noticed any blood in her bowel movements.  Objective: Filed Vitals:   09/09/15 0924 09/09/15 1045  BP: 124/49 128/50  Pulse: 83 68  Temp: 97.6 F (36.4 C) 98.2 F (36.8 C)  Resp: 20 20    Filed Weights   09/08/15 1302 09/08/15 1706  Weight: 52.617 kg (116 lb) 49.487 kg (109 lb 1.6 oz)    ROS: Review of Systems  Constitutional: Negative for fever and chills.  Eyes: Negative for blurred vision.  Respiratory: Negative for cough and shortness of breath.   Cardiovascular: Negative for chest pain.  Gastrointestinal: Negative for nausea, vomiting, abdominal pain, diarrhea and constipation.  Genitourinary: Negative for dysuria.  Musculoskeletal: Negative for joint pain.  Neurological: Positive for dizziness. Negative for headaches.   Exam: Physical Exam  HENT:  Nose: No mucosal edema.  Mouth/Throat: No oropharyngeal exudate or posterior oropharyngeal edema.  Eyes: Conjunctivae, EOM and lids are normal. Pupils are equal, round, and reactive to light.  Neck: No JVD present. Carotid bruit is not present. No edema present. No thyroid mass and no thyromegaly present.  Cardiovascular: S1 normal and S2 normal.  Exam reveals no gallop.   No murmur heard. Pulses:      Dorsalis pedis pulses are 2+ on the right side, and 2+ on the left side.  Respiratory: No respiratory distress. She has decreased breath sounds in the right lower field and the left lower field. She has no wheezes. She has rhonchi in the right middle field, the right lower field, the left middle field and the left lower field. She has no rales.  GI: Soft. Bowel sounds are normal. There is no tenderness.   Musculoskeletal:       Right ankle: She exhibits swelling.       Left ankle: She exhibits swelling.  Lymphadenopathy:    She has no cervical adenopathy.  Neurological: She is alert. No cranial nerve deficit.  Skin: Skin is warm. No rash noted. Nails show no clubbing.  Psychiatric: She has a normal mood and affect.    Data Reviewed: Basic Metabolic Panel:  Recent Labs Lab 09/08/15 1312 09/08/15 1712 09/09/15 0558  NA 141  --  143  K 3.4*  --  3.5  CL 103  --  110  CO2 31  --  29  GLUCOSE 172*  --  106*  BUN 15  --  13  CREATININE 0.88 0.80 0.83  CALCIUM 9.3  --  8.5*  MG  --  1.3* 1.8   CBC:  Recent Labs Lab 09/08/15 1312 09/08/15 1712 09/09/15 0558  WBC 7.6 7.1 5.3  HGB 9.4* 9.3* 8.1*  HCT 30.2* 28.6* 25.1*  MCV 84.5 83.1 84.0  PLT 287 249 228   CBG:  Recent Labs Lab 09/08/15 1710 09/08/15 2049 09/09/15 1154  GLUCAP 114* 148* 249*     Studies: Dg Chest 2 View  09/08/2015  CLINICAL DATA:  80 year old who was recently in a rehabilitation facility after a stroke, recent left carotid endarterectomy 08/11/2015, presenting with recent generalized weakness and an acute near syncopal episode while at the physician's office earlier today. EXAM: CHEST  2 VIEW COMPARISON:  06/12/2015 and earlier. FINDINGS: Cardiac  silhouette mildly enlarged, unchanged. Thoracic aorta atherosclerotic, unchanged. Hilar and mediastinal contours otherwise unremarkable. Hyperinflation and emphysematous changes throughout both lungs, unchanged. Mildly prominent bronchovascular markings diffusely and mild central peribronchial thickening, unchanged. Lungs otherwise clear. No localized airspace consolidation. No pleural effusions. No pneumothorax. Normal pulmonary vascularity. Degenerative changes throughout the thoracic spine. Proximal left common carotid artery stent. Atherosclerotic calcification of the great vessels projects over the lung apices. IMPRESSION: COPD/emphysema. No acute  cardiopulmonary disease. Stable mild cardiomegaly. Electronically Signed   By: Evangeline Dakin M.D.   On: 09/08/2015 14:12    Scheduled Meds: . aspirin EC  81 mg Oral Daily  . atorvastatin  80 mg Oral q1800  . budesonide (PULMICORT) nebulizer solution  0.25 mg Nebulization BID  . clopidogrel  75 mg Oral Q breakfast  . docusate sodium  100 mg Oral Daily  . donepezil  10 mg Oral QHS  . famotidine  20 mg Oral BID  . feeding supplement (ENSURE ENLIVE)  237 mL Oral BID BM  . ferrous sulfate  325 mg Oral TID WC  . heparin  5,000 Units Subcutaneous 3 times per day  . insulin aspart  0-5 Units Subcutaneous QHS  . insulin aspart  0-9 Units Subcutaneous TID WC  . ipratropium-albuterol  3 mL Nebulization Q6H  . iron sucrose  300 mg Intravenous Once  . metoprolol tartrate  12.5 mg Oral BID  . nicotine  21 mg Transdermal Daily  . potassium chloride  20 mEq Oral Daily  . sertraline  50 mg Oral QHS  . sodium chloride  3 mL Intravenous Q12H    Assessment/Plan:  1. Iron deficiency anemia- stop IV fluid hydration. Hemoglobin went down to 8.1 from 9.3. Ferritin is 10. I will guaiac stools. Give IV Venofer 300 mg. Continue ferrous sulfate. May end up needing a GI workup as outpatient. Check hemoglobin tomorrow morning. 2. Syncope- unclear etiology. Continue to monitor on telemetry. She does have some trigeminy and PVCs on monitor. Check orthostatic vital signs 3. Rhonchi in the lungs- start nebulizer treatments and budesonide nebulizer 4. History of stroke with right arm weakness- on aspirin and Plavix and statin 5. Essential hypertension continue usual medications 6. Type 2 diabetes mellitus on sliding scale 7. Hypokalemia, hypomagnesemia- replace magnesium IV and potassium orally. 8. Physical therapy evaluation  Code Status:     Code Status Orders        Start     Ordered   09/08/15 1711  Full code   Continuous     09/08/15 1710     Family Communication: Spoke with husband on the  phone Disposition Plan: Potentially home tomorrow  Time spent: 25 minutes  Loletha Grayer  [facility]

## 2015-09-10 DIAGNOSIS — I10 Essential (primary) hypertension: Secondary | ICD-10-CM | POA: Diagnosis not present

## 2015-09-10 DIAGNOSIS — I69398 Other sequelae of cerebral infarction: Secondary | ICD-10-CM | POA: Diagnosis not present

## 2015-09-10 DIAGNOSIS — E876 Hypokalemia: Secondary | ICD-10-CM | POA: Diagnosis not present

## 2015-09-10 DIAGNOSIS — J449 Chronic obstructive pulmonary disease, unspecified: Secondary | ICD-10-CM | POA: Diagnosis not present

## 2015-09-10 DIAGNOSIS — R008 Other abnormalities of heart beat: Secondary | ICD-10-CM | POA: Diagnosis not present

## 2015-09-10 DIAGNOSIS — R55 Syncope and collapse: Secondary | ICD-10-CM | POA: Diagnosis not present

## 2015-09-10 DIAGNOSIS — D500 Iron deficiency anemia secondary to blood loss (chronic): Secondary | ICD-10-CM | POA: Diagnosis not present

## 2015-09-10 DIAGNOSIS — M6281 Muscle weakness (generalized): Secondary | ICD-10-CM | POA: Diagnosis not present

## 2015-09-10 LAB — GLUCOSE, CAPILLARY
GLUCOSE-CAPILLARY: 117 mg/dL — AB (ref 65–99)
Glucose-Capillary: 263 mg/dL — ABNORMAL HIGH (ref 65–99)

## 2015-09-10 LAB — HEMOGLOBIN: HEMOGLOBIN: 8.3 g/dL — AB (ref 12.0–16.0)

## 2015-09-10 LAB — MAGNESIUM: MAGNESIUM: 1.8 mg/dL (ref 1.7–2.4)

## 2015-09-10 LAB — POTASSIUM: POTASSIUM: 3.3 mmol/L — AB (ref 3.5–5.1)

## 2015-09-10 MED ORDER — POTASSIUM CHLORIDE CRYS ER 20 MEQ PO TBCR: EXTENDED_RELEASE_TABLET | ORAL | Status: DC

## 2015-09-10 MED ORDER — MAGNESIUM SULFATE 2 GM/50ML IV SOLN
2.0000 g | Freq: Once | INTRAVENOUS | Status: AC
  Administered 2015-09-10: 2 g via INTRAVENOUS
  Filled 2015-09-10: qty 50

## 2015-09-10 MED ORDER — PANTOPRAZOLE SODIUM 40 MG PO TBEC: 40.0000 mg | DELAYED_RELEASE_TABLET | Freq: Every day | ORAL | Status: DC

## 2015-09-10 MED ORDER — MAGNESIUM OXIDE 400 (241.3 MG) MG PO TABS
400.0000 mg | ORAL_TABLET | Freq: Every day | ORAL | Status: DC
  Administered 2015-09-10: 400 mg via ORAL
  Filled 2015-09-10: qty 1

## 2015-09-10 MED ORDER — POTASSIUM CHLORIDE CRYS ER 20 MEQ PO TBCR
40.0000 meq | EXTENDED_RELEASE_TABLET | Freq: Once | ORAL | Status: AC
  Administered 2015-09-10: 40 meq via ORAL

## 2015-09-10 MED ORDER — MAGNESIUM OXIDE 400 (241.3 MG) MG PO TABS: 400.0000 mg | ORAL_TABLET | Freq: Every day | ORAL | Status: DC

## 2015-09-10 MED ORDER — FERROUS SULFATE 325 (65 FE) MG PO TABS: 325.0000 mg | ORAL_TABLET | Freq: Three times a day (TID) | ORAL | Status: DC

## 2015-09-10 NOTE — Discharge Instructions (Addendum)
Near-Syncope Near-syncope (commonly known as near fainting) is sudden weakness, dizziness, or feeling like you might pass out. During an episode of near-syncope, you may also develop pale skin, have tunnel vision, or feel sick to your stomach (nauseous). Near-syncope may occur when getting up after sitting or while standing for a long time. It is caused by a sudden decrease in blood flow to the brain. This decrease can result from various causes or triggers, most of which are not serious. However, because near-syncope can sometimes be a sign of something serious, a medical evaluation is required. The specific cause is often not determined. HOME CARE INSTRUCTIONS  Monitor your condition for any changes. The following actions may help to alleviate any discomfort you are experiencing:  Have someone stay with you until you feel stable.  Lie down right away and prop your feet up if you start feeling like you might faint. Breathe deeply and steadily. Wait until all the symptoms have passed. Most of these episodes last only a few minutes. You may feel tired for several hours.   Drink enough fluids to keep your urine clear or pale yellow.   If you are taking blood pressure or heart medicine, get up slowly when seated or lying down. Take several minutes to sit and then stand. This can reduce dizziness.  Follow up with your health care provider as directed. SEEK IMMEDIATE MEDICAL CARE IF:   You have a severe headache.   You have unusual pain in the chest, abdomen, or back.   You are bleeding from the mouth or rectum, or you have black or tarry stool.   You have an irregular or very fast heartbeat.   You have repeated fainting or have seizure-like jerking during an episode.   You faint when sitting or lying down.   You have confusion.   You have difficulty walking.   You have severe weakness.   You have vision problems.  MAKE SURE YOU:   Understand these instructions.  Will  watch your condition.  Will get help right away if you are not doing well or get worse.   This information is not intended to replace advice given to you by your health care provider. Make sure you discuss any questions you have with your health care provider.   Document Released: 09/16/2005 Document Revised: 09/21/2013 Document Reviewed: 02/19/2013 Elsevier Interactive Patient Education 2016 [employer].     I recommend weekly blood counts with primary care physician to monitor hemoglobin. If hemoglobin drops may have to get rid of Plavix also. Can be referred over to the [facility] as outpatient for IV iron. Can get gastroenterology consultation as outpatient, but with the recent stroke they probably won't do anything at this point.

## 2015-09-10 NOTE — Care Management Note (Signed)
Case Management Note  Patient Details  Name: GOLDA MANDO MRN: [id number] Date of Birth: 1935/04/26  Subjective/Objective:   Attempted to discuss discharge planning with Mrs Condrey and her husband who is very hard of hearing and has somewhat garbled speech. Mr Kollmann stated that Mrs Lillo' family doctor had set up home health for Mrs Chamu but they did not know the name of the company. To make certain that Mrs Carda does have a provider for home health PT, RN, Aid, this writer faxed a referral to [facility] for home health RN, PT, Aid to check to see if there is already another home health provider currently providing home health services to Mrs Saca. This Probation officer is awaiting an update from [place].                   Action/Plan:   Expected Discharge Date:                  Expected Discharge Plan:     In-House Referral:     Discharge planning Services     Post Acute Care Choice:    Choice offered to:     DME Arranged:    DME Agency:     HH Arranged:    [facility] Agency:     Status of Service:     Medicare Important Message Given:    Date Medicare IM Given:    Medicare IM give by:    Date Additional Medicare IM Given:    Additional Medicare Important Message give by:     If discussed at [place] of Stay Meetings, dates discussed:    Additional Comments:  Amiliana Foutz A, RN 09/10/2015, 11:21 AM

## 2015-09-10 NOTE — Progress Notes (Signed)
Patient moved to room 243 due to impulsiveness. Husband (Mr. Hevin Houseal) notified of the room change. No acute distress noted, no s/s of pain. Will continue to monitor.

## 2015-09-10 NOTE — Discharge Summary (Signed)
[facility] [place] NAME: Jasmine Arias    MR#:  [id number]  DATE OF BIRTH:  04/21/1935  DATE OF ADMISSION:  09/08/2015 ADMITTING PHYSICIAN: Aldean Jewett, MD  DATE OF DISCHARGE: 09/10/2015  PRIMARY CARE PHYSICIAN: Park Liter, DO    ADMISSION DIAGNOSIS:  Ventricular trigeminy [I49.8] Near syncope [R55]  DISCHARGE DIAGNOSIS:  Active Problems:   Trigeminy   Syncope   SECONDARY DIAGNOSIS:   Past Medical History  Diagnosis Date  . Hyperlipidemia   . Hypertension   . Diabetes mellitus without complication ([place])   . Memory changes   . COPD (chronic obstructive pulmonary disease) ([place])   . Anxiety   . Indigestion   . Stroke (cerebrum) ([place])   . Dementia   . Anemia     HOSPITAL COURSE:   1. Syncope. Unclear etiology. On telemetry he does have PVCs but is in sinus rhythm. Patient seen in [place] by cardiology and no further workup. 2. Iron deficiency anemia. Hemoglobin upon discharge 8.3. I think this is partly dilutional from IV fluids. I did give IV Venofer 300 mg. I did prescribe ferrous sulfate. I spoke with vascular surgery and I will remove aspirin and continue Plavix because of the recent stroke and carotid endarterectomy. I will give Protonix. With recent stroke, I don't think GI would do any procedures at this point. Must follow-up with primary care physician closely for follow-up of blood counts. Recommend hemoglobin on a weekly basis with PMD. 3. History of stroke with right arm weakness, left carotid endarterectomy. I spoke with Dr. Lucky Cowboy covering for Dr. Delana Meyer, it is okay to get rid of the aspirin at this point and continue Plavix. 4. Hypokalemia and hypomagnesemia- stop hydrochlorothiazide this is not a good medication for this patient. Replace potassium for another 5 days and continue magnesium supplementation. IV magnesium given here. 5. Weakness physical therapy recommended home with home health 6.  Rhonchi in the lungs yesterday- clear today. Patient must stop smoking.  DISCHARGE CONDITIONS:   Fair  CONSULTS OBTAINED:  Treatment Team:  Isaias Cowman, MD  DRUG ALLERGIES:  No Known Allergies  DISCHARGE MEDICATIONS:   Current Discharge Medication List    START taking these medications   Details  ferrous sulfate 325 (65 FE) MG tablet Take 1 tablet (325 mg total) by mouth 3 (three) times daily with meals. Qty: 90 tablet, Refills: 0    magnesium oxide (MAG-OX) 400 (241.3 MG) MG tablet Take 1 tablet (400 mg total) by mouth daily. Qty: 30 tablet, Refills: 0    pantoprazole (PROTONIX) 40 MG tablet Take 1 tablet (40 mg total) by mouth daily. Qty: 30 tablet, Refills: 0    potassium chloride SA (K-DUR,KLOR-CON) 20 MEQ tablet One tab daily for five days Qty: 5 tablet, Refills: 0      CONTINUE these medications which have NOT CHANGED   Details  acetaminophen (TYLENOL) 325 MG tablet Take 1-2 tablets (325-650 mg total) by mouth every 4 (four) hours as needed for mild pain (or temp >/= 101 F). Qty: 30 tablet, Refills: 1    alum & mag hydroxide-simeth (MAALOX/MYLANTA) [id number] MG/5ML suspension Take 15-30 mLs by mouth every 2 (two) hours as needed for indigestion. Qty: 355 mL, Refills: 0    atorvastatin (LIPITOR) 80 MG tablet Take 1 tablet (80 mg total) by mouth daily at 6 PM. Qty: 120 tablet, Refills: 4    clopidogrel (PLAVIX) 75 MG tablet Take 1 tablet (75 mg total) by mouth  daily with breakfast. Qty: 30 tablet, Refills: 5    docusate sodium (COLACE) 100 MG capsule Take 1 capsule (100 mg total) by mouth daily. Qty: 10 capsule, Refills: 0    donepezil (ARICEPT) 10 MG tablet Take 1 tablet (10 mg total) by mouth at bedtime. Qty: 30 tablet, Refills: 1    guaiFENesin-dextromethorphan (ROBITUSSIN DM) 100-10 MG/5ML syrup Take 15 mLs by mouth every 4 (four) hours as needed for cough. Qty: 118 mL, Refills: 0    LORazepam (ATIVAN) 0.5 MG tablet Take 1 tablet (0.5 mg total)  by mouth every 6 (six) hours as needed for anxiety. Qty: 30 tablet, Refills: 0    metoprolol tartrate (LOPRESSOR) 25 MG tablet Take 0.5 tablets (12.5 mg total) by mouth 2 (two) times daily. Qty: 60 tablet, Refills: 1    mometasone-formoterol (DULERA) 100-5 MCG/ACT AERO Inhale 2 puffs into the lungs 2 (two) times daily. Qty: 1 Inhaler, Refills: 5    nicotine (NICODERM CQ - DOSED IN MG/24 HOURS) 21 mg/24hr patch Place 1 patch (21 mg total) onto the skin daily. Qty: 28 patch, Refills: 0    sertraline (ZOLOFT) 50 MG tablet Take 1 tablet (50 mg total) by mouth at bedtime. Qty: 30 tablet, Refills: 1    Nutritional Supplements (ENSURE NUTRITION SHAKE) LIQD Take 8 oz by mouth 2 (two) times daily. Qty: 237 mL, Refills: 12   Associated Diagnoses: Malnutrition ([place])      STOP taking these medications     antiseptic oral rinse (CPC / CETYLPYRIDINIUM CHLORIDE 0.05%) 0.05 % LIQD solution      aspirin EC 81 MG EC tablet      chlorhexidine (PERIDEX) 0.12 % solution      famotidine (PEPCID) 20 MG tablet      hydrochlorothiazide (HYDRODIURIL) 25 MG tablet      morphine 2 MG/ML injection          DISCHARGE INSTRUCTIONS:   Follow-up with PMD one week  If you experience worsening of your admission symptoms, develop shortness of breath, life threatening emergency, suicidal or homicidal thoughts you must seek medical attention immediately by calling 911 or calling your MD immediately  if symptoms less severe.  You Must read complete instructions/literature along with all the possible adverse reactions/side effects for all the Medicines you take and that have been prescribed to you. Take any new Medicines after you have completely understood and accept all the possible adverse reactions/side effects.   Please note  You were cared for by a hospitalist during your hospital stay. If you have any questions about your discharge medications or the care you received while you were in the hospital  after you are discharged, you can call the unit and asked to speak with the hospitalist on call if the hospitalist that took care of you is not available. Once you are discharged, your primary care physician will handle any further medical issues. Please note that NO REFILLS for any discharge medications will be authorized once you are discharged, as it is imperative that you return to your primary care physician (or establish a relationship with a primary care physician if you do not have one) for your aftercare needs so that they can reassess your need for medications and monitor your lab values.    Today   CHIEF COMPLAINT:   Chief Complaint  Patient presents with  . Near Syncope    HISTORY OF PRESENT ILLNESS:  Jasmine Arias  is a 80 y.o. female presented with syncope. Found to have  trigeminy bigeminy and PVCs on heart monitor.   VITAL SIGNS:  Blood pressure 128/58, pulse 78, temperature 98.5 F (36.9 C), temperature source Oral, resp. rate 18, height 5' 1.5" (1.562 m), weight 49.487 kg (109 lb 1.6 oz), SpO2 97 %.   PHYSICAL EXAMINATION:  GENERAL:  80 y.o.-year-old patient lying in the bed with no acute distress.  EYES: Pupils equal, round, reactive to light and accommodation. No scleral icterus. Extraocular muscles intact.  HEENT: Head atraumatic, normocephalic. Oropharynx and nasopharynx clear.  NECK:  Supple, no jugular venous distention. No thyroid enlargement, no tenderness.  LUNGS: Normal breath sounds bilaterally, no wheezing, rales,rhonchi or crepitation. No use of accessory muscles of respiration.  CARDIOVASCULAR: S1, S2 normal. 2/6 systolic murmur, no rubs, or gallops.  ABDOMEN: Soft, non-tender, non-distended. Bowel sounds present. No organomegaly or mass.  EXTREMITIES: No pedal edema, cyanosis, or clubbing.  NEUROLOGIC: Cranial nerves II through XII are intact. Muscle strength 5/5 in all extremities. Sensation intact. Gait not checked.  PSYCHIATRIC: The patient is alert  and oriented x 3.  SKIN: No obvious rash, lesion, or ulcer.   DATA REVIEW:   CBC  Recent Labs Lab 09/09/15 0558 09/10/15 0740  WBC 5.3  --   HGB 8.1* 8.3*  HCT 25.1*  --   PLT 228  --     Chemistries   Recent Labs Lab 09/09/15 0558 09/10/15 0740  NA 143  --   K 3.5 3.3*  CL 110  --   CO2 29  --   GLUCOSE 106*  --   BUN 13  --   CREATININE 0.83  --   CALCIUM 8.5*  --   MG 1.8 1.8    Cardiac Enzymes  Recent Labs Lab 09/09/15 0558  TROPONINI <0.03    Microbiology Results  Results for orders placed or performed during the hospital encounter of 08/11/15  MRSA PCR Screening     Status: None   Collection Time: 08/11/15  5:49 PM  Result Value Ref Range Status   MRSA by PCR NEGATIVE NEGATIVE Final    Comment:        The GeneXpert MRSA Assay (FDA approved for NASAL specimens only), is one component of a comprehensive MRSA colonization surveillance program. It is not intended to diagnose MRSA infection nor to guide or monitor treatment for MRSA infections.     RADIOLOGY:  Dg Chest 2 View  09/08/2015  CLINICAL DATA:  80 year old who was recently in a rehabilitation facility after a stroke, recent left carotid endarterectomy 08/11/2015, presenting with recent generalized weakness and an acute near syncopal episode while at the physician's office earlier today. EXAM: CHEST  2 VIEW COMPARISON:  06/12/2015 and earlier. FINDINGS: Cardiac silhouette mildly enlarged, unchanged. Thoracic aorta atherosclerotic, unchanged. Hilar and mediastinal contours otherwise unremarkable. Hyperinflation and emphysematous changes throughout both lungs, unchanged. Mildly prominent bronchovascular markings diffusely and mild central peribronchial thickening, unchanged. Lungs otherwise clear. No localized airspace consolidation. No pleural effusions. No pneumothorax. Normal pulmonary vascularity. Degenerative changes throughout the thoracic spine. Proximal left common carotid artery stent.  Atherosclerotic calcification of the great vessels projects over the lung apices. IMPRESSION: COPD/emphysema. No acute cardiopulmonary disease. Stable mild cardiomegaly. Electronically Signed   By: Evangeline Dakin M.D.   On: 09/08/2015 14:12     Management plans discussed with the patient, family and they are in agreement.  CODE STATUS:     Code Status Orders        Start     Ordered   09/08/15 1711  Full code   Continuous     09/08/15 1710      TOTAL TIME TAKING CARE OF THIS PATIENT: 35 minutes.    Loletha Grayer M.D on 09/10/2015 at 8:43 AM  Between 7am to 6pm - Pager - [phone]  After 6pm go to [email or web address] - password [email or web address]  Office  [phone]  CC: Primary care physician; Park Liter, DO

## 2015-09-11 ENCOUNTER — Telehealth: Payer: Self-pay | Admitting: Family Medicine

## 2015-09-11 DIAGNOSIS — M6281 Muscle weakness (generalized): Secondary | ICD-10-CM | POA: Diagnosis not present

## 2015-09-11 DIAGNOSIS — Z720 Tobacco use: Secondary | ICD-10-CM | POA: Diagnosis not present

## 2015-09-11 DIAGNOSIS — F0390 Unspecified dementia without behavioral disturbance: Secondary | ICD-10-CM | POA: Diagnosis not present

## 2015-09-11 DIAGNOSIS — J449 Chronic obstructive pulmonary disease, unspecified: Secondary | ICD-10-CM | POA: Diagnosis not present

## 2015-09-11 DIAGNOSIS — F419 Anxiety disorder, unspecified: Secondary | ICD-10-CM | POA: Diagnosis not present

## 2015-09-11 DIAGNOSIS — Z7902 Long term (current) use of antithrombotics/antiplatelets: Secondary | ICD-10-CM | POA: Diagnosis not present

## 2015-09-11 DIAGNOSIS — K219 Gastro-esophageal reflux disease without esophagitis: Secondary | ICD-10-CM | POA: Diagnosis not present

## 2015-09-11 DIAGNOSIS — I4891 Unspecified atrial fibrillation: Secondary | ICD-10-CM | POA: Diagnosis not present

## 2015-09-11 DIAGNOSIS — I10 Essential (primary) hypertension: Secondary | ICD-10-CM | POA: Diagnosis not present

## 2015-09-11 DIAGNOSIS — E1151 Type 2 diabetes mellitus with diabetic peripheral angiopathy without gangrene: Secondary | ICD-10-CM | POA: Diagnosis not present

## 2015-09-11 DIAGNOSIS — D649 Anemia, unspecified: Secondary | ICD-10-CM | POA: Diagnosis not present

## 2015-09-11 DIAGNOSIS — I69398 Other sequelae of cerebral infarction: Secondary | ICD-10-CM | POA: Diagnosis not present

## 2015-09-11 NOTE — Telephone Encounter (Deleted)
Can we please get her scheduled for a hospital follow up this week? Thanks!

## 2015-09-11 NOTE — Telephone Encounter (Signed)
Patient already scheduled for appointment Thursday. Called patient to see how she is doing. She states that she is not doing too well since she got out of the hospital. She went to pick up her medications. She states that she is doing OK and doesn't need anything at this time.

## 2015-09-12 DIAGNOSIS — D649 Anemia, unspecified: Secondary | ICD-10-CM | POA: Diagnosis not present

## 2015-09-12 DIAGNOSIS — I10 Essential (primary) hypertension: Secondary | ICD-10-CM | POA: Diagnosis not present

## 2015-09-12 DIAGNOSIS — M6281 Muscle weakness (generalized): Secondary | ICD-10-CM | POA: Diagnosis not present

## 2015-09-12 DIAGNOSIS — K219 Gastro-esophageal reflux disease without esophagitis: Secondary | ICD-10-CM | POA: Diagnosis not present

## 2015-09-12 DIAGNOSIS — F0390 Unspecified dementia without behavioral disturbance: Secondary | ICD-10-CM | POA: Diagnosis not present

## 2015-09-12 DIAGNOSIS — F419 Anxiety disorder, unspecified: Secondary | ICD-10-CM | POA: Diagnosis not present

## 2015-09-12 DIAGNOSIS — Z7902 Long term (current) use of antithrombotics/antiplatelets: Secondary | ICD-10-CM | POA: Diagnosis not present

## 2015-09-12 DIAGNOSIS — Z720 Tobacco use: Secondary | ICD-10-CM | POA: Diagnosis not present

## 2015-09-12 DIAGNOSIS — J449 Chronic obstructive pulmonary disease, unspecified: Secondary | ICD-10-CM | POA: Diagnosis not present

## 2015-09-12 DIAGNOSIS — I69398 Other sequelae of cerebral infarction: Secondary | ICD-10-CM | POA: Diagnosis not present

## 2015-09-12 DIAGNOSIS — E1151 Type 2 diabetes mellitus with diabetic peripheral angiopathy without gangrene: Secondary | ICD-10-CM | POA: Diagnosis not present

## 2015-09-12 DIAGNOSIS — I4891 Unspecified atrial fibrillation: Secondary | ICD-10-CM | POA: Diagnosis not present

## 2015-09-13 ENCOUNTER — Telehealth: Payer: Self-pay | Admitting: Family Medicine

## 2015-09-13 NOTE — Telephone Encounter (Signed)
Verbal order from [place], called and left message with verbal order.

## 2015-09-13 NOTE — Telephone Encounter (Signed)
Amy from [facility] called and is in need of home health aid orders to assist with ADL and OT to help with shoulder mobility. Please call her ASAP. Thanks.

## 2015-09-14 ENCOUNTER — Encounter: Payer: Self-pay | Admitting: Family Medicine

## 2015-09-14 ENCOUNTER — Telehealth: Payer: Self-pay | Admitting: Family Medicine

## 2015-09-14 ENCOUNTER — Ambulatory Visit (INDEPENDENT_AMBULATORY_CARE_PROVIDER_SITE_OTHER): Payer: Medicare Other | Admitting: Family Medicine

## 2015-09-14 VITALS — BP 104/62 | HR 109 | Temp 98.6°F | Ht 60.0 in | Wt 110.0 lb

## 2015-09-14 DIAGNOSIS — E46 Unspecified protein-calorie malnutrition: Secondary | ICD-10-CM

## 2015-09-14 DIAGNOSIS — D509 Iron deficiency anemia, unspecified: Secondary | ICD-10-CM

## 2015-09-14 DIAGNOSIS — I4891 Unspecified atrial fibrillation: Secondary | ICD-10-CM | POA: Diagnosis not present

## 2015-09-14 DIAGNOSIS — Z7902 Long term (current) use of antithrombotics/antiplatelets: Secondary | ICD-10-CM | POA: Diagnosis not present

## 2015-09-14 DIAGNOSIS — E1151 Type 2 diabetes mellitus with diabetic peripheral angiopathy without gangrene: Secondary | ICD-10-CM | POA: Diagnosis not present

## 2015-09-14 DIAGNOSIS — R42 Dizziness and giddiness: Secondary | ICD-10-CM

## 2015-09-14 DIAGNOSIS — Z720 Tobacco use: Secondary | ICD-10-CM | POA: Diagnosis not present

## 2015-09-14 DIAGNOSIS — R008 Other abnormalities of heart beat: Secondary | ICD-10-CM

## 2015-09-14 DIAGNOSIS — D649 Anemia, unspecified: Secondary | ICD-10-CM | POA: Diagnosis not present

## 2015-09-14 DIAGNOSIS — I951 Orthostatic hypotension: Secondary | ICD-10-CM | POA: Diagnosis not present

## 2015-09-14 DIAGNOSIS — F419 Anxiety disorder, unspecified: Secondary | ICD-10-CM | POA: Diagnosis not present

## 2015-09-14 DIAGNOSIS — K219 Gastro-esophageal reflux disease without esophagitis: Secondary | ICD-10-CM | POA: Diagnosis not present

## 2015-09-14 DIAGNOSIS — I498 Other specified cardiac arrhythmias: Secondary | ICD-10-CM

## 2015-09-14 DIAGNOSIS — I10 Essential (primary) hypertension: Secondary | ICD-10-CM

## 2015-09-14 DIAGNOSIS — M6281 Muscle weakness (generalized): Secondary | ICD-10-CM | POA: Diagnosis not present

## 2015-09-14 DIAGNOSIS — F0390 Unspecified dementia without behavioral disturbance: Secondary | ICD-10-CM | POA: Diagnosis not present

## 2015-09-14 DIAGNOSIS — I69398 Other sequelae of cerebral infarction: Secondary | ICD-10-CM | POA: Diagnosis not present

## 2015-09-14 DIAGNOSIS — J449 Chronic obstructive pulmonary disease, unspecified: Secondary | ICD-10-CM | POA: Diagnosis not present

## 2015-09-14 NOTE — Assessment & Plan Note (Signed)
Continue iron. Recheck hemoglobin today, return for recheck next week and when she returns for follow up in 2 weeks.

## 2015-09-14 NOTE — Telephone Encounter (Signed)
Can we please get her in on Monday for a CBC, and then next Tuesday as well? She has dementia, so please talk to her husband when you call. Orders are in.

## 2015-09-14 NOTE — Assessment & Plan Note (Signed)
Continue metoprolol- restarting today after being out of the hospital. Check BP in 2 weeks to see how she's doing.

## 2015-09-14 NOTE — Assessment & Plan Note (Signed)
To follow up with Dr. Ubaldo Glassing.

## 2015-09-14 NOTE — Assessment & Plan Note (Signed)
Significant concern about her and her husband's ability to care for her at home. Social worker, skilled nursing and PT/OT established with them. Discussed with patient and husband today, that if she continues not doing well, we may need to consider getting her into assisted living or a nursing home where they are able to take better care of her and make sure she eats.

## 2015-09-14 NOTE — Assessment & Plan Note (Signed)
Stop HCTZ. Rechecking labs today, will stop potassium and magnesium if normal.

## 2015-09-14 NOTE — Progress Notes (Signed)
BP 104/62 mmHg  Pulse 109  Temp(Src) 98.6 F (37 C)  Ht 5' (1.524 m)  Wt 110 lb (49.896 kg)  BMI 21.48 kg/m2  SpO2 99%  LMP  (LMP Unknown)   Subjective:    Patient ID: Jasmine Arias, female    DOB: 1935/04/28, 80 y.o.   MRN: [id number]  HPI: Jasmine Arias is a 80 y.o. female  Chief Complaint  Patient presents with  . Hospitalization Follow-up    discharged this past Sunday, medications are NOT reconciled   Still feeling dizzy, wasn't dizzy, when she got home. She will only eat 1x a day, and then will get dizzy.  Husband is trying to get her to eat, but she says that she's not hungry. She doesn't remember that she hasn't eaten and thinks that she is eating 2x a day. Has not picked up the medications from the pharmacy. Doesn't have metoprolol or dulera or plavix. Picking them up today.   Hospital FOLLOW UP Time since discharge: 4 days Hospital/facility: [facility] Diagnosis: near syncope, ventricular trigeminy, iron deficiency anemia Consultants: Cardiology New medications: iron, potassium, magnesium Discharge instructions:  Follow up here and with Dr. Ubaldo Glassing, weekly hemoglobins Status: stable  Relevant past medical, surgical, family and social history reviewed and updated as indicated. Interim medical history since our last visit reviewed. Allergies and medications reviewed and updated.  Review of Systems  Constitutional: Negative.   Respiratory: Negative.   Cardiovascular: Negative.   Neurological: Positive for dizziness. Negative for tremors, seizures, syncope, facial asymmetry, speech difficulty, weakness, light-headedness, numbness and headaches.  Psychiatric/Behavioral: Negative.     Per HPI unless specifically indicated above     Objective:    BP 104/62 mmHg  Pulse 109  Temp(Src) 98.6 F (37 C)  Ht 5' (1.524 m)  Wt 110 lb (49.896 kg)  BMI 21.48 kg/m2  SpO2 99%  LMP  (LMP Unknown)  Wt Readings from Last 3 Encounters:  09/14/15 110 lb (49.896 kg)  09/08/15  109 lb 1.6 oz (49.487 kg)  09/08/15 112 lb 1.6 oz (50.848 kg)    Orthostatic VS for the past 24 hrs:  BP- Lying Pulse- Lying BP- Sitting Pulse- Sitting BP- Standing at 0 minutes Pulse- Standing at 0 minutes  09/14/15 1014 139/73 mmHg 99 111/60 mmHg 103 112/67 mmHg 116     Physical Exam  Constitutional: She is oriented to person, place, and time. She appears well-developed and well-nourished. No distress.  HENT:  Head: Normocephalic and atraumatic.  Right Ear: Hearing normal.  Left Ear: Hearing normal.  Nose: Nose normal.  Eyes: Conjunctivae and lids are normal. Right eye exhibits no discharge. Left eye exhibits no discharge. No scleral icterus.  Cardiovascular: Normal rate, regular rhythm and intact distal pulses.  Exam reveals no gallop and no friction rub.   Murmur heard. Pulmonary/Chest: Effort normal and breath sounds normal. No respiratory distress. She has no wheezes. She has no rales. She exhibits no tenderness.  Musculoskeletal: Normal range of motion.  Neurological: She is alert and oriented to person, place, and time.  Skin: Skin is warm, dry and intact. No rash noted. No erythema. No pallor.  Psychiatric: She has a normal mood and affect. Her speech is normal and behavior is normal. Judgment and thought content normal. Cognition and memory are normal.    Results for orders placed or performed during the hospital encounter of [id number]  Basic metabolic panel  Result Value Ref Range   Sodium 141 135 - 145 mmol/L   Potassium  3.4 (L) 3.5 - 5.1 mmol/L   Chloride 103 101 - 111 mmol/L   CO2 31 22 - 32 mmol/L   Glucose, Bld 172 (H) 65 - 99 mg/dL   BUN 15 6 - 20 mg/dL   Creatinine, Ser 0.88 0.44 - 1.00 mg/dL   Calcium 9.3 8.9 - 10.3 mg/dL   GFR calc non Af Amer >60 >60 mL/min   GFR calc Af Amer >60 >60 mL/min   Anion gap 7 5 - 15  CBC  Result Value Ref Range   WBC 7.6 3.6 - 11.0 K/uL   RBC 3.57 (L) 3.80 - 5.20 MIL/uL   Hemoglobin 9.4 (L) 12.0 - 16.0 g/dL   HCT 30.2 (L)  35.0 - 47.0 %   MCV 84.5 80.0 - 100.0 fL   MCH 26.4 26.0 - 34.0 pg   MCHC 31.2 (L) 32.0 - 36.0 g/dL   RDW 16.2 (H) 11.5 - 14.5 %   Platelets 287 150 - 440 K/uL  Urinalysis complete, with microscopic ([facility] only)  Result Value Ref Range   Color, Urine YELLOW (A) YELLOW   APPearance CLEAR (A) CLEAR   Glucose, UA NEGATIVE NEGATIVE mg/dL   Bilirubin Urine NEGATIVE NEGATIVE   Ketones, ur TRACE (A) NEGATIVE mg/dL   Specific Gravity, Urine 1.023 1.005 - 1.030   Hgb urine dipstick NEGATIVE NEGATIVE   pH 5.0 5.0 - 8.0   Protein, ur 30 (A) NEGATIVE mg/dL   Nitrite NEGATIVE NEGATIVE   Leukocytes, UA NEGATIVE NEGATIVE   RBC / HPF 0-5 0 - 5 RBC/hpf   WBC, UA 0-5 0 - 5 WBC/hpf   Bacteria, UA NONE SEEN NONE SEEN   Squamous Epithelial / LPF 0-5 (A) NONE SEEN   Mucous PRESENT   Troponin I  Result Value Ref Range   Troponin I <0.03 <0.031 ng/mL  Troponin I  Result Value Ref Range   Troponin I <0.03 <0.031 ng/mL  Hemoglobin A1c  Result Value Ref Range   Hgb A1c MFr Bld 6.1 (H) 4.0 - 6.0 %  Magnesium  Result Value Ref Range   Magnesium 1.3 (L) 1.7 - 2.4 mg/dL  Vitamin B12  Result Value Ref Range   Vitamin B-12 247 180 - 914 pg/mL  Folate  Result Value Ref Range   Folate 17.3 >5.9 ng/mL  Iron and TIBC  Result Value Ref Range   Iron 18 (L) 28 - 170 ug/dL   TIBC 352 250 - 450 ug/dL   Saturation Ratios 5 (L) 10.4 - 31.8 %   UIBC 334 ug/dL  Ferritin  Result Value Ref Range   Ferritin 10 (L) 11 - 307 ng/mL  Reticulocytes  Result Value Ref Range   Retic Ct Pct 1.9 0.4 - 3.1 %   RBC. 3.44 (L) 3.80 - 5.20 MIL/uL   Retic Count, Manual 65.4 19.0 - 183.0 K/uL  Troponin I (q 6hr x 3)  Result Value Ref Range   Troponin I <0.03 <0.031 ng/mL  Troponin I (q 6hr x 3)  Result Value Ref Range   Troponin I <0.03 <0.031 ng/mL  Troponin I (q 6hr x 3)  Result Value Ref Range   Troponin I <0.03 <0.031 ng/mL  CBC  Result Value Ref Range   WBC 7.1 3.6 - 11.0 K/uL   RBC 3.44 (L) 3.80 - 5.20 MIL/uL    Hemoglobin 9.3 (L) 12.0 - 16.0 g/dL   HCT 28.6 (L) 35.0 - 47.0 %   MCV 83.1 80.0 - 100.0 fL   MCH 27.0  26.0 - 34.0 pg   MCHC 32.5 32.0 - 36.0 g/dL   RDW 16.3 (H) 11.5 - 14.5 %   Platelets 249 150 - 440 K/uL  Creatinine, serum  Result Value Ref Range   Creatinine, Ser 0.80 0.44 - 1.00 mg/dL   GFR calc non Af Amer >60 >60 mL/min   GFR calc Af Amer >60 >60 mL/min  Glucose, capillary  Result Value Ref Range   Glucose-Capillary 114 (H) 65 - 99 mg/dL   Comment 1 Notify RN   CBC  Result Value Ref Range   WBC 5.3 3.6 - 11.0 K/uL   RBC 2.99 (L) 3.80 - 5.20 MIL/uL   Hemoglobin 8.1 (L) 12.0 - 16.0 g/dL   HCT 25.1 (L) 35.0 - 47.0 %   MCV 84.0 80.0 - 100.0 fL   MCH 27.0 26.0 - 34.0 pg   MCHC 32.1 32.0 - 36.0 g/dL   RDW 16.2 (H) 11.5 - 14.5 %   Platelets 228 150 - 440 K/uL  Basic metabolic panel  Result Value Ref Range   Sodium 143 135 - 145 mmol/L   Potassium 3.5 3.5 - 5.1 mmol/L   Chloride 110 101 - 111 mmol/L   CO2 29 22 - 32 mmol/L   Glucose, Bld 106 (H) 65 - 99 mg/dL   BUN 13 6 - 20 mg/dL   Creatinine, Ser 0.83 0.44 - 1.00 mg/dL   Calcium 8.5 (L) 8.9 - 10.3 mg/dL   GFR calc non Af Amer >60 >60 mL/min   GFR calc Af Amer >60 >60 mL/min   Anion gap 4 (L) 5 - 15  Magnesium  Result Value Ref Range   Magnesium 1.8 1.7 - 2.4 mg/dL  Glucose, capillary  Result Value Ref Range   Glucose-Capillary 148 (H) 65 - 99 mg/dL  Glucose, capillary  Result Value Ref Range   Glucose-Capillary 249 (H) 65 - 99 mg/dL   Comment 1 Notify RN   Glucose, capillary  Result Value Ref Range   Glucose-Capillary 124 (H) 65 - 99 mg/dL   Comment 1 Notify RN   Glucose, capillary  Result Value Ref Range   Glucose-Capillary 137 (H) 65 - 99 mg/dL  Hemoglobin  Result Value Ref Range   Hemoglobin 8.3 (L) 12.0 - 16.0 g/dL  Potassium  Result Value Ref Range   Potassium 3.3 (L) 3.5 - 5.1 mmol/L  Magnesium  Result Value Ref Range   Magnesium 1.8 1.7 - 2.4 mg/dL  Glucose, capillary  Result Value Ref  Range   Glucose-Capillary 117 (H) 65 - 99 mg/dL  Glucose, capillary  Result Value Ref Range   Glucose-Capillary 263 (H) 65 - 99 mg/dL      Assessment & Plan:   Problem List Items Addressed This Visit      Cardiovascular and Mediastinum   Essential hypertension    Continue metoprolol- restarting today after being out of the hospital. Check BP in 2 weeks to see how she's doing.       Orthostatic hypotension    Stop HCTZ. Rechecking labs today, will stop potassium and magnesium if normal.         Other   Malnutrition ([place])    Significant concern about her and her husband's ability to care for her at home. Social worker, skilled nursing and PT/OT established with them. Discussed with patient and husband today, that if she continues not doing well, we may need to consider getting her into assisted living or a nursing home where they are able  to take better care of her and make sure she eats.       Trigeminy    To follow up with Dr. Ubaldo Glassing.       Iron deficiency anemia    Continue iron. Recheck hemoglobin today, return for recheck next week and when she returns for follow up in 2 weeks.       Relevant Orders   CBC with Differential/Platelet    Other Visit Diagnoses    Dizziness    -  Primary    Relevant Orders    Comprehensive metabolic panel    Magnesium    Phosphorus        Follow up plan: Return in about 3 weeks (around 10/05/2015).

## 2015-09-15 ENCOUNTER — Telehealth: Payer: Self-pay | Admitting: Family Medicine

## 2015-09-15 LAB — PHOSPHORUS: PHOSPHORUS: 3.2 mg/dL (ref 2.5–4.5)

## 2015-09-15 LAB — COMPREHENSIVE METABOLIC PANEL
ALBUMIN: 4.1 g/dL (ref 3.5–4.8)
ALK PHOS: 98 IU/L (ref 39–117)
ALT: 22 IU/L (ref 0–32)
AST: 26 IU/L (ref 0–40)
Albumin/Globulin Ratio: 1.4 (ref 1.1–2.5)
BILIRUBIN TOTAL: 0.2 mg/dL (ref 0.0–1.2)
BUN / CREAT RATIO: 15 (ref 11–26)
BUN: 14 mg/dL (ref 8–27)
CHLORIDE: 101 mmol/L (ref 96–106)
CO2: 24 mmol/L (ref 18–29)
Calcium: 10.5 mg/dL — ABNORMAL HIGH (ref 8.7–10.3)
Creatinine, Ser: 0.94 mg/dL (ref 0.57–1.00)
GFR calc Af Amer: 67 mL/min/{1.73_m2} (ref >59)
GFR calc non Af Amer: 58 mL/min/{1.73_m2} — ABNORMAL LOW (ref >59)
GLUCOSE: 110 mg/dL — AB (ref 65–99)
Globulin, Total: 3 g/dL (ref 1.5–4.5)
Potassium: 4.6 mmol/L (ref 3.5–5.2)
SODIUM: 143 mmol/L (ref 134–144)
Total Protein: 7.1 g/dL (ref 6.0–8.5)

## 2015-09-15 LAB — MAGNESIUM: Magnesium: 1.7 mg/dL (ref 1.6–2.3)

## 2015-09-15 NOTE — Telephone Encounter (Signed)
Please let her know that her labs came back normal. Thanks! 

## 2015-09-15 NOTE — Telephone Encounter (Signed)
Patient's husband notified  

## 2015-09-18 ENCOUNTER — Other Ambulatory Visit: Payer: Medicare Other

## 2015-09-18 DIAGNOSIS — D509 Iron deficiency anemia, unspecified: Secondary | ICD-10-CM | POA: Diagnosis not present

## 2015-09-18 DIAGNOSIS — J449 Chronic obstructive pulmonary disease, unspecified: Secondary | ICD-10-CM | POA: Diagnosis not present

## 2015-09-18 DIAGNOSIS — Z7902 Long term (current) use of antithrombotics/antiplatelets: Secondary | ICD-10-CM | POA: Diagnosis not present

## 2015-09-18 DIAGNOSIS — F419 Anxiety disorder, unspecified: Secondary | ICD-10-CM | POA: Diagnosis not present

## 2015-09-18 DIAGNOSIS — Z720 Tobacco use: Secondary | ICD-10-CM | POA: Diagnosis not present

## 2015-09-18 DIAGNOSIS — F0390 Unspecified dementia without behavioral disturbance: Secondary | ICD-10-CM | POA: Diagnosis not present

## 2015-09-18 DIAGNOSIS — I10 Essential (primary) hypertension: Secondary | ICD-10-CM | POA: Diagnosis not present

## 2015-09-18 DIAGNOSIS — K219 Gastro-esophageal reflux disease without esophagitis: Secondary | ICD-10-CM | POA: Diagnosis not present

## 2015-09-18 DIAGNOSIS — I69398 Other sequelae of cerebral infarction: Secondary | ICD-10-CM | POA: Diagnosis not present

## 2015-09-18 DIAGNOSIS — D649 Anemia, unspecified: Secondary | ICD-10-CM | POA: Diagnosis not present

## 2015-09-18 DIAGNOSIS — I4891 Unspecified atrial fibrillation: Secondary | ICD-10-CM | POA: Diagnosis not present

## 2015-09-18 DIAGNOSIS — E1151 Type 2 diabetes mellitus with diabetic peripheral angiopathy without gangrene: Secondary | ICD-10-CM | POA: Diagnosis not present

## 2015-09-18 DIAGNOSIS — M6281 Muscle weakness (generalized): Secondary | ICD-10-CM | POA: Diagnosis not present

## 2015-09-19 ENCOUNTER — Telehealth: Payer: Self-pay | Admitting: Family Medicine

## 2015-09-19 LAB — CBC WITH DIFFERENTIAL/PLATELET
BASOS: 0 %
Basophils Absolute: 0 10*3/uL (ref 0.0–0.2)
EOS (ABSOLUTE): 0.1 10*3/uL (ref 0.0–0.4)
EOS: 2 %
HEMATOCRIT: 34.3 % (ref 34.0–46.6)
Hemoglobin: 10.7 g/dL — ABNORMAL LOW (ref 11.1–15.9)
IMMATURE GRANS (ABS): 0 10*3/uL (ref 0.0–0.1)
IMMATURE GRANULOCYTES: 0 %
LYMPHS: 22 %
Lymphocytes Absolute: 1 10*3/uL (ref 0.7–3.1)
MCH: 26.4 pg — ABNORMAL LOW (ref 26.6–33.0)
MCHC: 31.2 g/dL — ABNORMAL LOW (ref 31.5–35.7)
MCV: 85 fL (ref 79–97)
Monocytes Absolute: 0.3 10*3/uL (ref 0.1–0.9)
Monocytes: 7 %
NEUTROS PCT: 69 %
Neutrophils Absolute: 3.2 10*3/uL (ref 1.4–7.0)
PLATELETS: 339 10*3/uL (ref 150–379)
RBC: 4.05 x10E6/uL (ref 3.77–5.28)
RDW: 17.2 % — ABNORMAL HIGH (ref 12.3–15.4)
WBC: 4.7 10*3/uL (ref 3.4–10.8)

## 2015-09-19 NOTE — Telephone Encounter (Signed)
I spoke with patient's husband Explained CBC looks much better than when she was in the hospital She was just here last week and has upcoming appt October 05, 2015

## 2015-09-20 DIAGNOSIS — K219 Gastro-esophageal reflux disease without esophagitis: Secondary | ICD-10-CM | POA: Diagnosis not present

## 2015-09-20 DIAGNOSIS — Z720 Tobacco use: Secondary | ICD-10-CM | POA: Diagnosis not present

## 2015-09-20 DIAGNOSIS — F0390 Unspecified dementia without behavioral disturbance: Secondary | ICD-10-CM | POA: Diagnosis not present

## 2015-09-20 DIAGNOSIS — I4891 Unspecified atrial fibrillation: Secondary | ICD-10-CM | POA: Diagnosis not present

## 2015-09-20 DIAGNOSIS — F419 Anxiety disorder, unspecified: Secondary | ICD-10-CM | POA: Diagnosis not present

## 2015-09-20 DIAGNOSIS — J449 Chronic obstructive pulmonary disease, unspecified: Secondary | ICD-10-CM | POA: Diagnosis not present

## 2015-09-20 DIAGNOSIS — D649 Anemia, unspecified: Secondary | ICD-10-CM | POA: Diagnosis not present

## 2015-09-20 DIAGNOSIS — I69398 Other sequelae of cerebral infarction: Secondary | ICD-10-CM | POA: Diagnosis not present

## 2015-09-20 DIAGNOSIS — Z7902 Long term (current) use of antithrombotics/antiplatelets: Secondary | ICD-10-CM | POA: Diagnosis not present

## 2015-09-20 DIAGNOSIS — E1151 Type 2 diabetes mellitus with diabetic peripheral angiopathy without gangrene: Secondary | ICD-10-CM | POA: Diagnosis not present

## 2015-09-20 DIAGNOSIS — M6281 Muscle weakness (generalized): Secondary | ICD-10-CM | POA: Diagnosis not present

## 2015-09-20 DIAGNOSIS — I10 Essential (primary) hypertension: Secondary | ICD-10-CM | POA: Diagnosis not present

## 2015-09-26 DIAGNOSIS — D649 Anemia, unspecified: Secondary | ICD-10-CM | POA: Diagnosis not present

## 2015-09-26 DIAGNOSIS — E1151 Type 2 diabetes mellitus with diabetic peripheral angiopathy without gangrene: Secondary | ICD-10-CM | POA: Diagnosis not present

## 2015-09-26 DIAGNOSIS — M6281 Muscle weakness (generalized): Secondary | ICD-10-CM | POA: Diagnosis not present

## 2015-09-26 DIAGNOSIS — K219 Gastro-esophageal reflux disease without esophagitis: Secondary | ICD-10-CM | POA: Diagnosis not present

## 2015-09-26 DIAGNOSIS — I4891 Unspecified atrial fibrillation: Secondary | ICD-10-CM | POA: Diagnosis not present

## 2015-09-26 DIAGNOSIS — Z7902 Long term (current) use of antithrombotics/antiplatelets: Secondary | ICD-10-CM | POA: Diagnosis not present

## 2015-09-26 DIAGNOSIS — I69398 Other sequelae of cerebral infarction: Secondary | ICD-10-CM | POA: Diagnosis not present

## 2015-09-26 DIAGNOSIS — F419 Anxiety disorder, unspecified: Secondary | ICD-10-CM | POA: Diagnosis not present

## 2015-09-26 DIAGNOSIS — Z720 Tobacco use: Secondary | ICD-10-CM | POA: Diagnosis not present

## 2015-09-26 DIAGNOSIS — J449 Chronic obstructive pulmonary disease, unspecified: Secondary | ICD-10-CM | POA: Diagnosis not present

## 2015-09-26 DIAGNOSIS — I10 Essential (primary) hypertension: Secondary | ICD-10-CM | POA: Diagnosis not present

## 2015-09-26 DIAGNOSIS — F0390 Unspecified dementia without behavioral disturbance: Secondary | ICD-10-CM | POA: Diagnosis not present

## 2015-09-27 DIAGNOSIS — I69398 Other sequelae of cerebral infarction: Secondary | ICD-10-CM | POA: Diagnosis not present

## 2015-09-27 DIAGNOSIS — J449 Chronic obstructive pulmonary disease, unspecified: Secondary | ICD-10-CM | POA: Diagnosis not present

## 2015-09-27 DIAGNOSIS — F419 Anxiety disorder, unspecified: Secondary | ICD-10-CM | POA: Diagnosis not present

## 2015-09-27 DIAGNOSIS — K219 Gastro-esophageal reflux disease without esophagitis: Secondary | ICD-10-CM | POA: Diagnosis not present

## 2015-09-27 DIAGNOSIS — D649 Anemia, unspecified: Secondary | ICD-10-CM | POA: Diagnosis not present

## 2015-09-27 DIAGNOSIS — Z7902 Long term (current) use of antithrombotics/antiplatelets: Secondary | ICD-10-CM | POA: Diagnosis not present

## 2015-09-27 DIAGNOSIS — F0390 Unspecified dementia without behavioral disturbance: Secondary | ICD-10-CM | POA: Diagnosis not present

## 2015-09-27 DIAGNOSIS — E1151 Type 2 diabetes mellitus with diabetic peripheral angiopathy without gangrene: Secondary | ICD-10-CM | POA: Diagnosis not present

## 2015-09-27 DIAGNOSIS — I4891 Unspecified atrial fibrillation: Secondary | ICD-10-CM | POA: Diagnosis not present

## 2015-09-27 DIAGNOSIS — M6281 Muscle weakness (generalized): Secondary | ICD-10-CM | POA: Diagnosis not present

## 2015-09-27 DIAGNOSIS — Z720 Tobacco use: Secondary | ICD-10-CM | POA: Diagnosis not present

## 2015-09-27 DIAGNOSIS — I10 Essential (primary) hypertension: Secondary | ICD-10-CM | POA: Diagnosis not present

## 2015-10-04 ENCOUNTER — Telehealth: Payer: Self-pay | Admitting: Family Medicine

## 2015-10-04 DIAGNOSIS — I10 Essential (primary) hypertension: Secondary | ICD-10-CM | POA: Diagnosis not present

## 2015-10-04 DIAGNOSIS — Z720 Tobacco use: Secondary | ICD-10-CM | POA: Diagnosis not present

## 2015-10-04 DIAGNOSIS — K219 Gastro-esophageal reflux disease without esophagitis: Secondary | ICD-10-CM | POA: Diagnosis not present

## 2015-10-04 DIAGNOSIS — I4891 Unspecified atrial fibrillation: Secondary | ICD-10-CM | POA: Diagnosis not present

## 2015-10-04 DIAGNOSIS — I69398 Other sequelae of cerebral infarction: Secondary | ICD-10-CM | POA: Diagnosis not present

## 2015-10-04 DIAGNOSIS — J449 Chronic obstructive pulmonary disease, unspecified: Secondary | ICD-10-CM | POA: Diagnosis not present

## 2015-10-04 DIAGNOSIS — Z7902 Long term (current) use of antithrombotics/antiplatelets: Secondary | ICD-10-CM | POA: Diagnosis not present

## 2015-10-04 DIAGNOSIS — M6281 Muscle weakness (generalized): Secondary | ICD-10-CM | POA: Diagnosis not present

## 2015-10-04 DIAGNOSIS — D649 Anemia, unspecified: Secondary | ICD-10-CM | POA: Diagnosis not present

## 2015-10-04 DIAGNOSIS — E1151 Type 2 diabetes mellitus with diabetic peripheral angiopathy without gangrene: Secondary | ICD-10-CM | POA: Diagnosis not present

## 2015-10-04 NOTE — Telephone Encounter (Signed)
Pt and husband requested home health discharge today. Pt complains of being dizzy all the time, has no appetite and is not eating.

## 2015-10-05 ENCOUNTER — Encounter: Payer: Self-pay | Admitting: Family Medicine

## 2015-10-05 ENCOUNTER — Ambulatory Visit (INDEPENDENT_AMBULATORY_CARE_PROVIDER_SITE_OTHER): Payer: Medicare Other | Admitting: Family Medicine

## 2015-10-05 VITALS — BP 138/60 | HR 102 | Temp 98.3°F | Ht 60.3 in | Wt 109.6 lb

## 2015-10-05 DIAGNOSIS — E46 Unspecified protein-calorie malnutrition: Secondary | ICD-10-CM | POA: Diagnosis not present

## 2015-10-05 DIAGNOSIS — I10 Essential (primary) hypertension: Secondary | ICD-10-CM

## 2015-10-05 DIAGNOSIS — D509 Iron deficiency anemia, unspecified: Secondary | ICD-10-CM | POA: Diagnosis not present

## 2015-10-05 MED ORDER — MIRTAZAPINE 15 MG PO TABS: 15.0000 mg | ORAL_TABLET | Freq: Every day | ORAL | Status: DC

## 2015-10-05 NOTE — Telephone Encounter (Signed)
Routing to provider  

## 2015-10-05 NOTE — Telephone Encounter (Signed)
New referral generated today as husband told them to stop coming.

## 2015-10-05 NOTE — Assessment & Plan Note (Signed)
Did not take her medication today. Take medication every day. Continue current regimen. Recheck next visit. Home health coming in, will call if BP is consistently elevated.

## 2015-10-05 NOTE — Assessment & Plan Note (Signed)
Significant concern about her and her husband's ability to care for her at home. Social worker, skilled nursing and PT/OT established with them, but husband said he didn't want them there. Is willing to have them back, will call again. Discussed with patient and husband today, that if she continues not doing well, we may need to consider getting her into assisted living or a nursing home where they are able to take better care of her and make sure she eats. Will start remeron and recheck in 1 month.

## 2015-10-05 NOTE — Assessment & Plan Note (Signed)
Rechecking labs today. Await results.  

## 2015-10-05 NOTE — Telephone Encounter (Signed)
Patient and her husband are not sure what's going on. Can we find out who is still coming and who is not? Thanks!

## 2015-10-05 NOTE — Addendum Note (Signed)
Addended by: Valerie Roys on: 10/05/2015 01:01 PM   Modules accepted: Miquel Dunn

## 2015-10-05 NOTE — Progress Notes (Addendum)
BP 138/60 mmHg  Pulse 102  Temp(Src) 98.3 F (36.8 C)  Ht 5' 0.3" (1.532 m)  Wt 109 lb 9.6 oz (49.714 kg)  BMI 21.18 kg/m2  SpO2 96%  LMP  (LMP Unknown)   Subjective:    Patient ID: Jasmine Arias, female    DOB: Sep 04, 1935, 81 y.o.   MRN: [id number]  HPI: Jasmine Arias is a 81 y.o. female  Chief Complaint  Patient presents with  . Hypertension  . Dizziness    pt states she has been feeling dizzy for about a week now. Pt states she is not eating a lot.   ANEMIA Anemia status: better Etiology of anemia: iron deficiency Duration of anemia treatment: 1 month Compliance with treatment: good compliance Iron supplementation side effects: no Severity of anemia: moderate Fatigue: yes Decreased exercise tolerance: yes  Dyspnea on exertion: yes Palpitations: yes Bleeding: no Pica: no  HYPERTENSION- didn't take metoprolol today Hypertension status: uncontrolled  Satisfied with current treatment? no Duration of hypertension: chronic BP monitoring frequency:  not checking BP medication side effects:  no Medication compliance: good compliance Aspirin: no Recurrent headaches: no Visual changes: no Palpitations: yes Dyspnea: no Chest pain: no Lower extremity edema: no Dizzy/lightheaded: yes  Relevant past medical, surgical, family and social history reviewed and updated as indicated. Interim medical history since our last visit reviewed. Allergies and medications reviewed and updated.  Review of Systems  Constitutional: Negative.   HENT: Negative.   Respiratory: Negative.   Cardiovascular: Negative.   Neurological: Positive for dizziness. Negative for tremors, seizures, syncope, facial asymmetry, speech difficulty, weakness, light-headedness, numbness and headaches.  Psychiatric/Behavioral: Negative.     Per HPI unless specifically indicated above     Objective:    BP 138/60 mmHg  Pulse 102  Temp(Src) 98.3 F (36.8 C)  Ht 5' 0.3" (1.532 m)  Wt 109 lb 9.6  oz (49.714 kg)  BMI 21.18 kg/m2  SpO2 96%  LMP  (LMP Unknown)  Wt Readings from Last 3 Encounters:  10/05/15 109 lb 9.6 oz (49.714 kg)  09/14/15 110 lb (49.896 kg)  09/08/15 109 lb 1.6 oz (49.487 kg)    Physical Exam  Constitutional: She is oriented to person, place, and time. She appears well-developed. No distress.  HENT:  Head: Normocephalic and atraumatic.  Right Ear: Hearing and external ear normal.  Left Ear: Hearing and external ear normal.  Nose: Nose normal.  Mouth/Throat: Oropharynx is clear and moist. No oropharyngeal exudate.  Eyes: Conjunctivae, EOM and lids are normal. Pupils are equal, round, and reactive to light. Right eye exhibits no discharge. Left eye exhibits no discharge. No scleral icterus.  Neck: Normal range of motion. Neck supple. No JVD present. No tracheal deviation present. No thyromegaly present.  Cardiovascular: Normal rate and intact distal pulses.  An irregular rhythm present. Exam reveals no gallop and no friction rub.   No murmur heard. Pulmonary/Chest: Effort normal and breath sounds normal. No stridor. No respiratory distress. She has no wheezes. She has no rales. She exhibits no tenderness.  Musculoskeletal: Normal range of motion.  Lymphadenopathy:    She has no cervical adenopathy.  Neurological: She is alert and oriented to person, place, and time.  Skin: Skin is warm, dry and intact. No rash noted. She is not diaphoretic. No erythema. No pallor.  Psychiatric: She has a normal mood and affect. Her speech is normal and behavior is normal. Judgment and thought content normal. Cognition and memory are normal.  Nursing note and vitals  reviewed.   Results for orders placed or performed in visit on 09/18/15  CBC with Differential/Platelet  Result Value Ref Range   WBC 4.7 3.4 - 10.8 x10E3/uL   RBC 4.05 3.77 - 5.28 x10E6/uL   Hemoglobin 10.7 (L) 11.1 - 15.9 g/dL   Hematocrit 34.3 34.0 - 46.6 %   MCV 85 79 - 97 fL   MCH 26.4 (L) 26.6 - 33.0 pg    MCHC 31.2 (L) 31.5 - 35.7 g/dL   RDW 17.2 (H) 12.3 - 15.4 %   Platelets 339 150 - 379 x10E3/uL   Neutrophils 69 %   Lymphs 22 %   Monocytes 7 %   Eos 2 %   Basos 0 %   Neutrophils Absolute 3.2 1.4 - 7.0 x10E3/uL   Lymphocytes Absolute 1.0 0.7 - 3.1 x10E3/uL   Monocytes Absolute 0.3 0.1 - 0.9 x10E3/uL   EOS (ABSOLUTE) 0.1 0.0 - 0.4 x10E3/uL   Basophils Absolute 0.0 0.0 - 0.2 x10E3/uL   Immature Granulocytes 0 %   Immature Grans (Abs) 0.0 0.0 - 0.1 x10E3/uL      Assessment & Plan:   Problem List Items Addressed This Visit      Cardiovascular and Mediastinum   Essential hypertension - Primary    Did not take her medication today. Take medication every day. Continue current regimen. Recheck next visit. Home health coming in, will call if BP is consistently elevated.         Other   Malnutrition ([place])    Significant concern about her and her husband's ability to care for her at home. Social worker, skilled nursing and PT/OT established with them, but husband said he didn't want them there. Is willing to have them back, will call again. Discussed with patient and husband today, that if she continues not doing well, we may need to consider getting her into assisted living or a nursing home where they are able to take better care of her and make sure she eats. Will start remeron and recheck in 1 month.          Iron deficiency anemia    Rechecking labs today. Await results.           Follow up plan: Return in about 4 weeks (around 11/02/2015) for Follow up.

## 2015-10-06 ENCOUNTER — Telehealth: Payer: Self-pay

## 2015-10-06 ENCOUNTER — Encounter: Payer: Self-pay | Admitting: Family Medicine

## 2015-10-06 DIAGNOSIS — D509 Iron deficiency anemia, unspecified: Secondary | ICD-10-CM | POA: Diagnosis not present

## 2015-10-06 DIAGNOSIS — Z7984 Long term (current) use of oral hypoglycemic drugs: Secondary | ICD-10-CM | POA: Diagnosis not present

## 2015-10-06 DIAGNOSIS — Z8673 Personal history of transient ischemic attack (TIA), and cerebral infarction without residual deficits: Secondary | ICD-10-CM | POA: Diagnosis not present

## 2015-10-06 DIAGNOSIS — G309 Alzheimer's disease, unspecified: Secondary | ICD-10-CM | POA: Diagnosis not present

## 2015-10-06 DIAGNOSIS — I10 Essential (primary) hypertension: Secondary | ICD-10-CM | POA: Diagnosis not present

## 2015-10-06 DIAGNOSIS — M6281 Muscle weakness (generalized): Secondary | ICD-10-CM | POA: Diagnosis not present

## 2015-10-06 DIAGNOSIS — E46 Unspecified protein-calorie malnutrition: Secondary | ICD-10-CM | POA: Diagnosis not present

## 2015-10-06 DIAGNOSIS — E119 Type 2 diabetes mellitus without complications: Secondary | ICD-10-CM | POA: Diagnosis not present

## 2015-10-06 DIAGNOSIS — R2689 Other abnormalities of gait and mobility: Secondary | ICD-10-CM | POA: Diagnosis not present

## 2015-10-06 DIAGNOSIS — Z7902 Long term (current) use of antithrombotics/antiplatelets: Secondary | ICD-10-CM | POA: Diagnosis not present

## 2015-10-06 LAB — CBC WITH DIFFERENTIAL/PLATELET
BASOS ABS: 0 10*3/uL (ref 0.0–0.2)
BASOS: 1 %
EOS (ABSOLUTE): 0 10*3/uL (ref 0.0–0.4)
Eos: 1 %
Hematocrit: 35.2 % (ref 34.0–46.6)
Hemoglobin: 11.5 g/dL (ref 11.1–15.9)
Immature Grans (Abs): 0 10*3/uL (ref 0.0–0.1)
Immature Granulocytes: 0 %
LYMPHS ABS: 0.8 10*3/uL (ref 0.7–3.1)
Lymphs: 20 %
MCH: 26.9 pg (ref 26.6–33.0)
MCHC: 32.7 g/dL (ref 31.5–35.7)
MCV: 82 fL (ref 79–97)
Monocytes Absolute: 0.3 10*3/uL (ref 0.1–0.9)
Monocytes: 7 %
NEUTROS ABS: 2.8 10*3/uL (ref 1.4–7.0)
Neutrophils: 71 %
PLATELETS: 244 10*3/uL (ref 150–379)
RBC: 4.28 x10E6/uL (ref 3.77–5.28)
RDW: 16.7 % — ABNORMAL HIGH (ref 12.3–15.4)
WBC: 4 10*3/uL (ref 3.4–10.8)

## 2015-10-06 NOTE — Telephone Encounter (Signed)
Totally fine.  Thanks

## 2015-10-06 NOTE — Telephone Encounter (Signed)
Jasmine Arias from [facility] called wanting a verbal order for the following PT: Once a week for one week  PT: Twice a week for four weeks Also a Occupational and Speech Therapy Evaluation.

## 2015-10-06 NOTE — Telephone Encounter (Signed)
Called and gave verbal order.

## 2015-10-10 DIAGNOSIS — M6281 Muscle weakness (generalized): Secondary | ICD-10-CM | POA: Diagnosis not present

## 2015-10-10 DIAGNOSIS — Z8673 Personal history of transient ischemic attack (TIA), and cerebral infarction without residual deficits: Secondary | ICD-10-CM | POA: Diagnosis not present

## 2015-10-10 DIAGNOSIS — Z7984 Long term (current) use of oral hypoglycemic drugs: Secondary | ICD-10-CM | POA: Diagnosis not present

## 2015-10-10 DIAGNOSIS — G309 Alzheimer's disease, unspecified: Secondary | ICD-10-CM | POA: Diagnosis not present

## 2015-10-10 DIAGNOSIS — I10 Essential (primary) hypertension: Secondary | ICD-10-CM | POA: Diagnosis not present

## 2015-10-10 DIAGNOSIS — Z7902 Long term (current) use of antithrombotics/antiplatelets: Secondary | ICD-10-CM | POA: Diagnosis not present

## 2015-10-10 DIAGNOSIS — D509 Iron deficiency anemia, unspecified: Secondary | ICD-10-CM | POA: Diagnosis not present

## 2015-10-10 DIAGNOSIS — E46 Unspecified protein-calorie malnutrition: Secondary | ICD-10-CM | POA: Diagnosis not present

## 2015-10-10 DIAGNOSIS — E119 Type 2 diabetes mellitus without complications: Secondary | ICD-10-CM | POA: Diagnosis not present

## 2015-10-10 DIAGNOSIS — R2689 Other abnormalities of gait and mobility: Secondary | ICD-10-CM | POA: Diagnosis not present

## 2015-10-11 ENCOUNTER — Telehealth: Payer: Self-pay | Admitting: Family Medicine

## 2015-10-11 DIAGNOSIS — R2689 Other abnormalities of gait and mobility: Secondary | ICD-10-CM | POA: Diagnosis not present

## 2015-10-11 DIAGNOSIS — Z8673 Personal history of transient ischemic attack (TIA), and cerebral infarction without residual deficits: Secondary | ICD-10-CM | POA: Diagnosis not present

## 2015-10-11 DIAGNOSIS — M6281 Muscle weakness (generalized): Secondary | ICD-10-CM | POA: Diagnosis not present

## 2015-10-11 DIAGNOSIS — I10 Essential (primary) hypertension: Secondary | ICD-10-CM | POA: Diagnosis not present

## 2015-10-11 DIAGNOSIS — D509 Iron deficiency anemia, unspecified: Secondary | ICD-10-CM | POA: Diagnosis not present

## 2015-10-11 DIAGNOSIS — G309 Alzheimer's disease, unspecified: Secondary | ICD-10-CM | POA: Diagnosis not present

## 2015-10-11 DIAGNOSIS — E46 Unspecified protein-calorie malnutrition: Secondary | ICD-10-CM | POA: Diagnosis not present

## 2015-10-11 DIAGNOSIS — E119 Type 2 diabetes mellitus without complications: Secondary | ICD-10-CM | POA: Diagnosis not present

## 2015-10-11 DIAGNOSIS — Z7984 Long term (current) use of oral hypoglycemic drugs: Secondary | ICD-10-CM | POA: Diagnosis not present

## 2015-10-11 DIAGNOSIS — Z7902 Long term (current) use of antithrombotics/antiplatelets: Secondary | ICD-10-CM | POA: Diagnosis not present

## 2015-10-11 NOTE — Telephone Encounter (Signed)
Called and LVM with verbal order.

## 2015-10-11 NOTE — Telephone Encounter (Signed)
Dr.Johnson is this ok?

## 2015-10-11 NOTE — Telephone Encounter (Signed)
Jasmine Arias(OT) would like to get a verbal order for 1 time a week for 1 week and 2 times a week for three weeks.

## 2015-10-11 NOTE — Telephone Encounter (Signed)
That's fine

## 2015-10-12 DIAGNOSIS — D509 Iron deficiency anemia, unspecified: Secondary | ICD-10-CM | POA: Diagnosis not present

## 2015-10-12 DIAGNOSIS — E119 Type 2 diabetes mellitus without complications: Secondary | ICD-10-CM | POA: Diagnosis not present

## 2015-10-12 DIAGNOSIS — Z8673 Personal history of transient ischemic attack (TIA), and cerebral infarction without residual deficits: Secondary | ICD-10-CM | POA: Diagnosis not present

## 2015-10-12 DIAGNOSIS — Z7984 Long term (current) use of oral hypoglycemic drugs: Secondary | ICD-10-CM | POA: Diagnosis not present

## 2015-10-12 DIAGNOSIS — R2689 Other abnormalities of gait and mobility: Secondary | ICD-10-CM | POA: Diagnosis not present

## 2015-10-12 DIAGNOSIS — E46 Unspecified protein-calorie malnutrition: Secondary | ICD-10-CM | POA: Diagnosis not present

## 2015-10-12 DIAGNOSIS — M6281 Muscle weakness (generalized): Secondary | ICD-10-CM | POA: Diagnosis not present

## 2015-10-12 DIAGNOSIS — G309 Alzheimer's disease, unspecified: Secondary | ICD-10-CM | POA: Diagnosis not present

## 2015-10-12 DIAGNOSIS — I10 Essential (primary) hypertension: Secondary | ICD-10-CM | POA: Diagnosis not present

## 2015-10-12 DIAGNOSIS — Z7902 Long term (current) use of antithrombotics/antiplatelets: Secondary | ICD-10-CM | POA: Diagnosis not present

## 2015-10-13 ENCOUNTER — Telehealth: Payer: Self-pay | Admitting: Family Medicine

## 2015-10-13 DIAGNOSIS — D509 Iron deficiency anemia, unspecified: Secondary | ICD-10-CM | POA: Diagnosis not present

## 2015-10-13 DIAGNOSIS — G309 Alzheimer's disease, unspecified: Secondary | ICD-10-CM | POA: Diagnosis not present

## 2015-10-13 DIAGNOSIS — Z7902 Long term (current) use of antithrombotics/antiplatelets: Secondary | ICD-10-CM | POA: Diagnosis not present

## 2015-10-13 DIAGNOSIS — Z8673 Personal history of transient ischemic attack (TIA), and cerebral infarction without residual deficits: Secondary | ICD-10-CM | POA: Diagnosis not present

## 2015-10-13 DIAGNOSIS — I10 Essential (primary) hypertension: Secondary | ICD-10-CM | POA: Diagnosis not present

## 2015-10-13 DIAGNOSIS — R2689 Other abnormalities of gait and mobility: Secondary | ICD-10-CM | POA: Diagnosis not present

## 2015-10-13 DIAGNOSIS — M6281 Muscle weakness (generalized): Secondary | ICD-10-CM | POA: Diagnosis not present

## 2015-10-13 DIAGNOSIS — E119 Type 2 diabetes mellitus without complications: Secondary | ICD-10-CM | POA: Diagnosis not present

## 2015-10-13 DIAGNOSIS — E46 Unspecified protein-calorie malnutrition: Secondary | ICD-10-CM | POA: Diagnosis not present

## 2015-10-13 DIAGNOSIS — Z7984 Long term (current) use of oral hypoglycemic drugs: Secondary | ICD-10-CM | POA: Diagnosis not present

## 2015-10-13 NOTE — Telephone Encounter (Signed)
Yes. And she was getting it in the hospital and the nursing home. She should be taking it.

## 2015-10-13 NOTE — Telephone Encounter (Signed)
Called pts husband but didn't get an answer so I called the home number. The patient answered and after stating my name and where i was calling from she hung up. Will call husbands cell at a later time.

## 2015-10-13 NOTE — Telephone Encounter (Signed)
Pts husband would like to know if his wife is still supposed to be taking plavix because so many medications have changed but it hasn't been picked up since November.

## 2015-10-13 NOTE — Telephone Encounter (Signed)
Husband notified  

## 2015-10-16 DIAGNOSIS — E46 Unspecified protein-calorie malnutrition: Secondary | ICD-10-CM | POA: Diagnosis not present

## 2015-10-16 DIAGNOSIS — G309 Alzheimer's disease, unspecified: Secondary | ICD-10-CM | POA: Diagnosis not present

## 2015-10-16 DIAGNOSIS — D509 Iron deficiency anemia, unspecified: Secondary | ICD-10-CM | POA: Diagnosis not present

## 2015-10-16 DIAGNOSIS — Z7902 Long term (current) use of antithrombotics/antiplatelets: Secondary | ICD-10-CM | POA: Diagnosis not present

## 2015-10-16 DIAGNOSIS — Z8673 Personal history of transient ischemic attack (TIA), and cerebral infarction without residual deficits: Secondary | ICD-10-CM | POA: Diagnosis not present

## 2015-10-16 DIAGNOSIS — R2689 Other abnormalities of gait and mobility: Secondary | ICD-10-CM | POA: Diagnosis not present

## 2015-10-16 DIAGNOSIS — I10 Essential (primary) hypertension: Secondary | ICD-10-CM | POA: Diagnosis not present

## 2015-10-16 DIAGNOSIS — M6281 Muscle weakness (generalized): Secondary | ICD-10-CM | POA: Diagnosis not present

## 2015-10-16 DIAGNOSIS — E119 Type 2 diabetes mellitus without complications: Secondary | ICD-10-CM | POA: Diagnosis not present

## 2015-10-16 DIAGNOSIS — Z7984 Long term (current) use of oral hypoglycemic drugs: Secondary | ICD-10-CM | POA: Diagnosis not present

## 2015-10-17 ENCOUNTER — Other Ambulatory Visit: Payer: Self-pay | Admitting: Family Medicine

## 2015-10-17 ENCOUNTER — Telehealth: Payer: Self-pay | Admitting: Family Medicine

## 2015-10-17 MED ORDER — METFORMIN HCL 500 MG PO TABS: 500.0000 mg | ORAL_TABLET | Freq: Two times a day (BID) | ORAL | Status: DC

## 2015-10-17 NOTE — Telephone Encounter (Signed)
Pt needs refill on metformin sent to [facility].

## 2015-10-18 DIAGNOSIS — G309 Alzheimer's disease, unspecified: Secondary | ICD-10-CM | POA: Diagnosis not present

## 2015-10-18 DIAGNOSIS — Z7902 Long term (current) use of antithrombotics/antiplatelets: Secondary | ICD-10-CM | POA: Diagnosis not present

## 2015-10-18 DIAGNOSIS — Z8673 Personal history of transient ischemic attack (TIA), and cerebral infarction without residual deficits: Secondary | ICD-10-CM | POA: Diagnosis not present

## 2015-10-18 DIAGNOSIS — I10 Essential (primary) hypertension: Secondary | ICD-10-CM | POA: Diagnosis not present

## 2015-10-18 DIAGNOSIS — E119 Type 2 diabetes mellitus without complications: Secondary | ICD-10-CM | POA: Diagnosis not present

## 2015-10-18 DIAGNOSIS — R2689 Other abnormalities of gait and mobility: Secondary | ICD-10-CM | POA: Diagnosis not present

## 2015-10-18 DIAGNOSIS — E46 Unspecified protein-calorie malnutrition: Secondary | ICD-10-CM | POA: Diagnosis not present

## 2015-10-18 DIAGNOSIS — Z7984 Long term (current) use of oral hypoglycemic drugs: Secondary | ICD-10-CM | POA: Diagnosis not present

## 2015-10-18 DIAGNOSIS — M6281 Muscle weakness (generalized): Secondary | ICD-10-CM | POA: Diagnosis not present

## 2015-10-18 DIAGNOSIS — D509 Iron deficiency anemia, unspecified: Secondary | ICD-10-CM | POA: Diagnosis not present

## 2015-10-18 NOTE — Telephone Encounter (Signed)
Lysbeth Galas called stated pt needs an order for a shower chair, with the pt's height, weight, and Dr. Durenda Age signature. Not an electronic order. Fax # [phone]. Thanks.    Fax should be Attn: Denton Ar

## 2015-10-19 NOTE — Telephone Encounter (Signed)
Done

## 2015-10-24 DIAGNOSIS — E46 Unspecified protein-calorie malnutrition: Secondary | ICD-10-CM | POA: Diagnosis not present

## 2015-10-24 DIAGNOSIS — E119 Type 2 diabetes mellitus without complications: Secondary | ICD-10-CM | POA: Diagnosis not present

## 2015-10-24 DIAGNOSIS — Z7902 Long term (current) use of antithrombotics/antiplatelets: Secondary | ICD-10-CM | POA: Diagnosis not present

## 2015-10-24 DIAGNOSIS — R2689 Other abnormalities of gait and mobility: Secondary | ICD-10-CM | POA: Diagnosis not present

## 2015-10-24 DIAGNOSIS — Z8673 Personal history of transient ischemic attack (TIA), and cerebral infarction without residual deficits: Secondary | ICD-10-CM | POA: Diagnosis not present

## 2015-10-24 DIAGNOSIS — Z7984 Long term (current) use of oral hypoglycemic drugs: Secondary | ICD-10-CM | POA: Diagnosis not present

## 2015-10-24 DIAGNOSIS — M6281 Muscle weakness (generalized): Secondary | ICD-10-CM | POA: Diagnosis not present

## 2015-10-24 DIAGNOSIS — I10 Essential (primary) hypertension: Secondary | ICD-10-CM | POA: Diagnosis not present

## 2015-10-24 DIAGNOSIS — G309 Alzheimer's disease, unspecified: Secondary | ICD-10-CM | POA: Diagnosis not present

## 2015-10-24 DIAGNOSIS — D509 Iron deficiency anemia, unspecified: Secondary | ICD-10-CM | POA: Diagnosis not present

## 2015-10-25 DIAGNOSIS — E46 Unspecified protein-calorie malnutrition: Secondary | ICD-10-CM | POA: Diagnosis not present

## 2015-10-25 DIAGNOSIS — Z8673 Personal history of transient ischemic attack (TIA), and cerebral infarction without residual deficits: Secondary | ICD-10-CM | POA: Diagnosis not present

## 2015-10-25 DIAGNOSIS — R2689 Other abnormalities of gait and mobility: Secondary | ICD-10-CM | POA: Diagnosis not present

## 2015-10-25 DIAGNOSIS — D509 Iron deficiency anemia, unspecified: Secondary | ICD-10-CM | POA: Diagnosis not present

## 2015-10-25 DIAGNOSIS — E119 Type 2 diabetes mellitus without complications: Secondary | ICD-10-CM | POA: Diagnosis not present

## 2015-10-25 DIAGNOSIS — Z7902 Long term (current) use of antithrombotics/antiplatelets: Secondary | ICD-10-CM | POA: Diagnosis not present

## 2015-10-25 DIAGNOSIS — I10 Essential (primary) hypertension: Secondary | ICD-10-CM | POA: Diagnosis not present

## 2015-10-25 DIAGNOSIS — G309 Alzheimer's disease, unspecified: Secondary | ICD-10-CM | POA: Diagnosis not present

## 2015-10-25 DIAGNOSIS — Z7984 Long term (current) use of oral hypoglycemic drugs: Secondary | ICD-10-CM | POA: Diagnosis not present

## 2015-10-25 DIAGNOSIS — M6281 Muscle weakness (generalized): Secondary | ICD-10-CM | POA: Diagnosis not present

## 2015-10-26 DIAGNOSIS — M6281 Muscle weakness (generalized): Secondary | ICD-10-CM | POA: Diagnosis not present

## 2015-10-26 DIAGNOSIS — D509 Iron deficiency anemia, unspecified: Secondary | ICD-10-CM | POA: Diagnosis not present

## 2015-10-26 DIAGNOSIS — G309 Alzheimer's disease, unspecified: Secondary | ICD-10-CM | POA: Diagnosis not present

## 2015-10-26 DIAGNOSIS — E46 Unspecified protein-calorie malnutrition: Secondary | ICD-10-CM | POA: Diagnosis not present

## 2015-10-26 DIAGNOSIS — Z7984 Long term (current) use of oral hypoglycemic drugs: Secondary | ICD-10-CM | POA: Diagnosis not present

## 2015-10-26 DIAGNOSIS — Z7902 Long term (current) use of antithrombotics/antiplatelets: Secondary | ICD-10-CM | POA: Diagnosis not present

## 2015-10-26 DIAGNOSIS — Z8673 Personal history of transient ischemic attack (TIA), and cerebral infarction without residual deficits: Secondary | ICD-10-CM | POA: Diagnosis not present

## 2015-10-26 DIAGNOSIS — E119 Type 2 diabetes mellitus without complications: Secondary | ICD-10-CM | POA: Diagnosis not present

## 2015-10-26 DIAGNOSIS — I10 Essential (primary) hypertension: Secondary | ICD-10-CM | POA: Diagnosis not present

## 2015-10-26 DIAGNOSIS — R2689 Other abnormalities of gait and mobility: Secondary | ICD-10-CM | POA: Diagnosis not present

## 2015-10-30 DIAGNOSIS — M6281 Muscle weakness (generalized): Secondary | ICD-10-CM | POA: Diagnosis not present

## 2015-10-30 DIAGNOSIS — E46 Unspecified protein-calorie malnutrition: Secondary | ICD-10-CM | POA: Diagnosis not present

## 2015-10-30 DIAGNOSIS — Z7902 Long term (current) use of antithrombotics/antiplatelets: Secondary | ICD-10-CM | POA: Diagnosis not present

## 2015-10-30 DIAGNOSIS — G309 Alzheimer's disease, unspecified: Secondary | ICD-10-CM | POA: Diagnosis not present

## 2015-10-30 DIAGNOSIS — I10 Essential (primary) hypertension: Secondary | ICD-10-CM | POA: Diagnosis not present

## 2015-10-30 DIAGNOSIS — Z8673 Personal history of transient ischemic attack (TIA), and cerebral infarction without residual deficits: Secondary | ICD-10-CM | POA: Diagnosis not present

## 2015-10-30 DIAGNOSIS — D509 Iron deficiency anemia, unspecified: Secondary | ICD-10-CM | POA: Diagnosis not present

## 2015-10-30 DIAGNOSIS — Z7984 Long term (current) use of oral hypoglycemic drugs: Secondary | ICD-10-CM | POA: Diagnosis not present

## 2015-10-30 DIAGNOSIS — E119 Type 2 diabetes mellitus without complications: Secondary | ICD-10-CM | POA: Diagnosis not present

## 2015-10-30 DIAGNOSIS — R2689 Other abnormalities of gait and mobility: Secondary | ICD-10-CM | POA: Diagnosis not present

## 2015-11-01 DIAGNOSIS — D509 Iron deficiency anemia, unspecified: Secondary | ICD-10-CM | POA: Diagnosis not present

## 2015-11-01 DIAGNOSIS — Z7902 Long term (current) use of antithrombotics/antiplatelets: Secondary | ICD-10-CM | POA: Diagnosis not present

## 2015-11-01 DIAGNOSIS — Z8673 Personal history of transient ischemic attack (TIA), and cerebral infarction without residual deficits: Secondary | ICD-10-CM | POA: Diagnosis not present

## 2015-11-01 DIAGNOSIS — R2689 Other abnormalities of gait and mobility: Secondary | ICD-10-CM | POA: Diagnosis not present

## 2015-11-01 DIAGNOSIS — I10 Essential (primary) hypertension: Secondary | ICD-10-CM | POA: Diagnosis not present

## 2015-11-01 DIAGNOSIS — M6281 Muscle weakness (generalized): Secondary | ICD-10-CM | POA: Diagnosis not present

## 2015-11-01 DIAGNOSIS — E119 Type 2 diabetes mellitus without complications: Secondary | ICD-10-CM | POA: Diagnosis not present

## 2015-11-01 DIAGNOSIS — Z7984 Long term (current) use of oral hypoglycemic drugs: Secondary | ICD-10-CM | POA: Diagnosis not present

## 2015-11-01 DIAGNOSIS — E46 Unspecified protein-calorie malnutrition: Secondary | ICD-10-CM | POA: Diagnosis not present

## 2015-11-01 DIAGNOSIS — G309 Alzheimer's disease, unspecified: Secondary | ICD-10-CM | POA: Diagnosis not present

## 2015-11-02 ENCOUNTER — Ambulatory Visit: Payer: Medicare Other | Admitting: Family Medicine

## 2015-11-02 DIAGNOSIS — G309 Alzheimer's disease, unspecified: Secondary | ICD-10-CM | POA: Diagnosis not present

## 2015-11-02 DIAGNOSIS — E46 Unspecified protein-calorie malnutrition: Secondary | ICD-10-CM | POA: Diagnosis not present

## 2015-11-02 DIAGNOSIS — R2689 Other abnormalities of gait and mobility: Secondary | ICD-10-CM | POA: Diagnosis not present

## 2015-11-02 DIAGNOSIS — Z8673 Personal history of transient ischemic attack (TIA), and cerebral infarction without residual deficits: Secondary | ICD-10-CM | POA: Diagnosis not present

## 2015-11-02 DIAGNOSIS — D509 Iron deficiency anemia, unspecified: Secondary | ICD-10-CM | POA: Diagnosis not present

## 2015-11-02 DIAGNOSIS — Z7984 Long term (current) use of oral hypoglycemic drugs: Secondary | ICD-10-CM | POA: Diagnosis not present

## 2015-11-02 DIAGNOSIS — Z7902 Long term (current) use of antithrombotics/antiplatelets: Secondary | ICD-10-CM | POA: Diagnosis not present

## 2015-11-02 DIAGNOSIS — I10 Essential (primary) hypertension: Secondary | ICD-10-CM | POA: Diagnosis not present

## 2015-11-02 DIAGNOSIS — E119 Type 2 diabetes mellitus without complications: Secondary | ICD-10-CM | POA: Diagnosis not present

## 2015-11-02 DIAGNOSIS — M6281 Muscle weakness (generalized): Secondary | ICD-10-CM | POA: Diagnosis not present

## 2015-11-03 ENCOUNTER — Encounter: Payer: Self-pay | Admitting: Family Medicine

## 2015-11-03 ENCOUNTER — Ambulatory Visit (INDEPENDENT_AMBULATORY_CARE_PROVIDER_SITE_OTHER): Payer: Medicare Other | Admitting: Family Medicine

## 2015-11-03 VITALS — BP 141/69 | HR 97 | Temp 97.5°F | Ht 60.0 in | Wt 105.0 lb

## 2015-11-03 DIAGNOSIS — F0150 Vascular dementia without behavioral disturbance: Secondary | ICD-10-CM

## 2015-11-03 DIAGNOSIS — F0280 Dementia in other diseases classified elsewhere without behavioral disturbance: Secondary | ICD-10-CM

## 2015-11-03 DIAGNOSIS — E46 Unspecified protein-calorie malnutrition: Secondary | ICD-10-CM

## 2015-11-03 DIAGNOSIS — IMO0002 Reserved for concepts with insufficient information to code with codable children: Secondary | ICD-10-CM

## 2015-11-03 DIAGNOSIS — G309 Alzheimer's disease, unspecified: Secondary | ICD-10-CM | POA: Diagnosis not present

## 2015-11-03 DIAGNOSIS — I69339 Monoplegia of upper limb following cerebral infarction affecting unspecified side: Secondary | ICD-10-CM | POA: Diagnosis not present

## 2015-11-03 NOTE — Assessment & Plan Note (Signed)
Significant concern about her and her husband's ability to care for her at home. Social worker, skilled nursing and PT/OT established with them, but husband said he didn't want them there. He does now. PT is back in, but the others aren't. Referral regenerated today. Discussed with patient and husband today, that if she continues not doing well, we may need to consider getting her into assisted living or a nursing home where they are able to take better care of her and make sure she eats. Information about elder care given to her husband today. Will stop remeron as she doesn't take it and it wasn't helping.

## 2015-11-03 NOTE — Assessment & Plan Note (Signed)
Patient does not admit that she has dementia. Husband is having a difficult time taking care of her. Will cut down on medications which she doesn't need. Continue to monitor. Continue to follow with neurology. Social work referral sent through today.

## 2015-11-03 NOTE — Assessment & Plan Note (Signed)
Seeing PT at home for now. Continue to monitor.

## 2015-11-03 NOTE — Progress Notes (Signed)
BP 141/69 mmHg  Pulse 97  Temp(Src) 97.5 F (36.4 C)  Ht 5' (1.524 m)  Wt 105 lb (47.628 kg)  BMI 20.51 kg/m2  LMP  (LMP Unknown)   Subjective:    Patient ID: Jasmine Arias, female    DOB: 1934/11/27, 81 y.o.   MRN: [id number]  HPI: Jasmine Arias is a 81 y.o. female  Chief Complaint  Patient presents with  . Weight Loss   WEIGHT LOSS Duration: slowly over months. Remeron didn't help at all. Husband can now not get her to eat, barely drinks the ensure. He is getting frustrated  Amount of weight loss: 5lbs in the last month Fevers: no Decreased appetite: yes Night sweats: no Dysphagia/odynophagia: no Chest pain: no Shortness of breath: no Cough: no Nausea: no Vomiting: no Abdominal pain: no Blood in stool: no Easy bruising/bleeding: no Jaundice: no Polydipsia/polyuria: no Depression: no  Relevant past medical, surgical, family and social history reviewed and updated as indicated. Interim medical history since our last visit reviewed. Allergies and medications reviewed and updated.  Review of Systems  Constitutional: Negative.   Respiratory: Negative.   Cardiovascular: Negative.   Gastrointestinal: Negative.   Psychiatric/Behavioral: Positive for behavioral problems, confusion and sleep disturbance. Negative for suicidal ideas, hallucinations, self-injury, dysphoric mood, decreased concentration and agitation. The patient is not nervous/anxious and is not hyperactive.     Per HPI unless specifically indicated above     Objective:    BP 141/69 mmHg  Pulse 97  Temp(Src) 97.5 F (36.4 C)  Ht 5' (1.524 m)  Wt 105 lb (47.628 kg)  BMI 20.51 kg/m2  LMP  (LMP Unknown)  Wt Readings from Last 3 Encounters:  11/03/15 105 lb (47.628 kg)  10/05/15 109 lb 9.6 oz (49.714 kg)  09/14/15 110 lb (49.896 kg)    Physical Exam  Constitutional: She is oriented to person, place, and time. She appears well-developed. No distress.  HENT:  Head: Normocephalic and  atraumatic.  Right Ear: Hearing normal.  Left Ear: Hearing normal.  Nose: Nose normal.  Eyes: Conjunctivae and lids are normal. Right eye exhibits no discharge. Left eye exhibits no discharge. No scleral icterus.  Cardiovascular: Normal rate, regular rhythm and intact distal pulses.  Exam reveals no gallop and no friction rub.   Murmur heard. Pulmonary/Chest: Effort normal and breath sounds normal. No respiratory distress. She has no wheezes. She has no rales. She exhibits no tenderness.  Musculoskeletal: Normal range of motion.  Neurological: She is alert and oriented to person, place, and time.  Skin: Skin is warm, dry and intact. No rash noted. No erythema. No pallor.  Psychiatric: She has a normal mood and affect. Her speech is normal and behavior is normal. Judgment and thought content normal. Cognition and memory are normal.  Nursing note and vitals reviewed.   Results for orders placed or performed in visit on 10/05/15  CBC with Differential/Platelet  Result Value Ref Range   WBC 4.0 3.4 - 10.8 x10E3/uL   RBC 4.28 3.77 - 5.28 x10E6/uL   Hemoglobin 11.5 11.1 - 15.9 g/dL   Hematocrit 35.2 34.0 - 46.6 %   MCV 82 79 - 97 fL   MCH 26.9 26.6 - 33.0 pg   MCHC 32.7 31.5 - 35.7 g/dL   RDW 16.7 (H) 12.3 - 15.4 %   Platelets 244 150 - 379 x10E3/uL   Neutrophils 71 %   Lymphs 20 %   Monocytes 7 %   Eos 1 %   Basos  1 %   Neutrophils Absolute 2.8 1.4 - 7.0 x10E3/uL   Lymphocytes Absolute 0.8 0.7 - 3.1 x10E3/uL   Monocytes Absolute 0.3 0.1 - 0.9 x10E3/uL   EOS (ABSOLUTE) 0.0 0.0 - 0.4 x10E3/uL   Basophils Absolute 0.0 0.0 - 0.2 x10E3/uL   Immature Granulocytes 0 %   Immature Grans (Abs) 0.0 0.0 - 0.1 x10E3/uL      Assessment & Plan:   Problem List Items Addressed This Visit      Nervous and Auditory   Mixed Alzheimer's and vascular dementia - Primary    Patient does not admit that she has dementia. Husband is having a difficult time taking care of her. Will cut down on  medications which she doesn't need. Continue to monitor. Continue to follow with neurology. Social work referral sent through today.      CVA, old, monoplegia upper limb ([place])    Seeing PT at home for now. Continue to monitor.         Other   Malnutrition ([place])    Significant concern about her and her husband's ability to care for her at home. Social worker, skilled nursing and PT/OT established with them, but husband said he didn't want them there. He does now. PT is back in, but the others aren't. Referral regenerated today. Discussed with patient and husband today, that if she continues not doing well, we may need to consider getting her into assisted living or a nursing home where they are able to take better care of her and make sure she eats. Information about elder care given to her husband today. Will stop remeron as she doesn't take it and it wasn't helping.            Follow up plan: Return in about 4 weeks (around 12/01/2015).

## 2015-11-09 DIAGNOSIS — R2689 Other abnormalities of gait and mobility: Secondary | ICD-10-CM | POA: Diagnosis not present

## 2015-11-09 DIAGNOSIS — E119 Type 2 diabetes mellitus without complications: Secondary | ICD-10-CM | POA: Diagnosis not present

## 2015-11-09 DIAGNOSIS — D509 Iron deficiency anemia, unspecified: Secondary | ICD-10-CM | POA: Diagnosis not present

## 2015-11-09 DIAGNOSIS — Z8673 Personal history of transient ischemic attack (TIA), and cerebral infarction without residual deficits: Secondary | ICD-10-CM | POA: Diagnosis not present

## 2015-11-09 DIAGNOSIS — Z7984 Long term (current) use of oral hypoglycemic drugs: Secondary | ICD-10-CM | POA: Diagnosis not present

## 2015-11-09 DIAGNOSIS — M6281 Muscle weakness (generalized): Secondary | ICD-10-CM | POA: Diagnosis not present

## 2015-11-09 DIAGNOSIS — G309 Alzheimer's disease, unspecified: Secondary | ICD-10-CM | POA: Diagnosis not present

## 2015-11-09 DIAGNOSIS — Z7902 Long term (current) use of antithrombotics/antiplatelets: Secondary | ICD-10-CM | POA: Diagnosis not present

## 2015-11-09 DIAGNOSIS — I10 Essential (primary) hypertension: Secondary | ICD-10-CM | POA: Diagnosis not present

## 2015-11-09 DIAGNOSIS — E46 Unspecified protein-calorie malnutrition: Secondary | ICD-10-CM | POA: Diagnosis not present

## 2015-11-15 DIAGNOSIS — E119 Type 2 diabetes mellitus without complications: Secondary | ICD-10-CM | POA: Diagnosis not present

## 2015-11-15 DIAGNOSIS — Z7984 Long term (current) use of oral hypoglycemic drugs: Secondary | ICD-10-CM | POA: Diagnosis not present

## 2015-11-15 DIAGNOSIS — E46 Unspecified protein-calorie malnutrition: Secondary | ICD-10-CM | POA: Diagnosis not present

## 2015-11-15 DIAGNOSIS — M6281 Muscle weakness (generalized): Secondary | ICD-10-CM | POA: Diagnosis not present

## 2015-11-15 DIAGNOSIS — G309 Alzheimer's disease, unspecified: Secondary | ICD-10-CM | POA: Diagnosis not present

## 2015-11-15 DIAGNOSIS — D509 Iron deficiency anemia, unspecified: Secondary | ICD-10-CM | POA: Diagnosis not present

## 2015-11-15 DIAGNOSIS — R2689 Other abnormalities of gait and mobility: Secondary | ICD-10-CM | POA: Diagnosis not present

## 2015-11-15 DIAGNOSIS — Z8673 Personal history of transient ischemic attack (TIA), and cerebral infarction without residual deficits: Secondary | ICD-10-CM | POA: Diagnosis not present

## 2015-11-15 DIAGNOSIS — I10 Essential (primary) hypertension: Secondary | ICD-10-CM | POA: Diagnosis not present

## 2015-11-15 DIAGNOSIS — Z7902 Long term (current) use of antithrombotics/antiplatelets: Secondary | ICD-10-CM | POA: Diagnosis not present

## 2015-11-22 DIAGNOSIS — Z7902 Long term (current) use of antithrombotics/antiplatelets: Secondary | ICD-10-CM | POA: Diagnosis not present

## 2015-11-22 DIAGNOSIS — Z8673 Personal history of transient ischemic attack (TIA), and cerebral infarction without residual deficits: Secondary | ICD-10-CM | POA: Diagnosis not present

## 2015-11-22 DIAGNOSIS — M6281 Muscle weakness (generalized): Secondary | ICD-10-CM | POA: Diagnosis not present

## 2015-11-22 DIAGNOSIS — G309 Alzheimer's disease, unspecified: Secondary | ICD-10-CM | POA: Diagnosis not present

## 2015-11-22 DIAGNOSIS — I10 Essential (primary) hypertension: Secondary | ICD-10-CM | POA: Diagnosis not present

## 2015-11-22 DIAGNOSIS — Z7984 Long term (current) use of oral hypoglycemic drugs: Secondary | ICD-10-CM | POA: Diagnosis not present

## 2015-11-22 DIAGNOSIS — R2689 Other abnormalities of gait and mobility: Secondary | ICD-10-CM | POA: Diagnosis not present

## 2015-11-22 DIAGNOSIS — E119 Type 2 diabetes mellitus without complications: Secondary | ICD-10-CM | POA: Diagnosis not present

## 2015-11-22 DIAGNOSIS — D509 Iron deficiency anemia, unspecified: Secondary | ICD-10-CM | POA: Diagnosis not present

## 2015-11-22 DIAGNOSIS — E46 Unspecified protein-calorie malnutrition: Secondary | ICD-10-CM | POA: Diagnosis not present

## 2015-11-29 DIAGNOSIS — G309 Alzheimer's disease, unspecified: Secondary | ICD-10-CM | POA: Diagnosis not present

## 2015-11-29 DIAGNOSIS — I10 Essential (primary) hypertension: Secondary | ICD-10-CM | POA: Diagnosis not present

## 2015-11-29 DIAGNOSIS — Z7902 Long term (current) use of antithrombotics/antiplatelets: Secondary | ICD-10-CM | POA: Diagnosis not present

## 2015-11-29 DIAGNOSIS — Z7984 Long term (current) use of oral hypoglycemic drugs: Secondary | ICD-10-CM | POA: Diagnosis not present

## 2015-11-29 DIAGNOSIS — E46 Unspecified protein-calorie malnutrition: Secondary | ICD-10-CM | POA: Diagnosis not present

## 2015-11-29 DIAGNOSIS — D509 Iron deficiency anemia, unspecified: Secondary | ICD-10-CM | POA: Diagnosis not present

## 2015-11-29 DIAGNOSIS — E119 Type 2 diabetes mellitus without complications: Secondary | ICD-10-CM | POA: Diagnosis not present

## 2015-11-29 DIAGNOSIS — R2689 Other abnormalities of gait and mobility: Secondary | ICD-10-CM | POA: Diagnosis not present

## 2015-11-29 DIAGNOSIS — M6281 Muscle weakness (generalized): Secondary | ICD-10-CM | POA: Diagnosis not present

## 2015-11-29 DIAGNOSIS — Z8673 Personal history of transient ischemic attack (TIA), and cerebral infarction without residual deficits: Secondary | ICD-10-CM | POA: Diagnosis not present

## 2015-12-01 ENCOUNTER — Ambulatory Visit (INDEPENDENT_AMBULATORY_CARE_PROVIDER_SITE_OTHER): Payer: Medicare Other | Admitting: Family Medicine

## 2015-12-01 ENCOUNTER — Encounter: Payer: Self-pay | Admitting: Family Medicine

## 2015-12-01 VITALS — BP 124/60 | HR 101 | Temp 98.2°F | Ht 59.4 in | Wt 104.0 lb

## 2015-12-01 DIAGNOSIS — G309 Alzheimer's disease, unspecified: Secondary | ICD-10-CM | POA: Diagnosis not present

## 2015-12-01 DIAGNOSIS — F0280 Dementia in other diseases classified elsewhere without behavioral disturbance: Secondary | ICD-10-CM

## 2015-12-01 DIAGNOSIS — F0150 Vascular dementia without behavioral disturbance: Secondary | ICD-10-CM | POA: Diagnosis not present

## 2015-12-01 MED ORDER — METOPROLOL TARTRATE 25 MG PO TABS: 12.5000 mg | ORAL_TABLET | Freq: Two times a day (BID) | ORAL | Status: DC

## 2015-12-01 MED ORDER — DONEPEZIL HCL 10 MG PO TABS: 10.0000 mg | ORAL_TABLET | Freq: Every day | ORAL | Status: DC

## 2015-12-01 MED ORDER — CLOPIDOGREL BISULFATE 75 MG PO TABS: 75.0000 mg | ORAL_TABLET | Freq: Every day | ORAL | Status: DC

## 2015-12-01 MED ORDER — MEMANTINE HCL 5 MG PO TABS: 5.0000 mg | ORAL_TABLET | Freq: Two times a day (BID) | ORAL | Status: DC

## 2015-12-01 NOTE — Progress Notes (Signed)
BP 124/60 mmHg  Pulse 101  Temp(Src) 98.2 F (36.8 C)  Ht 4' 11.4" (1.509 m)  Wt 104 lb (47.174 kg)  BMI 20.72 kg/m2  SpO2 98%  LMP  (LMP Unknown)   Subjective:    Patient ID: Jasmine Arias, female    DOB: 1934/10/09, 81 y.o.   MRN: [id number]  HPI: Jasmine Arias is a 81 y.o. female  Chief Complaint  Patient presents with  . Memory Loss   Here today to follow up on memory loss. Still not eating. Still having trouble at home. Husband notes that a nurse did come out for a couple of weeks, but then they stopped coming. He is having a very difficult time caring for her and cannot get her to eat. He is feeling very frustrated.   Relevant past medical, surgical, family and social history reviewed and updated as indicated. Interim medical history since our last visit reviewed. Allergies and medications reviewed and updated.  Review of Systems  Constitutional: Negative.   HENT: Negative.   Respiratory: Positive for cough. Negative for apnea, choking, chest tightness, shortness of breath, wheezing and stridor.   Cardiovascular: Negative.   Psychiatric/Behavioral: Positive for behavioral problems and confusion. Negative for suicidal ideas, hallucinations, sleep disturbance, self-injury, dysphoric mood, decreased concentration and agitation. The patient is not nervous/anxious and is not hyperactive.     Per HPI unless specifically indicated above     Objective:    BP 124/60 mmHg  Pulse 101  Temp(Src) 98.2 F (36.8 C)  Ht 4' 11.4" (1.509 m)  Wt 104 lb (47.174 kg)  BMI 20.72 kg/m2  SpO2 98%  LMP  (LMP Unknown)  Wt Readings from Last 3 Encounters:  12/01/15 104 lb (47.174 kg)  11/03/15 105 lb (47.628 kg)  10/05/15 109 lb 9.6 oz (49.714 kg)    Physical Exam  Constitutional: She is oriented to person, place, and time. She appears well-developed and well-nourished. No distress.  HENT:  Head: Normocephalic and atraumatic.  Right Ear: Hearing and external ear normal.  Left  Ear: Hearing and external ear normal.  Nose: Nose normal.  Mouth/Throat: Oropharynx is clear and moist. No oropharyngeal exudate.  Eyes: Conjunctivae, EOM and lids are normal. Pupils are equal, round, and reactive to light. Right eye exhibits no discharge. Left eye exhibits no discharge. No scleral icterus.  Neck: Normal range of motion. Neck supple. No JVD present. No tracheal deviation present. No thyromegaly present.  Cardiovascular: Normal rate, regular rhythm and intact distal pulses.  Exam reveals no gallop and no friction rub.   Murmur heard. Pulmonary/Chest: Effort normal and breath sounds normal. No stridor. No respiratory distress. She has no wheezes. She has no rales. She exhibits no tenderness.  Musculoskeletal: Normal range of motion.  Lymphadenopathy:    She has no cervical adenopathy.  Neurological: She is alert and oriented to person, place, and time.  Skin: Skin is warm, dry and intact. No rash noted. She is not diaphoretic. No erythema. No pallor.  Psychiatric: She has a normal mood and affect. Her speech is normal and behavior is normal. Judgment and thought content normal. Cognition and memory are normal.  Nursing note and vitals reviewed.   Results for orders placed or performed in visit on 10/05/15  CBC with Differential/Platelet  Result Value Ref Range   WBC 4.0 3.4 - 10.8 x10E3/uL   RBC 4.28 3.77 - 5.28 x10E6/uL   Hemoglobin 11.5 11.1 - 15.9 g/dL   Hematocrit 35.2 34.0 - 46.6 %  MCV 82 79 - 97 fL   MCH 26.9 26.6 - 33.0 pg   MCHC 32.7 31.5 - 35.7 g/dL   RDW 16.7 (H) 12.3 - 15.4 %   Platelets 244 150 - 379 x10E3/uL   Neutrophils 71 %   Lymphs 20 %   Monocytes 7 %   Eos 1 %   Basos 1 %   Neutrophils Absolute 2.8 1.4 - 7.0 x10E3/uL   Lymphocytes Absolute 0.8 0.7 - 3.1 x10E3/uL   Monocytes Absolute 0.3 0.1 - 0.9 x10E3/uL   EOS (ABSOLUTE) 0.0 0.0 - 0.4 x10E3/uL   Basophils Absolute 0.0 0.0 - 0.2 x10E3/uL   Immature Granulocytes 0 %   Immature Grans (Abs) 0.0  0.0 - 0.1 x10E3/uL      Assessment & Plan:   Problem List Items Addressed This Visit      Nervous and Auditory   Mixed Alzheimer's and vascular dementia - Primary    Patient does not admit that she has dementia. Husband is having a difficult time taking care of her. Will cut down on medications which she doesn't need. Continue to monitor. Continue to follow with neurology. Social work has seen her, but we don't have their notes. They are sending them over for [place] to review. Will have them return in 1 month with more information. Starting namenda for memory as well. Continue to monitor closely, if not significantly better next visit, may have to consider APS referral.       Relevant Medications   donepezil (ARICEPT) 10 MG tablet   memantine (NAMENDA) 5 MG tablet       Follow up plan: Return in about 4 weeks (around 12/29/2015) for follow up memory.

## 2015-12-01 NOTE — Assessment & Plan Note (Signed)
Patient does not admit that she has dementia. Husband is having a difficult time taking care of her. Will cut down on medications which she doesn't need. Continue to monitor. Continue to follow with neurology. Social work has seen her, but we don't have their notes. They are sending them over for [place] to review. Will have them return in 1 month with more information. Starting namenda for memory as well. Continue to monitor closely, if not significantly better next visit, may have to consider APS referral.

## 2015-12-04 DIAGNOSIS — I639 Cerebral infarction, unspecified: Secondary | ICD-10-CM | POA: Diagnosis not present

## 2015-12-04 DIAGNOSIS — I6523 Occlusion and stenosis of bilateral carotid arteries: Secondary | ICD-10-CM | POA: Diagnosis not present

## 2015-12-04 DIAGNOSIS — E785 Hyperlipidemia, unspecified: Secondary | ICD-10-CM | POA: Diagnosis not present

## 2015-12-04 DIAGNOSIS — I10 Essential (primary) hypertension: Secondary | ICD-10-CM | POA: Diagnosis not present

## 2015-12-05 DIAGNOSIS — R2689 Other abnormalities of gait and mobility: Secondary | ICD-10-CM | POA: Diagnosis not present

## 2015-12-05 DIAGNOSIS — I10 Essential (primary) hypertension: Secondary | ICD-10-CM | POA: Diagnosis not present

## 2015-12-05 DIAGNOSIS — G309 Alzheimer's disease, unspecified: Secondary | ICD-10-CM | POA: Diagnosis not present

## 2015-12-05 DIAGNOSIS — D509 Iron deficiency anemia, unspecified: Secondary | ICD-10-CM | POA: Diagnosis not present

## 2015-12-05 DIAGNOSIS — E46 Unspecified protein-calorie malnutrition: Secondary | ICD-10-CM | POA: Diagnosis not present

## 2015-12-05 DIAGNOSIS — Z7902 Long term (current) use of antithrombotics/antiplatelets: Secondary | ICD-10-CM | POA: Diagnosis not present

## 2015-12-05 DIAGNOSIS — Z7984 Long term (current) use of oral hypoglycemic drugs: Secondary | ICD-10-CM | POA: Diagnosis not present

## 2015-12-05 DIAGNOSIS — M6281 Muscle weakness (generalized): Secondary | ICD-10-CM | POA: Diagnosis not present

## 2015-12-05 DIAGNOSIS — E119 Type 2 diabetes mellitus without complications: Secondary | ICD-10-CM | POA: Diagnosis not present

## 2015-12-05 DIAGNOSIS — Z8673 Personal history of transient ischemic attack (TIA), and cerebral infarction without residual deficits: Secondary | ICD-10-CM | POA: Diagnosis not present

## 2016-01-23 DIAGNOSIS — E113293 Type 2 diabetes mellitus with mild nonproliferative diabetic retinopathy without macular edema, bilateral: Secondary | ICD-10-CM | POA: Diagnosis not present

## 2016-01-23 LAB — HM DIABETES EYE EXAM

## 2016-02-27 ENCOUNTER — Ambulatory Visit (INDEPENDENT_AMBULATORY_CARE_PROVIDER_SITE_OTHER): Payer: Medicare Other | Admitting: Family Medicine

## 2016-02-27 ENCOUNTER — Encounter: Payer: Self-pay | Admitting: Family Medicine

## 2016-02-27 VITALS — BP 148/66 | HR 51 | Temp 98.5°F | Wt 103.0 lb

## 2016-02-27 DIAGNOSIS — J449 Chronic obstructive pulmonary disease, unspecified: Secondary | ICD-10-CM | POA: Diagnosis not present

## 2016-02-27 DIAGNOSIS — F0150 Vascular dementia without behavioral disturbance: Secondary | ICD-10-CM

## 2016-02-27 DIAGNOSIS — G309 Alzheimer's disease, unspecified: Secondary | ICD-10-CM | POA: Diagnosis not present

## 2016-02-27 DIAGNOSIS — E785 Hyperlipidemia, unspecified: Secondary | ICD-10-CM | POA: Diagnosis not present

## 2016-02-27 DIAGNOSIS — I10 Essential (primary) hypertension: Secondary | ICD-10-CM

## 2016-02-27 DIAGNOSIS — E113299 Type 2 diabetes mellitus with mild nonproliferative diabetic retinopathy without macular edema, unspecified eye: Secondary | ICD-10-CM

## 2016-02-27 DIAGNOSIS — D509 Iron deficiency anemia, unspecified: Secondary | ICD-10-CM

## 2016-02-27 DIAGNOSIS — R410 Disorientation, unspecified: Secondary | ICD-10-CM | POA: Diagnosis not present

## 2016-02-27 DIAGNOSIS — F0280 Dementia in other diseases classified elsewhere without behavioral disturbance: Secondary | ICD-10-CM | POA: Diagnosis not present

## 2016-02-27 DIAGNOSIS — Z13220 Encounter for screening for lipoid disorders: Secondary | ICD-10-CM | POA: Diagnosis not present

## 2016-02-27 DIAGNOSIS — IMO0002 Reserved for concepts with insufficient information to code with codable children: Secondary | ICD-10-CM

## 2016-02-27 DIAGNOSIS — I69339 Monoplegia of upper limb following cerebral infarction affecting unspecified side: Secondary | ICD-10-CM | POA: Diagnosis not present

## 2016-02-27 DIAGNOSIS — E119 Type 2 diabetes mellitus without complications: Secondary | ICD-10-CM | POA: Insufficient documentation

## 2016-02-27 LAB — UA/M W/RFLX CULTURE, ROUTINE
Bilirubin, UA: NEGATIVE
GLUCOSE, UA: NEGATIVE
Leukocytes, UA: NEGATIVE
NITRITE UA: NEGATIVE
RBC, UA: NEGATIVE
Specific Gravity, UA: 1.025 (ref 1.005–1.030)
UUROB: 0.2 mg/dL (ref 0.2–1.0)
pH, UA: 5 (ref 5.0–7.5)

## 2016-02-27 LAB — CBC WITH DIFFERENTIAL/PLATELET
HEMATOCRIT: 39.2 % (ref 34.0–46.6)
HEMOGLOBIN: 12.7 g/dL (ref 11.1–15.9)
Lymphocytes Absolute: 1.2 10*3/uL (ref 0.7–3.1)
Lymphs: 25 %
MCH: 28.2 pg (ref 26.6–33.0)
MCHC: 32.4 g/dL (ref 31.5–35.7)
MCV: 87 fL (ref 79–97)
MID (ABSOLUTE): 0.3 10*3/uL (ref 0.1–1.6)
MID: 6 %
Neutrophils Absolute: 3.2 10*3/uL (ref 1.4–7.0)
Neutrophils: 69 %
PLATELETS: 267 10*3/uL (ref 150–379)
RBC: 4.5 x10E6/uL (ref 3.77–5.28)
RDW: 16.8 % — ABNORMAL HIGH (ref 12.3–15.4)
WBC: 4.7 10*3/uL (ref 3.4–10.8)

## 2016-02-27 LAB — MICROSCOPIC EXAMINATION

## 2016-02-27 LAB — BAYER DCA HB A1C WAIVED: HB A1C (BAYER DCA - WAIVED): 6.2 % (ref <7.0)

## 2016-02-27 MED ORDER — UMECLIDINIUM-VILANTEROL 62.5-25 MCG/INH IN AEPB: 1.0000 | INHALATION_SPRAY | Freq: Every day | RESPIRATORY_TRACT | Status: DC

## 2016-02-27 MED ORDER — MEMANTINE HCL 5 MG PO TABS: 5.0000 mg | ORAL_TABLET | Freq: Two times a day (BID) | ORAL | Status: DC

## 2016-02-27 NOTE — Assessment & Plan Note (Signed)
Will start anoro. Patient taught how to use medicine in the office today. Recheck in 1 month.

## 2016-02-27 NOTE — Assessment & Plan Note (Signed)
Rechecking levels today. Off medicine due to dementia and trying to simplify regimen.  Continue to monitor.

## 2016-02-27 NOTE — Assessment & Plan Note (Signed)
A1c down to 6.2. Due to dementia, we are attempting to remove as many medicines as possible that are not necessary. Will stop metformin and recheck sugar in 1 month and A1c in 3 months.

## 2016-02-27 NOTE — Assessment & Plan Note (Signed)
CBC normal today. Continue to monitor.

## 2016-02-27 NOTE — Patient Instructions (Signed)
Stop the metformin.   Start the anoro inhaler

## 2016-02-27 NOTE — Progress Notes (Signed)
BP 148/66 mmHg  Pulse 51  Temp(Src) 98.5 F (36.9 C)  Wt 103 lb (46.72 kg)  SpO2 95%  LMP  (LMP Unknown)   Subjective:    Patient ID: Jasmine Arias, female    DOB: March 27, 1935, 81 y.o.   MRN: [id number]  HPI: Jasmine Arias is a 81 y.o. female  Chief Complaint  Patient presents with  . Memory Loss  . Cough  . Arm Pain    Right arm   Everything is doing about the same. Husband is not noticing any difference with the medicine for her memory. It keeps getting worse slowly. Doesn't seem like it has changed. Due to see Dr. Manuella Ghazi again in June.   DIABETES- A1c down to 6.2 Hypoglycemic episodes:no Polydipsia/polyuria: no Visual disturbance: no Chest pain: no Paresthesias: no Glucose Monitoring: no Taking Insulin?: no Blood Pressure Monitoring: not checking Retinal Examination: Up to Date Foot Exam: Up to Date Diabetic Education: Completed Pneumovax: Up to Date Influenza: Up to Date Aspirin: yes  COPD COPD status: uncontrolled Satisfied with current treatment?: no Oxygen use: no Dyspnea frequency: occasionally Cough frequency: often Rescue inhaler frequency: not using one   Limitation of activity: yes Productive cough: No Pneumovax: Up to Date Influenza: Up to Date   Arm pain from the stroke- has not changed. States that she did not do well with her PT. No other concerns.    Relevant past medical, surgical, family and social history reviewed and updated as indicated. Interim medical history since our last visit reviewed. Allergies and medications reviewed and updated.  Review of Systems  Constitutional: Negative.   Respiratory: Negative.   Cardiovascular: Negative.   Psychiatric/Behavioral: Negative.     Per HPI unless specifically indicated above     Objective:    BP 148/66 mmHg  Pulse 51  Temp(Src) 98.5 F (36.9 C)  Wt 103 lb (46.72 kg)  SpO2 95%  LMP  (LMP Unknown)  Wt Readings from Last 3 Encounters:  02/27/16 103 lb (46.72 kg)  12/01/15 104  lb (47.174 kg)  11/03/15 105 lb (47.628 kg)    Physical Exam  Constitutional: She is oriented to person, place, and time. She appears well-developed and well-nourished. No distress.  HENT:  Head: Normocephalic and atraumatic.  Right Ear: Hearing normal.  Left Ear: Hearing normal.  Nose: Nose normal.  Eyes: Conjunctivae and lids are normal. Right eye exhibits no discharge. Left eye exhibits no discharge. No scleral icterus.  Cardiovascular: Normal rate and intact distal pulses.  An irregularly irregular rhythm present. Exam reveals no gallop and no friction rub.   Murmur heard. Pulmonary/Chest: Effort normal and breath sounds normal. No respiratory distress. She has no wheezes. She has no rales. She exhibits no tenderness.  Musculoskeletal: Normal range of motion.  Neurological: She is alert and oriented to person, place, and time.  Skin: Skin is warm, dry and intact. No rash noted. She is not diaphoretic. No erythema. No pallor.  Psychiatric: She has a normal mood and affect. Her speech is normal and behavior is normal. Judgment and thought content normal. Cognition and memory are impaired.  .  Results for orders placed or performed in visit on 02/27/16  Microscopic Examination  Result Value Ref Range   WBC, UA 0-5 0 -  5 /hpf   RBC, UA 0-2 0 -  2 /hpf   Epithelial Cells (non renal) 0-10 0 - 10 /hpf   Mucus, UA Present Not Estab.   Bacteria, UA Few None seen/Few  CBC With Differential/Platelet  Result Value Ref Range   WBC 4.7 3.4 - 10.8 x10E3/uL   RBC 4.50 3.77 - 5.28 x10E6/uL   Hemoglobin 12.7 11.1 - 15.9 g/dL   Hematocrit 39.2 34.0 - 46.6 %   MCV 87 79 - 97 fL   MCH 28.2 26.6 - 33.0 pg   MCHC 32.4 31.5 - 35.7 g/dL   RDW 16.8 (H) 12.3 - 15.4 %   Platelets 267 150 - 379 x10E3/uL   Neutrophils 69 %   Lymphs 25 %   MID 6 %   Neutrophils Absolute 3.2 1.4 - 7.0 x10E3/uL   Lymphocytes Absolute 1.2 0.7 - 3.1 x10E3/uL   MID (Absolute) 0.3 0.1 - 1.6 X10E3/uL  UA/M w/rflx  Culture, Routine  Result Value Ref Range   Specific Gravity, UA 1.025 1.005 - 1.030   pH, UA 5.0 5.0 - 7.5   Color, UA Yellow Yellow   Appearance Ur Clear Clear   Leukocytes, UA Negative Negative   Protein, UA 2+ (A) Negative/Trace   Glucose, UA Negative Negative   Ketones, UA 1+ (A) Negative   RBC, UA Negative Negative   Bilirubin, UA Negative Negative   Urobilinogen, Ur 0.2 0.2 - 1.0 mg/dL   Nitrite, UA Negative Negative   Microscopic Examination See below:   Bayer DCA Hb A1c Waived  Result Value Ref Range   Bayer DCA Hb A1c Waived 6.2 <7.0 %      Assessment & Plan:   Problem List Items Addressed This Visit      Cardiovascular and Mediastinum   Essential hypertension   Relevant Orders   Comprehensive metabolic panel   TSH     Respiratory   COPD (chronic obstructive pulmonary disease) (HCC)    Will start anoro. Patient taught how to use medicine in the office today. Recheck in 1 month.       Relevant Medications   umeclidinium-vilanterol (ANORO ELLIPTA) 62.5-25 MCG/INH AEPB     Endocrine   Type 2 diabetes mellitus with background retinopathy (HCC)    A1c down to 6.2. Due to dementia, we are attempting to remove as many medicines as possible that are not necessary. Will stop metformin and recheck sugar in 1 month and A1c in 3 months.       Relevant Orders   Bayer DCA Hb A1c Waived   Microscopic Examination (Completed)   Bayer DCA Hb A1c Waived (Completed)     Nervous and Auditory   Mixed Alzheimer's and vascular dementia - Primary    Again discussed with husband and Shirah that this will not get better. Medicine is to slow progress, but will not stop it or prevent it. He states that he is doing OK. Still very frustrated. States that he can care for her.       Relevant Medications   memantine (NAMENDA) 5 MG tablet   Other Relevant Orders   Comprehensive metabolic panel   TSH   UA/M w/rflx Culture, Routine (Completed)   CVA, old, monoplegia upper limb (HCC)     Stable. Continue to monitor.         Other   HLD (hyperlipidemia)    Rechecking levels today. Off medicine due to dementia and trying to simplify regimen.  Continue to monitor.         Relevant Orders   Lipid Panel w/o Chol/HDL Ratio   Iron deficiency anemia    CBC normal today. Continue to monitor.       Relevant Orders  CBC With Differential/Platelet (Completed)       Follow up plan: Return in about 4 weeks (around 03/26/2016) for follow up on sugars and breathing.

## 2016-02-27 NOTE — Assessment & Plan Note (Signed)
Stable.       - Continue to monitor

## 2016-02-27 NOTE — Assessment & Plan Note (Signed)
Again discussed with husband and Terilynn that this will not get better. Medicine is to slow progress, but will not stop it or prevent it. He states that he is doing OK. Still very frustrated. States that he can care for her.

## 2016-02-28 ENCOUNTER — Encounter: Payer: Self-pay | Admitting: Family Medicine

## 2016-02-28 LAB — COMPREHENSIVE METABOLIC PANEL
ALBUMIN: 3.9 g/dL (ref 3.5–4.8)
ALK PHOS: 87 IU/L (ref 39–117)
ALT: 7 IU/L (ref 0–32)
AST: 13 IU/L (ref 0–40)
Albumin/Globulin Ratio: 1.3 (ref 1.2–2.2)
BILIRUBIN TOTAL: 0.2 mg/dL (ref 0.0–1.2)
BUN / CREAT RATIO: 10 — AB (ref 12–28)
BUN: 10 mg/dL (ref 8–27)
CHLORIDE: 101 mmol/L (ref 96–106)
CO2: 26 mmol/L (ref 18–29)
Calcium: 9.8 mg/dL (ref 8.7–10.3)
Creatinine, Ser: 0.99 mg/dL (ref 0.57–1.00)
GFR calc Af Amer: 63 mL/min/{1.73_m2} (ref >59)
GFR calc non Af Amer: 55 mL/min/{1.73_m2} — ABNORMAL LOW (ref >59)
GLOBULIN, TOTAL: 2.9 g/dL (ref 1.5–4.5)
GLUCOSE: 169 mg/dL — AB (ref 65–99)
Potassium: 4.1 mmol/L (ref 3.5–5.2)
SODIUM: 141 mmol/L (ref 134–144)
Total Protein: 6.8 g/dL (ref 6.0–8.5)

## 2016-02-28 LAB — LIPID PANEL W/O CHOL/HDL RATIO
CHOLESTEROL TOTAL: 213 mg/dL — AB (ref 100–199)
HDL: 62 mg/dL (ref >39)
LDL Calculated: 136 mg/dL — ABNORMAL HIGH (ref 0–99)
TRIGLYCERIDES: 75 mg/dL (ref 0–149)
VLDL Cholesterol Cal: 15 mg/dL (ref 5–40)

## 2016-02-28 LAB — TSH: TSH: 0.554 u[IU]/mL (ref 0.450–4.500)

## 2016-03-01 ENCOUNTER — Encounter: Payer: Self-pay | Admitting: Vascular Surgery

## 2016-03-22 ENCOUNTER — Telehealth: Payer: Self-pay | Admitting: Family Medicine

## 2016-03-22 NOTE — Telephone Encounter (Signed)
Pts husband came in and would like to make sure the pt should still be taking clopidogrel (PLAVIX) 75 MG tablet. If indeed she is to continue this medication she will need a refill sent to [facility].

## 2016-03-22 NOTE — Telephone Encounter (Signed)
Spoke with pharmacy, they will fill the prescription, notified patients husband that he can pick it up.

## 2016-03-22 NOTE — Telephone Encounter (Signed)
She does need to still be on that. She shouldn't need a refill- she should have one at the pharmacy that he just needs to call for. Can we check with [place] [facility] to make sure they have that Rx and if not call it in? Thanks!

## 2016-04-03 ENCOUNTER — Ambulatory Visit: Payer: TRICARE For Life (TFL) | Admitting: Family Medicine

## 2016-04-05 ENCOUNTER — Ambulatory Visit (INDEPENDENT_AMBULATORY_CARE_PROVIDER_SITE_OTHER): Payer: Medicare Other | Admitting: Family Medicine

## 2016-04-05 ENCOUNTER — Encounter: Payer: Self-pay | Admitting: Family Medicine

## 2016-04-05 VITALS — BP 138/70 | HR 72 | Temp 98.4°F | Ht 59.0 in | Wt 106.4 lb

## 2016-04-05 DIAGNOSIS — F0280 Dementia in other diseases classified elsewhere without behavioral disturbance: Secondary | ICD-10-CM | POA: Diagnosis not present

## 2016-04-05 DIAGNOSIS — R42 Dizziness and giddiness: Secondary | ICD-10-CM | POA: Diagnosis not present

## 2016-04-05 DIAGNOSIS — E46 Unspecified protein-calorie malnutrition: Secondary | ICD-10-CM | POA: Diagnosis not present

## 2016-04-05 DIAGNOSIS — F0150 Vascular dementia without behavioral disturbance: Secondary | ICD-10-CM

## 2016-04-05 DIAGNOSIS — E113299 Type 2 diabetes mellitus with mild nonproliferative diabetic retinopathy without macular edema, unspecified eye: Secondary | ICD-10-CM | POA: Diagnosis not present

## 2016-04-05 DIAGNOSIS — I10 Essential (primary) hypertension: Secondary | ICD-10-CM

## 2016-04-05 DIAGNOSIS — G309 Alzheimer's disease, unspecified: Secondary | ICD-10-CM

## 2016-04-05 DIAGNOSIS — F17200 Nicotine dependence, unspecified, uncomplicated: Secondary | ICD-10-CM | POA: Diagnosis not present

## 2016-04-05 DIAGNOSIS — J449 Chronic obstructive pulmonary disease, unspecified: Secondary | ICD-10-CM | POA: Diagnosis not present

## 2016-04-05 LAB — GLUCOSE HEMOCUE WAIVED: GLU HEMOCUE WAIVED: 129 mg/dL — AB (ref 65–99)

## 2016-04-05 NOTE — Assessment & Plan Note (Signed)
Stable on her current regimen. Lungs clear. Continue medication. Work on cutting back on smoking.

## 2016-04-05 NOTE — Assessment & Plan Note (Signed)
Weight increased slightly. We are pleased with this. Eating 2 meals a day now. Continue to monitor.

## 2016-04-05 NOTE — Assessment & Plan Note (Signed)
Sugar 129 today. Continue off metformin and recheck A1c in 2 months. Call with concerns.

## 2016-04-05 NOTE — Progress Notes (Signed)
BP 138/70 mmHg  Pulse 72  Temp(Src) 98.4 F (36.9 C)  Ht 4\' 11"  (1.499 m)  Wt 106 lb 6.4 oz (48.263 kg)  BMI 21.48 kg/m2  SpO2 100%  LMP  (LMP Unknown)   Subjective:    Patient ID: Jasmine Arias, female    DOB: 1935-03-31, 81 y.o.   MRN: [id number]  HPI: Jasmine Arias is a 81 y.o. female  Chief Complaint  Patient presents with  . Dementia  . Dizziness    Patient says she's dizzy.    COPD- rechecking after starting anoro last visit. Feeling fine with it. Using it appropriately. No concerns.  DM- have been decreasing medication due to dementia. A1c down to 6.2 last visit, Checking sugars again today. Sugar 129. Doing well. No concerns.  Cough is getting worse. Still smoking. Smoking about a pack a day. Forgets that she smokes them and wants another in about 15minutes. Not coughing anything up. No URI symptoms, just a smoker's cough.  Dementia is stable. Husband still able to take care of her. Would like to make her medication as simple as possible.  HTN- BP goes up when she comes here. No other symptoms.  Feeling dizzy- usually when she doesn't eat. Chronic with no changes. Husband notes that she doesn't complain of it much at home.  Relevant past medical, surgical, family and social history reviewed and updated as indicated. Interim medical history since our last visit reviewed. Allergies and medications reviewed and updated.  Review of Systems  HENT: Negative.   Respiratory: Positive for cough. Negative for apnea, choking, chest tightness, shortness of breath, wheezing and stridor.   Cardiovascular: Negative.   Neurological: Positive for dizziness, weakness and light-headedness. Negative for tremors, seizures, syncope, facial asymmetry, speech difficulty, numbness and headaches.  Psychiatric/Behavioral: Positive for confusion and decreased concentration. Negative for suicidal ideas, hallucinations, behavioral problems, sleep disturbance, self-injury, dysphoric mood and  agitation. The patient is not nervous/anxious and is not hyperactive.     Per HPI unless specifically indicated above     Objective:    BP 138/70 mmHg  Pulse 72  Temp(Src) 98.4 F (36.9 C)  Ht 4\' 11"  (1.499 m)  Wt 106 lb 6.4 oz (48.263 kg)  BMI 21.48 kg/m2  SpO2 100%  LMP  (LMP Unknown)  Wt Readings from Last 3 Encounters:  04/05/16 106 lb 6.4 oz (48.263 kg)  02/27/16 103 lb (46.72 kg)  12/01/15 104 lb (47.174 kg)    Physical Exam  Constitutional: She is oriented to person, place, and time. She appears well-developed and well-nourished. No distress.  HENT:  Head: Normocephalic and atraumatic.  Right Ear: Hearing normal.  Left Ear: Hearing normal.  Nose: Nose normal.  Eyes: Conjunctivae and lids are normal. Right eye exhibits no discharge. Left eye exhibits no discharge. No scleral icterus.  Cardiovascular: Normal rate, regular rhythm and intact distal pulses.  Exam reveals no gallop and no friction rub.   Murmur heard. Pulmonary/Chest: Effort normal and breath sounds normal. No respiratory distress. She has no wheezes. She has no rales. She exhibits no tenderness.  Musculoskeletal: Normal range of motion.  Neurological: She is alert and oriented to person, place, and time.  Skin: Skin is warm, dry and intact. No rash noted. She is not diaphoretic. No erythema. No pallor.  Psychiatric: Her speech is normal and behavior is normal. Judgment and thought content normal. Her affect is blunt. Cognition and memory are impaired.  Nursing note and vitals reviewed.   Results for orders  placed or performed in visit on 02/27/16  Microscopic Examination  Result Value Ref Range   WBC, UA 0-5 0 -  5 /hpf   RBC, UA 0-2 0 -  2 /hpf   Epithelial Cells (non renal) 0-10 0 - 10 /hpf   Mucus, UA Present Not Estab.   Bacteria, UA Few None seen/Few  CBC With Differential/Platelet  Result Value Ref Range   WBC 4.7 3.4 - 10.8 x10E3/uL   RBC 4.50 3.77 - 5.28 x10E6/uL   Hemoglobin 12.7 11.1 -  15.9 g/dL   Hematocrit 39.2 34.0 - 46.6 %   MCV 87 79 - 97 fL   MCH 28.2 26.6 - 33.0 pg   MCHC 32.4 31.5 - 35.7 g/dL   RDW 16.8 (H) 12.3 - 15.4 %   Platelets 267 150 - 379 x10E3/uL   Neutrophils 69 %   Lymphs 25 %   MID 6 %   Neutrophils Absolute 3.2 1.4 - 7.0 x10E3/uL   Lymphocytes Absolute 1.2 0.7 - 3.1 x10E3/uL   MID (Absolute) 0.3 0.1 - 1.6 X10E3/uL  Comprehensive metabolic panel  Result Value Ref Range   Glucose 169 (H) 65 - 99 mg/dL   BUN 10 8 - 27 mg/dL   Creatinine, Ser 0.99 0.57 - 1.00 mg/dL   GFR calc non Af Amer 55 (L) >59 mL/min/1.73   GFR calc Af Amer 63 >59 mL/min/1.73   BUN/Creatinine Ratio 10 (L) 12 - 28   Sodium 141 134 - 144 mmol/L   Potassium 4.1 3.5 - 5.2 mmol/L   Chloride 101 96 - 106 mmol/L   CO2 26 18 - 29 mmol/L   Calcium 9.8 8.7 - 10.3 mg/dL   Total Protein 6.8 6.0 - 8.5 g/dL   Albumin 3.9 3.5 - 4.8 g/dL   Globulin, Total 2.9 1.5 - 4.5 g/dL   Albumin/Globulin Ratio 1.3 1.2 - 2.2   Bilirubin Total 0.2 0.0 - 1.2 mg/dL   Alkaline Phosphatase 87 39 - 117 IU/L   AST 13 0 - 40 IU/L   ALT 7 0 - 32 IU/L  Lipid Panel w/o Chol/HDL Ratio  Result Value Ref Range   Cholesterol, Total 213 (H) 100 - 199 mg/dL   Triglycerides 75 0 - 149 mg/dL   HDL 62 >39 mg/dL   VLDL Cholesterol Cal 15 5 - 40 mg/dL   LDL Calculated 136 (H) 0 - 99 mg/dL  TSH  Result Value Ref Range   TSH 0.554 0.450 - 4.500 uIU/mL  UA/M w/rflx Culture, Routine  Result Value Ref Range   Specific Gravity, UA 1.025 1.005 - 1.030   pH, UA 5.0 5.0 - 7.5   Color, UA Yellow Yellow   Appearance Ur Clear Clear   Leukocytes, UA Negative Negative   Protein, UA 2+ (A) Negative/Trace   Glucose, UA Negative Negative   Ketones, UA 1+ (A) Negative   RBC, UA Negative Negative   Bilirubin, UA Negative Negative   Urobilinogen, Ur 0.2 0.2 - 1.0 mg/dL   Nitrite, UA Negative Negative   Microscopic Examination See below:   Bayer DCA Hb A1c Waived  Result Value Ref Range   Bayer DCA Hb A1c Waived 6.2  <7.0 %      Assessment & Plan:   Problem List Items Addressed This Visit      Cardiovascular and Mediastinum   Essential hypertension    Better on recheck. Continue metoprolol. Continue to monitor. Call with any concerns.         Respiratory  COPD (chronic obstructive pulmonary disease) (HCC)    Stable on her current regimen. Lungs clear. Continue medication. Work on cutting back on smoking.         Endocrine   Type 2 diabetes mellitus with background retinopathy ([place]) - Primary    Sugar 129 today. Continue off metformin and recheck A1c in 2 months. Call with concerns.       Relevant Orders   Glucose Hemocue Waived     Nervous and Auditory   Mixed Alzheimer's and vascular dementia    Stable. Will change her to namzeric next visit for ease of taking, but will wait until next visit when they are almost out of their current Rx and then collaborate with the pharmacy to make sure they have the right medicine.         Other   Malnutrition ([place])    Weight increased slightly. We are pleased with this. Eating 2 meals a day now. Continue to monitor.       Vertigo    Chronic and multifactorial. Continue to work on sitting, drinking some water and eating something when she gets dizzy      Compulsive tobacco user syndrome    Advised her to only buy 1 pack at a time and try to make it last 2-3 days.           Follow up plan: Return in about 4 weeks (around 05/03/2016).

## 2016-04-05 NOTE — Assessment & Plan Note (Signed)
Advised her to only buy 1 pack at a time and try to make it last 2-3 days.

## 2016-04-05 NOTE — Assessment & Plan Note (Signed)
Better on recheck. Continue metoprolol. Continue to monitor. Call with any concerns.

## 2016-04-05 NOTE — Assessment & Plan Note (Signed)
Stable. Will change her to namzeric next visit for ease of taking, but will wait until next visit when they are almost out of their current Rx and then collaborate with the pharmacy to make sure they have the right medicine.

## 2016-04-05 NOTE — Assessment & Plan Note (Signed)
Chronic and multifactorial. Continue to work on sitting, drinking some water and eating something when she gets dizzy

## 2016-04-22 ENCOUNTER — Telehealth: Payer: Self-pay | Admitting: Family Medicine

## 2016-04-22 NOTE — Telephone Encounter (Signed)
Pt needs refill for clopidogrel (PLAVIX) 75 MG tablet sent to [facility].

## 2016-04-22 NOTE — Telephone Encounter (Signed)
Spoke with Arby Barrette at [facility], filled Plavix 04/22/16 patient has refills

## 2016-04-22 NOTE — Telephone Encounter (Signed)
Pretty sure she has a Rx over at [facility]

## 2016-04-22 NOTE — Telephone Encounter (Signed)
Forward to provider

## 2016-05-06 ENCOUNTER — Ambulatory Visit: Payer: TRICARE For Life (TFL) | Admitting: Family Medicine

## 2016-05-14 ENCOUNTER — Inpatient Hospital Stay (HOSPITAL_COMMUNITY)
Admission: EM | Admit: 2016-05-14 | Discharge: 2016-05-19 | DRG: 065 | Disposition: A | Discharge status: Skilled Nursing Facility | Payer: Medicare Other | Attending: Internal Medicine | Admitting: Internal Medicine

## 2016-05-14 ENCOUNTER — Encounter (HOSPITAL_COMMUNITY): Payer: Self-pay | Admitting: Emergency Medicine

## 2016-05-14 ENCOUNTER — Emergency Department (HOSPITAL_COMMUNITY): Payer: Medicare Other

## 2016-05-14 DIAGNOSIS — G308 Other Alzheimer's disease: Secondary | ICD-10-CM | POA: Diagnosis not present

## 2016-05-14 DIAGNOSIS — I639 Cerebral infarction, unspecified: Principal | ICD-10-CM | POA: Diagnosis present

## 2016-05-14 DIAGNOSIS — F17200 Nicotine dependence, unspecified, uncomplicated: Secondary | ICD-10-CM | POA: Diagnosis not present

## 2016-05-14 DIAGNOSIS — R05 Cough: Secondary | ICD-10-CM | POA: Diagnosis not present

## 2016-05-14 DIAGNOSIS — Z9889 Other specified postprocedural states: Secondary | ICD-10-CM

## 2016-05-14 DIAGNOSIS — K119 Disease of salivary gland, unspecified: Secondary | ICD-10-CM | POA: Diagnosis present

## 2016-05-14 DIAGNOSIS — G459 Transient cerebral ischemic attack, unspecified: Secondary | ICD-10-CM

## 2016-05-14 DIAGNOSIS — Z8673 Personal history of transient ischemic attack (TIA), and cerebral infarction without residual deficits: Secondary | ICD-10-CM | POA: Diagnosis not present

## 2016-05-14 DIAGNOSIS — I6521 Occlusion and stenosis of right carotid artery: Secondary | ICD-10-CM | POA: Diagnosis present

## 2016-05-14 DIAGNOSIS — Z6821 Body mass index (BMI) 21.0-21.9, adult: Secondary | ICD-10-CM

## 2016-05-14 DIAGNOSIS — Z7902 Long term (current) use of antithrombotics/antiplatelets: Secondary | ICD-10-CM

## 2016-05-14 DIAGNOSIS — J449 Chronic obstructive pulmonary disease, unspecified: Secondary | ICD-10-CM | POA: Diagnosis not present

## 2016-05-14 DIAGNOSIS — E113299 Type 2 diabetes mellitus with mild nonproliferative diabetic retinopathy without macular edema, unspecified eye: Secondary | ICD-10-CM | POA: Diagnosis present

## 2016-05-14 DIAGNOSIS — E46 Unspecified protein-calorie malnutrition: Secondary | ICD-10-CM | POA: Diagnosis not present

## 2016-05-14 DIAGNOSIS — Z79899 Other long term (current) drug therapy: Secondary | ICD-10-CM

## 2016-05-14 DIAGNOSIS — I63422 Cerebral infarction due to embolism of left anterior cerebral artery: Secondary | ICD-10-CM

## 2016-05-14 DIAGNOSIS — F0280 Dementia in other diseases classified elsewhere without behavioral disturbance: Secondary | ICD-10-CM | POA: Diagnosis present

## 2016-05-14 DIAGNOSIS — I10 Essential (primary) hypertension: Secondary | ICD-10-CM | POA: Diagnosis present

## 2016-05-14 DIAGNOSIS — E11319 Type 2 diabetes mellitus with unspecified diabetic retinopathy without macular edema: Secondary | ICD-10-CM | POA: Diagnosis not present

## 2016-05-14 DIAGNOSIS — I679 Cerebrovascular disease, unspecified: Secondary | ICD-10-CM | POA: Diagnosis not present

## 2016-05-14 DIAGNOSIS — G309 Alzheimer's disease, unspecified: Secondary | ICD-10-CM

## 2016-05-14 DIAGNOSIS — E785 Hyperlipidemia, unspecified: Secondary | ICD-10-CM | POA: Diagnosis present

## 2016-05-14 DIAGNOSIS — R911 Solitary pulmonary nodule: Secondary | ICD-10-CM | POA: Diagnosis not present

## 2016-05-14 DIAGNOSIS — R29898 Other symptoms and signs involving the musculoskeletal system: Secondary | ICD-10-CM | POA: Diagnosis present

## 2016-05-14 DIAGNOSIS — F0150 Vascular dementia without behavioral disturbance: Secondary | ICD-10-CM | POA: Diagnosis present

## 2016-05-14 DIAGNOSIS — R531 Weakness: Secondary | ICD-10-CM | POA: Diagnosis not present

## 2016-05-14 DIAGNOSIS — IMO0002 Reserved for concepts with insufficient information to code with codable children: Secondary | ICD-10-CM

## 2016-05-14 DIAGNOSIS — R4781 Slurred speech: Secondary | ICD-10-CM | POA: Diagnosis not present

## 2016-05-14 DIAGNOSIS — I69331 Monoplegia of upper limb following cerebral infarction affecting right dominant side: Secondary | ICD-10-CM | POA: Diagnosis not present

## 2016-05-14 LAB — URINALYSIS, ROUTINE W REFLEX MICROSCOPIC
Bilirubin Urine: NEGATIVE
GLUCOSE, UA: NEGATIVE mg/dL
Hgb urine dipstick: NEGATIVE
Ketones, ur: NEGATIVE mg/dL
LEUKOCYTES UA: NEGATIVE
NITRITE: NEGATIVE
PH: 6 (ref 5.0–8.0)
Protein, ur: NEGATIVE mg/dL
SPECIFIC GRAVITY, URINE: 1.02 (ref 1.005–1.030)

## 2016-05-14 LAB — CBC WITH DIFFERENTIAL/PLATELET
Basophils Absolute: 0 10*3/uL (ref 0.0–0.1)
Basophils Relative: 0 %
EOS ABS: 0.1 10*3/uL (ref 0.0–0.7)
Eosinophils Relative: 2 %
HEMATOCRIT: 41.2 % (ref 36.0–46.0)
HEMOGLOBIN: 12.8 g/dL (ref 12.0–15.0)
LYMPHS ABS: 1.4 10*3/uL (ref 0.7–4.0)
LYMPHS PCT: 28 %
MCH: 27.7 pg (ref 26.0–34.0)
MCHC: 31.1 g/dL (ref 30.0–36.0)
MCV: 89.2 fL (ref 78.0–100.0)
MONOS PCT: 7 %
Monocytes Absolute: 0.4 10*3/uL (ref 0.1–1.0)
NEUTROS ABS: 3.3 10*3/uL (ref 1.7–7.7)
NEUTROS PCT: 63 %
Platelets: 190 10*3/uL (ref 150–400)
RBC: 4.62 MIL/uL (ref 3.87–5.11)
RDW: 15.2 % (ref 11.5–15.5)
WBC: 5.2 10*3/uL (ref 4.0–10.5)

## 2016-05-14 LAB — I-STAT TROPONIN, ED: Troponin i, poc: 0.03 ng/mL (ref 0.00–0.08)

## 2016-05-14 LAB — COMPREHENSIVE METABOLIC PANEL
ALT: 12 U/L — AB (ref 14–54)
AST: 15 U/L (ref 15–41)
Albumin: 3.3 g/dL — ABNORMAL LOW (ref 3.5–5.0)
Alkaline Phosphatase: 76 U/L (ref 38–126)
Anion gap: 6 (ref 5–15)
BUN: 11 mg/dL (ref 6–20)
CHLORIDE: 107 mmol/L (ref 101–111)
CO2: 26 mmol/L (ref 22–32)
CREATININE: 0.95 mg/dL (ref 0.44–1.00)
Calcium: 9 mg/dL (ref 8.9–10.3)
GFR calc non Af Amer: 56 mL/min — ABNORMAL LOW (ref >60)
Glucose, Bld: 79 mg/dL (ref 65–99)
POTASSIUM: 3.3 mmol/L — AB (ref 3.5–5.1)
SODIUM: 139 mmol/L (ref 135–145)
Total Bilirubin: 0.4 mg/dL (ref 0.3–1.2)
Total Protein: 6.2 g/dL — ABNORMAL LOW (ref 6.5–8.1)

## 2016-05-14 LAB — CBG MONITORING, ED: GLUCOSE-CAPILLARY: 86 mg/dL (ref 65–99)

## 2016-05-14 MED ORDER — IPRATROPIUM-ALBUTEROL 0.5-2.5 (3) MG/3ML IN SOLN
3.0000 mL | Freq: Once | RESPIRATORY_TRACT | Status: AC
  Administered 2016-05-14: 3 mL via RESPIRATORY_TRACT
  Filled 2016-05-14: qty 3

## 2016-05-14 NOTE — ED Notes (Signed)
CBG was 86

## 2016-05-14 NOTE — ED Notes (Signed)
Pt to xray

## 2016-05-14 NOTE — ED Provider Notes (Signed)
[facility] DEPT Provider Note   CSN: [id number] Arrival date & time: 05/14/16  1916     History   Chief Complaint Chief Complaint  Patient presents with  . Weakness  . Stroke Symptoms    HPI Jasmine Arias is a 81 y.o. female.  81 year old female with past medical history including dementia, COPD, type 2 diabetes mellitus, hypertension, CVA with right arm weakness who presents with right leg weakness. History obtained with the assistance of the patient's grandson. He reports that today at lunch time around 1 PM the patient's was able to ambulate normally. They later noted when she got up that she seemed to be dragging her right leg. Husband reported to EMS that he last saw her normal last night before bedtime around 1:30am. He noted around 9 AM when she woke up changes in her strength and speech. The patient and her grandson have not noticed any speech changes. She has a chronic cough but denies any change in it recently. She denies any chest pain or shortness of breath. No fever, vomiting, diarrhea, or recent illness. She denies any complaints currently.   The history is provided by the patient and a relative.  Weakness     Past Medical History:  Diagnosis Date  . Anemia   . Anxiety   . COPD (chronic obstructive pulmonary disease) ([place])   . Dementia   . Diabetes mellitus without complication ([place])   . Hyperlipidemia   . Hypertension   . Indigestion   . Memory changes   . Stroke (cerebrum) [facility]     Patient Active Problem List   Diagnosis Date Noted  . COPD (chronic obstructive pulmonary disease) ([place]) 02/27/2016  . Orthostatic hypotension 09/14/2015  . Iron deficiency anemia 09/14/2015  . 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 Atrial flutter ([place]) 09/08/2015  . Trigeminy 09/08/2015  . Syncope 09/08/2015  . Carotid stenosis, symptomatic, with infarction ([place]) 08/11/2015  . Mixed Alzheimer's and vascular dementia 07/10/2015  . B12 deficiency  07/10/2015  . Malnutrition ([place]) 05/11/2015  . Type 2 diabetes mellitus with background retinopathy ([place]) 05/11/2015  . Vertigo 05/11/2015  . HLD (hyperlipidemia) 05/11/2015  . Essential hypertension 05/11/2015  . Multiple thyroid nodules 05/11/2015  . Carotid stenosis 05/11/2015  . Aortic atherosclerosis ([place]) 05/11/2015  . Polycythemia 02/12/2014  . Hyperthyroidism, subclinical 02/12/2014  . Cardiac murmur 02/11/2014  . Carotid artery bruit 02/11/2014  . Fast heart beat 02/11/2014  . Lung mass 06/10/2012  . Compulsive tobacco user syndrome 06/09/2012    Past Surgical History:  Procedure Laterality Date  . ABDOMINAL HYSTERECTOMY     heavy bleeding  . CAROTID ARTERY - SUBCLAVIAN ARTERY BYPASS GRAFT    . ENDARTERECTOMY Left 08/11/2015   Procedure: ENDARTERECTOMY CAROTID;  Surgeon: Katha Cabal, MD;  Location: [facility] ORS;  Service: Vascular;  Laterality: Left;  . PERIPHERAL VASCULAR CATHETERIZATION Left 08/11/2015   Procedure: Carotid Angiography;  Surgeon: Katha Cabal, MD;  Location: [facility];  Service: Cardiovascular;  Laterality: Left;    OB History    No data available       Home Medications    Prior to Admission medications   Medication Sig Start Date End Date Taking? Authorizing Provider  clopidogrel (PLAVIX) 75 MG tablet Take 1 tablet (75 mg total) by mouth daily with breakfast. 12/01/15  Yes Megan P Johnson, DO  donepezil (ARICEPT) 10 MG tablet Take 1 tablet (10 mg total) by mouth at bedtime. 12/01/15  Yes Megan P  Johnson, DO  memantine (NAMENDA) 5 MG tablet Take 1 tablet (5 mg total) by mouth 2 (two) times daily. 02/27/16  Yes Megan P Johnson, DO  metoprolol tartrate (LOPRESSOR) 25 MG tablet Take 0.5 tablets (12.5 mg total) by mouth 2 (two) times daily. 12/01/15  Yes Megan P Johnson, DO  Nutritional Supplements (ENSURE NUTRITION SHAKE) LIQD Take 8 oz by mouth 2 (two) times daily. 05/11/15  Yes Megan P Johnson, DO  umeclidinium-vilanterol (ANORO ELLIPTA)  62.5-25 MCG/INH AEPB Inhale 1 puff into the lungs daily. 02/27/16  Yes Megan P Johnson, DO  acetaminophen (TYLENOL) 325 MG tablet Take 1-2 tablets (325-650 mg total) by mouth every 4 (four) hours as needed for mild pain (or temp >/= 101 F). 08/16/15   Katha Cabal, MD    Family History Family History  Problem Relation Age of Onset  . Hypertension Mother   . Hypertension Father   . Heart disease Sister   . Hypertension Brother     Social History Social History  Substance Use Topics  . Smoking status: Current Every Day Smoker    Packs/day: 0.75  . Smokeless tobacco: Never Used  . Alcohol use No     Allergies   Review of patient's allergies indicates no known allergies.   Review of Systems Review of Systems  Unable to perform ROS: Dementia  Neurological: Positive for weakness.     Physical Exam Updated Vital Signs BP 186/77   Pulse 69   Temp 98.2 F (36.8 C) (Oral)   Resp 20   Ht 5\' 1"  (1.549 m)   Wt 106 lb (48.1 kg)   LMP  (LMP Unknown)   SpO2 100%   BMI 20.03 kg/m   Physical Exam  Constitutional: She is oriented to person, place, and time. No distress.  Thin, frail elderly woman, Awake, alert  HENT:  Head: Normocephalic and atraumatic.  Eyes: Conjunctivae and EOM are normal. Pupils are equal, round, and reactive to light.  Neck: Neck supple.  Cardiovascular: Normal rate, regular rhythm and normal heart sounds.   No murmur heard. Pulmonary/Chest: Effort normal and breath sounds normal. No respiratory distress.  Abdominal: Soft. Bowel sounds are normal. She exhibits no distension.  Musculoskeletal: She exhibits no edema.  Neurological: She is alert and oriented to person, place, and time. She has normal reflexes. No cranial nerve deficit. She exhibits normal muscle tone.  Fluent speech, normal finger-to-nose testing on L,  no clonus 5/5 strength and normal sensation x LUE, BLE 3/5 strength and normal sensation RUE  Skin: Skin is warm and dry.    Psychiatric: She has a normal mood and affect.  Nursing note and vitals reviewed.    ED Treatments / Results  Labs (all labs ordered are listed, but only abnormal results are displayed) Labs Reviewed  COMPREHENSIVE METABOLIC PANEL - Abnormal; Notable for the following:       Result Value   Potassium 3.3 (*)    Total Protein 6.2 (*)    Albumin 3.3 (*)    ALT 12 (*)    GFR calc non Af Amer 56 (*)    All other components within normal limits  CBC WITH DIFFERENTIAL/PLATELET  URINALYSIS, ROUTINE W REFLEX MICROSCOPIC (NOT AT [facility])  CBG MONITORING, ED  Randolm Idol, ED    EKG  EKG Interpretation  Date/Time:  Tuesday May 14 2016 19:24:12 EDT Ventricular Rate:  80 PR Interval:    QRS Duration: 99 QT Interval:  412 QTC Calculation: 476 R Axis:  57 Text Interpretation:  Sinus rhythm Low voltage, extremity leads No significant change since last tracing Confirmed by Syana Degraffenreid MD, Kilan Banfill [phone]) on 05/14/2016 7:27:29 PM       Radiology Dg Chest 2 View  Result Date: 05/14/2016 CLINICAL DATA:  Two-day history of cough and right-sided weakness. EXAM: CHEST  2 VIEW COMPARISON:  09/08/2015 FINDINGS: Frontal film is markedly rotated. Lungs are hyperexpanded without focal airspace consolidation or overt pulmonary edema. No pleural effusion or pneumothorax evident. Cardiopericardial silhouette is at upper limits of normal for size. Vascular stent device noted in the region of the left thoracic inlet. The visualized bony structures of the thorax are intact. Telemetry leads overlie the chest. IMPRESSION: Emphysema without acute cardiopulmonary findings. Electronically Signed   By: Misty Stanley M.D.   On: 05/14/2016 21:39   Ct Head Wo Contrast  Result Date: 05/14/2016 CLINICAL DATA:  Right leg weakness. EXAM: CT HEAD WITHOUT CONTRAST TECHNIQUE: Contiguous axial images were obtained from the base of the skull through the vertex without intravenous contrast. COMPARISON:  MRI 08/12/2015  FINDINGS: Brain: Moderate atrophy. Chronic ischemic changes in the white matter. Chronic left MCA infarct over the convexity. Negative for acute infarct. Negative for hemorrhage or mass. Vascular: Mild atherosclerotic calcification in the carotid and vertebral arteries. Skull: Benign-appearing bony exostosis left frontal bone. No acute skull abnormality Sinuses/Orbits: Negative Other: Negative IMPRESSION: Atrophy and chronic ischemic changes.  No acute abnormality. Electronically Signed   By: Franchot Gallo M.D.   On: 05/14/2016 21:34    Procedures Procedures (including critical care time)  Medications Ordered in ED Medications  ipratropium-albuterol (DUONEB) 0.5-2.5 (3) MG/3ML nebulizer solution 3 mL (3 mLs Nebulization Given 05/14/16 2102)     Initial Impression / Assessment and Plan / ED Course  I have reviewed the triage vital signs and the nursing notes.  Pertinent labs & imaging results that were available during my care of the patient were reviewed by me and considered in my medical decision making (see chart for details).  Clinical Course   PT w/ h/o CVA and R arm weakness brought in by EMS due to family concerns of weakness, difficulty ambulating today. She was awake and alert, comfortable on exam. Vital signs notable for mild hypertension. She denied any complaints. She had right arm weakness which is her baseline, no obvious lower extremity asymmetric weakness. Obtained above lab work as well as chest x-ray and head CT.  Head CT negative for acute process. Lab work is unremarkable and showed no evidence of infection. I discussed the patient's symptoms with neurology, Dr. Shon Hale, who has agreed that sx may represent TIA and pt would benefit from TIA work up. I discussed admission with Dr. Loleta Books and pt admitted for further w/u.   Final Clinical Impressions(s) / ED Diagnoses   Final diagnoses:  None    New Prescriptions New Prescriptions   No medications on file     Sharlett Iles, MD 05/15/16 0000

## 2016-05-14 NOTE — ED Notes (Signed)
In and out cath not performed. Pt voided on her own.

## 2016-05-14 NOTE — ED Triage Notes (Signed)
Pt brought in from home via EMS.per EMS Pt was last seen normal by husband last night before bed at approx 0130 and when she woke up this morning at approx 0900 the husband noticed changes in strength and speech. Per the grandson at 65 she presented with R sided leg weakness and had trouble ambulating.

## 2016-05-15 ENCOUNTER — Observation Stay (HOSPITAL_COMMUNITY): Payer: Medicare Other

## 2016-05-15 ENCOUNTER — Encounter (HOSPITAL_COMMUNITY): Payer: Self-pay | Admitting: Family Medicine

## 2016-05-15 DIAGNOSIS — F0390 Unspecified dementia without behavioral disturbance: Secondary | ICD-10-CM

## 2016-05-15 DIAGNOSIS — I63422 Cerebral infarction due to embolism of left anterior cerebral artery: Secondary | ICD-10-CM | POA: Diagnosis not present

## 2016-05-15 DIAGNOSIS — I6523 Occlusion and stenosis of bilateral carotid arteries: Secondary | ICD-10-CM

## 2016-05-15 DIAGNOSIS — E785 Hyperlipidemia, unspecified: Secondary | ICD-10-CM

## 2016-05-15 DIAGNOSIS — E113299 Type 2 diabetes mellitus with mild nonproliferative diabetic retinopathy without macular edema, unspecified eye: Secondary | ICD-10-CM

## 2016-05-15 DIAGNOSIS — I10 Essential (primary) hypertension: Secondary | ICD-10-CM

## 2016-05-15 DIAGNOSIS — E46 Unspecified protein-calorie malnutrition: Secondary | ICD-10-CM | POA: Diagnosis not present

## 2016-05-15 DIAGNOSIS — Z9889 Other specified postprocedural states: Secondary | ICD-10-CM | POA: Diagnosis not present

## 2016-05-15 DIAGNOSIS — F17200 Nicotine dependence, unspecified, uncomplicated: Secondary | ICD-10-CM

## 2016-05-15 DIAGNOSIS — G309 Alzheimer's disease, unspecified: Secondary | ICD-10-CM

## 2016-05-15 DIAGNOSIS — J449 Chronic obstructive pulmonary disease, unspecified: Secondary | ICD-10-CM | POA: Diagnosis not present

## 2016-05-15 DIAGNOSIS — F0150 Vascular dementia without behavioral disturbance: Secondary | ICD-10-CM

## 2016-05-15 DIAGNOSIS — F0280 Dementia in other diseases classified elsewhere without behavioral disturbance: Secondary | ICD-10-CM

## 2016-05-15 DIAGNOSIS — I6521 Occlusion and stenosis of right carotid artery: Secondary | ICD-10-CM | POA: Diagnosis not present

## 2016-05-15 DIAGNOSIS — Z8673 Personal history of transient ischemic attack (TIA), and cerebral infarction without residual deficits: Secondary | ICD-10-CM | POA: Diagnosis not present

## 2016-05-15 DIAGNOSIS — R29898 Other symptoms and signs involving the musculoskeletal system: Secondary | ICD-10-CM

## 2016-05-15 DIAGNOSIS — I639 Cerebral infarction, unspecified: Secondary | ICD-10-CM | POA: Diagnosis not present

## 2016-05-15 LAB — GLUCOSE, CAPILLARY
Glucose-Capillary: 183 mg/dL — ABNORMAL HIGH (ref 65–99)
Glucose-Capillary: 165 mg/dL — ABNORMAL HIGH (ref 65–99)
Glucose-Capillary: 101 mg/dL — ABNORMAL HIGH (ref 65–99)
Glucose-Capillary: 91 mg/dL (ref 65–99)

## 2016-05-15 LAB — LIPID PANEL
CHOL/HDL RATIO: 3.9 ratio
CHOLESTEROL: 218 mg/dL — AB (ref 0–200)
HDL: 56 mg/dL (ref >40)
LDL CALC: 147 mg/dL — AB (ref 0–99)
TRIGLYCERIDES: 76 mg/dL (ref <150)
VLDL: 15 mg/dL (ref 0–40)

## 2016-05-15 MED ORDER — ENOXAPARIN SODIUM 30 MG/0.3ML ~~LOC~~ SOLN
30.0000 mg | SUBCUTANEOUS | Status: DC
  Administered 2016-05-15 – 2016-05-19 (×5): 30 mg via SUBCUTANEOUS
  Filled 2016-05-15 (×5): qty 0.3

## 2016-05-15 MED ORDER — MEMANTINE HCL 5 MG PO TABS
5.0000 mg | ORAL_TABLET | Freq: Two times a day (BID) | ORAL | Status: DC
  Administered 2016-05-15 – 2016-05-19 (×9): 5 mg via ORAL
  Filled 2016-05-15: qty 0.5
  Filled 2016-05-15 (×10): qty 1

## 2016-05-15 MED ORDER — UMECLIDINIUM-VILANTEROL 62.5-25 MCG/INH IN AEPB
1.0000 | INHALATION_SPRAY | Freq: Every day | RESPIRATORY_TRACT | Status: DC
Start: 2016-05-15 — End: 2016-05-15

## 2016-05-15 MED ORDER — ALBUTEROL SULFATE (2.5 MG/3ML) 0.083% IN NEBU: 2.5000 mg | INHALATION_SOLUTION | RESPIRATORY_TRACT | Status: DC | PRN

## 2016-05-15 MED ORDER — STROKE: EARLY STAGES OF RECOVERY BOOK
Freq: Once | Status: AC
  Administered 2016-05-15: 05:00:00
  Filled 2016-05-15: qty 1

## 2016-05-15 MED ORDER — ASPIRIN EC 325 MG PO TBEC
325.0000 mg | DELAYED_RELEASE_TABLET | Freq: Every day | ORAL | Status: DC
  Administered 2016-05-15 – 2016-05-19 (×5): 325 mg via ORAL
  Filled 2016-05-15 (×5): qty 1

## 2016-05-15 MED ORDER — METOPROLOL TARTRATE 12.5 MG HALF TABLET
12.5000 mg | ORAL_TABLET | Freq: Two times a day (BID) | ORAL | Status: DC
  Administered 2016-05-15 – 2016-05-19 (×8): 12.5 mg via ORAL
  Filled 2016-05-15 (×10): qty 1

## 2016-05-15 MED ORDER — CLOPIDOGREL BISULFATE 75 MG PO TABS
75.0000 mg | ORAL_TABLET | Freq: Every day | ORAL | Status: DC
  Administered 2016-05-15 – 2016-05-19 (×5): 75 mg via ORAL
  Filled 2016-05-15 (×5): qty 1

## 2016-05-15 MED ORDER — DONEPEZIL HCL 10 MG PO TABS
10.0000 mg | ORAL_TABLET | Freq: Every day | ORAL | Status: DC
  Administered 2016-05-15 – 2016-05-18 (×4): 10 mg via ORAL
  Filled 2016-05-15 (×2): qty 2
  Filled 2016-05-15: qty 1
  Filled 2016-05-15 (×2): qty 2

## 2016-05-15 MED ORDER — IOPAMIDOL (ISOVUE-370) INJECTION 76%
INTRAVENOUS | Status: AC
  Administered 2016-05-15: 50 mL
  Filled 2016-05-15: qty 100

## 2016-05-15 MED ORDER — ACETAMINOPHEN 325 MG PO TABS: 650.0000 mg | ORAL_TABLET | ORAL | Status: DC | PRN

## 2016-05-15 MED ORDER — UMECLIDINIUM BROMIDE 62.5 MCG/INH IN AEPB
1.0000 | INHALATION_SPRAY | Freq: Every day | RESPIRATORY_TRACT | Status: DC
  Administered 2016-05-16 – 2016-05-19 (×4): 1 via RESPIRATORY_TRACT
  Filled 2016-05-15 (×3): qty 7

## 2016-05-15 MED ORDER — ARFORMOTEROL TARTRATE 15 MCG/2ML IN NEBU
15.0000 ug | INHALATION_SOLUTION | Freq: Two times a day (BID) | RESPIRATORY_TRACT | Status: DC
  Administered 2016-05-15 – 2016-05-19 (×8): 15 ug via RESPIRATORY_TRACT
  Filled 2016-05-15 (×12): qty 2

## 2016-05-15 MED ORDER — ACETAMINOPHEN 650 MG RE SUPP: 650.0000 mg | RECTAL | Status: DC | PRN

## 2016-05-15 MED ORDER — ATORVASTATIN CALCIUM 40 MG PO TABS
40.0000 mg | ORAL_TABLET | Freq: Every day | ORAL | Status: DC
  Administered 2016-05-15 – 2016-05-18 (×4): 40 mg via ORAL
  Filled 2016-05-15 (×4): qty 1

## 2016-05-15 MED ORDER — ENSURE ENLIVE PO LIQD
237.0000 mL | Freq: Two times a day (BID) | ORAL | Status: DC
  Administered 2016-05-15 – 2016-05-19 (×9): 237 mL via ORAL
  Filled 2016-05-15 (×11): qty 237

## 2016-05-15 NOTE — Progress Notes (Signed)
Patient is in NSR. RN will continue to monitor.

## 2016-05-15 NOTE — Progress Notes (Signed)
Arrived from Ed. Alert and oriented to person, place. Denies any pain. Grandson at bedside. Safety measure in place.

## 2016-05-15 NOTE — Progress Notes (Signed)
Assisted pt to the bathroom. Pt squatted over toilet, did not sit down.

## 2016-05-15 NOTE — Evaluation (Signed)
Physical Therapy Evaluation Patient Details Name: Jasmine Arias MRN: [id number] DOB: 06/29/1935 Today's Date: 05/15/2016   History of Present Illness  Jasmine Arias is a 81 y.o. 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  Clinical Impression  Patient presents with decreased independence with mobility due to deficits listed in PT problem list.  She will benefit from skilled PT in the acute setting to allow return home with family support and follow up HHPT.     Follow Up Recommendations Home health PT    Equipment Recommendations  None recommended by PT    Recommendations for Other Services       Precautions / Restrictions Precautions Precautions: Fall      Mobility  Bed Mobility Overal bed mobility: Modified Independent                Transfers Overall transfer level: Needs assistance Equipment used: Rolling walker (2 wheeled) Transfers: Sit to/from Stand Sit to Stand: Min assist;Min guard         General transfer comment: for safety  Ambulation/Gait Ambulation/Gait assistance: Supervision;Min guard;Min assist Ambulation Distance (Feet): 160 Feet Assistive device: Rolling walker (2 wheeled) Gait Pattern/deviations: Shuffle;Step-through pattern;Drifts right/left     General Gait Details: assist for walker guidance, pulls R due to R hand weakness, improved with guidance and walked half distance without device with min A  Stairs            Wheelchair Mobility    Modified Rankin (Stroke Patients Only)       Balance Overall balance assessment: Needs assistance Sitting-balance support: Feet supported;No upper extremity supported Sitting balance-Leahy Scale: Good     Standing balance support: No upper extremity supported Standing balance-Leahy Scale: Fair Standing balance comment: can stand  without UE support, endorses weakness                             Pertinent Vitals/Pain Pain Assessment: No/denies pain    Home Living Family/patient expects to be discharged to:: Private residence Living Arrangements: Spouse/significant other Available Help at Discharge: Family Type of Home: House Home Access: Stairs to enter Entrance Stairs-Rails: None Entrance Stairs-Number of Steps: 2 Home Layout: One level Home Equipment: Environmental consultant - 2 wheels      Prior Function Level of Independence: Independent               Hand Dominance   Dominant Hand: Right    Extremity/Trunk Assessment   Upper Extremity Assessment: Defer to OT evaluation           Lower Extremity Assessment: Generalized weakness      Cervical / Trunk Assessment: Kyphotic  Communication   Communication: HOH  Cognition Arousal/Alertness: Awake/alert Behavior During Therapy: WFL for tasks assessed/performed Overall Cognitive Status:  (h/o dementia, appropriate for sessuib)       Memory: Decreased short-term memory              General Comments      Exercises        Assessment/Plan    PT Assessment Patient needs continued PT services  PT Diagnosis Abnormality of gait;Generalized weakness   PT Problem List Decreased strength;Decreased activity tolerance;Decreased balance;Decreased knowledge of use of DME;Decreased mobility;Decreased safety awareness  PT Treatment Interventions DME instruction;Functional mobility training;Stair training;Gait training;Therapeutic activities;Therapeutic exercise;Balance training;Patient/family education   PT  Goals (Current goals can be found in the Care Plan section) Acute Rehab PT Goals Patient Stated Goal: To return to home PT Goal Formulation: With patient Time For Goal Achievement: 05/22/16 Potential to Achieve Goals: Good    Frequency Min 3X/week   Barriers to discharge Decreased caregiver support sposue uses a cane himself per RN     Co-evaluation               End of Session Equipment Utilized During Treatment: Gait belt Activity Tolerance: Patient tolerated treatment well Patient left: in bed;with call bell/phone within reach;with bed alarm set Nurse Communication: Mobility status    Functional Assessment Tool Used: Clinical Judgement Functional Limitation: Mobility: Walking and moving around Mobility: Walking and Moving Around Current Status [id number]): At least 1 percent but less than 20 percent impaired, limited or restricted Mobility: Walking and Moving Around Goal Status [phone]): At least 1 percent but less than 20 percent impaired, limited or restricted    Time: 1339-1400 PT Time Calculation (min) (ACUTE ONLY): 21 min   Charges:   PT Evaluation $PT Eval Moderate Complexity: 1 Procedure     PT G Codes:   PT G-Codes **NOT FOR INPATIENT CLASS** Functional Assessment Tool Used: Clinical Judgement Functional Limitation: Mobility: Walking and moving around Mobility: Walking and Moving Around Current Status [id number]): At least 1 percent but less than 20 percent impaired, limited or restricted Mobility: Walking and Moving Around Goal Status [phone]): At least 1 percent but less than 20 percent impaired, limited or restricted    Reginia Naas 05/15/2016, 3:14 PM  Magda Kiel, [place] 05/15/2016

## 2016-05-15 NOTE — H&P (Signed)
History and Physical  Patient Name: Jasmine Arias     [id number]    DOB: 1934-12-30    DOA: 05/14/2016 PCP: Park Liter, DO   Patient coming from: Home     Chief Complaint: Right leg weakness  HPI: Jasmine Arias is a 81 y.o. 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  History is collected from the patient who has mild dementia and her grandson.  They report that this afternoon, her husband noticed she "couldn't walk" and so called EMS (the patient had no symptoms).  There was no associated change in her chronic right arm weakness, speech, facial asymmetry or confusion.  No LOC or weakness or seizure.  When EMS arrived, she appeared to be dragging her right leg while they helped her to the ambulance.    ED course: -Afebrile, heart rate 70s, resiprations fast but SpO2 100% on room air, blood pressure moderately elevated -Na 139, K 3.9, Cr 0.9 (baseline 0.8), WBC 5.2K, Hgb 12 -CT head unremarkable for age -CXR and ECG consistent with chronic lung disease -Her right leg weakness had resolved, but lasted at least an hour -She was evaluated by Neurology, who recommended TIA evaluation to be expedited and TRH were asked to admit  Evidently she had TIA symptoms last Spring, was evaluated and found to have 90% Right carotid stenosis and CEA was recommended.  She initially declined, but eventually chose to have surgery, but unfortunately, the post-op course was complicated by acute stroke from a right carotid partial dissection and thrombus causing emboli and RIGHT MCA territory infarcts, resulting in slurred speech and right arm and leg weakness.  Her right leg weakness and speech resolved, but her RIGHT arm weakness have been permanent.     Review of Systems:  Pt's family notes right leg weakness, inability to walk. Pt denies any LOC,  seizure, slurred speech, confusion.  Denies dysuria, urinary frequency, urine changes.  Denies change in cough, fever, sputum.  Denies diarrhea, decreased oral intake, orthostatic symptoms, medication changes other than decreasing her namzaric slightly and stopping metformin one month ago.  All other systems negative except as just noted or noted in the history of present illness.  Past Medical History:  Diagnosis Date  . Anemia   . Anxiety   . COPD (chronic obstructive pulmonary disease) ([place])   . Dementia   . Diabetes mellitus without complication ([place])   . Hyperlipidemia   . Hypertension   . Indigestion   . Memory changes   . Stroke (cerebrum) [facility])     Past Surgical History:  Procedure Laterality Date  . ABDOMINAL HYSTERECTOMY     heavy bleeding  . CAROTID ARTERY - SUBCLAVIAN ARTERY BYPASS GRAFT    . ENDARTERECTOMY Left 08/11/2015   Procedure: ENDARTERECTOMY CAROTID;  Surgeon: Katha Cabal, MD;  Location: [facility] ORS;  Service: Vascular;  Laterality: Left;  . PERIPHERAL VASCULAR CATHETERIZATION Left 08/11/2015   Procedure: Carotid Angiography;  Surgeon: Katha Cabal, MD;  Location: [place] CV LAB;  Service: Cardiovascular;  Laterality: Left;    Social History: Patient lives with her husband in [place].  She has mild to moderate dementia.  Patient walks unassisted at baseline, she reports, without a cane or walker.  She denies help with ADLs.  She smokes 0.75 packs per day.  Does not drink alcohol.  Used to work in [employer].  From [place] originally.    No Known Allergies  Family history: family history includes Heart disease in her sister; Hypertension in her brother, father, and mother.  Prior to Admission medications   Medication Sig Start Date End Date Taking? Authorizing Provider  clopidogrel (PLAVIX) 75 MG tablet Take 1 tablet (75 mg total) by mouth daily with breakfast. 12/01/15  Yes Megan P Johnson, DO  donepezil (ARICEPT) 10 MG tablet Take 1 tablet (10 mg  total) by mouth at bedtime. 12/01/15  Yes Megan P Johnson, DO  memantine (NAMENDA) 5 MG tablet Take 1 tablet (5 mg total) by mouth 2 (two) times daily. 02/27/16  Yes Megan P Johnson, DO  metoprolol tartrate (LOPRESSOR) 25 MG tablet Take 0.5 tablets (12.5 mg total) by mouth 2 (two) times daily. 12/01/15  Yes Megan P Johnson, DO  Nutritional Supplements (ENSURE NUTRITION SHAKE) LIQD Take 8 oz by mouth 2 (two) times daily. 05/11/15  Yes Megan P Johnson, DO  umeclidinium-vilanterol (ANORO ELLIPTA) 62.5-25 MCG/INH AEPB Inhale 1 puff into the lungs daily. 02/27/16  Yes Megan P Johnson, DO  acetaminophen (TYLENOL) 325 MG tablet Take 1-2 tablets (325-650 mg total) by mouth every 4 (four) hours as needed for mild pain (or temp >/= 101 F). 08/16/15   Katha Cabal, MD     Physical Exam: BP 186/77   Pulse 69   Temp 98.2 F (36.8 C) (Oral)   Resp 20   Ht 5\' 1"  (1.549 m)   Wt 48.1 kg (106 lb)   LMP  (LMP Unknown)   SpO2 100%   BMI 20.03 kg/m  General appearance: Thin frail appearing elderly adult female, alert and in no acute distress.   Eyes: Anicteric, conjunctiva pink, lids and lashes normal.     ENT: No nasal deformity, discharge, or epistaxis.  OP moist without lesions.   Skin: Warm and dry.  No jaundice.  No suspicious rashes or lesions. Cardiac: RRR, nl S1-S2, no murmurs appreciated.  Capillary refill is brisk.  JVP normal.  No LE edema.  Radial and DP pulses 2+ and symmetric.  RIGHT sided carotid bruits, I do not appreciate one on the left. Respiratory: Increased respiratory rate without increased effort or labored breathing.  Speaks normally.  Some diffuse rhonchi and cough but no focally diminished sounds or rales or wheezes on my exam. GI: Abdomen soft without rigidity.  No TTP. No ascites, distension.   MSK: No deformities or effusions. Neuro: Pupils are 4 mm and reactive to 3 mm. Extraocular movements are intact, without nystagmus. Cranial nerve 5 is within normal limits. Cranial nerve 7  is symmetrical. Cranial nerve 8 is within normal limits. Cranial nerves 9 and 10 reveal equal palate elevation. Cranial nerve 11 reveals sternocleidomastoid strong. Cranial nerve 12 is midline. Sensation intact to light touch.  Mild right arm contracture, and slight right arm decreased strength, but otherwise strength testing normal.  Babinski normal. The patient is oriented to time, place and person. Speech is fluent. Naming is grossly intact. Recall, recent and remote, as well as general fund of knowledge seem only mildly impaired. Attention span and concentration are within normal limits.   Psych: Behavior appropriate.  Affect pleasant.  No evidence of aural or visual hallucinations or delusions.       Labs on Admission:  I have personally reviewed following labs and imaging studies: CBC:  Recent Labs Lab 05/14/16 2016  WBC 5.2  NEUTROABS 3.3  HGB 12.8  HCT 41.2  MCV 89.2  PLT 190  Basic Metabolic Panel:  Recent Labs Lab 05/14/16 2016  NA 139  K 3.3*  CL 107  CO2 26  GLUCOSE 79  BUN 11  CREATININE 0.95  CALCIUM 9.0   GFR: Estimated Creatinine Clearance: 36.8 mL/min (by C-G formula based on SCr of 0.95 mg/dL). Liver Function Tests:  Recent Labs Lab 05/14/16 2016  AST 15  ALT 12*  ALKPHOS 76  BILITOT 0.4  PROT 6.2*  ALBUMIN 3.3*   CBG:  Recent Labs Lab 05/14/16 1930  GLUCAP 86      Radiological Exams on Admission: Following reports were personally reviewed: Dg Chest 2 View  Result Date: 05/14/2016 CLINICAL DATA:  Two-day history of cough and right-sided weakness. EXAM: CHEST  2 VIEW COMPARISON:  09/08/2015 FINDINGS: Frontal film is markedly rotated. Lungs are hyperexpanded without focal airspace consolidation or overt pulmonary edema. No pleural effusion or pneumothorax evident. Cardiopericardial silhouette is at upper limits of normal for size. Vascular stent device noted in the region of the left thoracic inlet. The visualized bony structures of  the thorax are intact. Telemetry leads overlie the chest. IMPRESSION: Emphysema without acute cardiopulmonary findings. Electronically Signed   By: Misty Stanley M.D.   On: 05/14/2016 21:39   Ct Head Wo Contrast  Result Date: 05/14/2016 CLINICAL DATA:  Right leg weakness. EXAM: CT HEAD WITHOUT CONTRAST TECHNIQUE: Contiguous axial images were obtained from the base of the skull through the vertex without intravenous contrast. COMPARISON:  MRI 08/12/2015 FINDINGS: Brain: Moderate atrophy. Chronic ischemic changes in the white matter. Chronic left MCA infarct over the convexity. Negative for acute infarct. Negative for hemorrhage or mass. Vascular: Mild atherosclerotic calcification in the carotid and vertebral arteries. Skull: Benign-appearing bony exostosis left frontal bone. No acute skull abnormality Sinuses/Orbits: Negative Other: Negative IMPRESSION: Atrophy and chronic ischemic changes.  No acute abnormality. Electronically Signed   By: Franchot Gallo M.D.   On: 05/14/2016 21:34     EKG: Independently reviewed. Rate 80, QTc 476, small limb leads, no ST segment changes, sinus rhythm.    Assessment/Plan 1. Acute TIA:  This is new.  MRI pending.  ABCD 7, high risk. -Admit to telemetry -Neuro checks, NIHSS per protocol -Antiplatelet agent: continue Plavix -Check lipids, hemoglobin A1c -CT angiography of head and neck per Neurology, ordered -Echocardiogram ordered -PT consultation -Consult to Neurology, appreciate recommendations   2. NIDDM:  Has been able to stop metformin.  Normal sugar at admission. -Will hold correction insulin, but check glucose TID with meals and start if needed  3. HTN:  Hypertensive at admission.  Normotensive at last PCP visit in July. -Continue home metoprolol  4. Malnutrition:  -Continue Ensure  5. COPD:  -Continue Anoro  -Albuterol PRN      DVT prophylaxis: Lovenox  Code Status: FULL  Family Communication: Grandson at bedside  Disposition  Plan: Anticipate Stroke work up as above and consult to ancillary services.  Expect discharge within 1 days. Consults called: Neurology, Dr. Shon Hale has seen patient. Admission status: Telemetry, OBS status  Core measures: -VTE prophylaxis ordered at time of admission -Aspirin not ordered given on Plavix already -Atrial fibrillation: A flutter was in the patient's problem list, but the ECG on 09/08/15 that is described as Aflutter shows a sinus rhythm, and is incorrectly labeled and so this was deleted.  SHe has never been on anticoagulation and at her Cardiology clearance before CEA last year there was no mention of Afib or flutter -tPA not given because of symptoms resolved -Dysphagia screen ordered  in ER -Lipids ordered -PT eval ordered -Smoking cessation recommended    Medical decision making: Patient seen at 12:19 AM on 05/15/2016.  The patient was discussed with Dr. Shon Hale and Dr. Rex Kras. What exists of the patient's chart and outside records in [place] was reviewed in depth.  Clinical condition: stable.       Edwin Dada [facility] Pager [phone]

## 2016-05-15 NOTE — Progress Notes (Signed)
Patient Demographics:    Jasmine Arias, is a 81 y.o. female, DOB - [birth year], [id number]  Admit date - 05/14/2016   Admitting Physician Edwin Dada, MD  Outpatient Primary MD for the patient is Park Liter, DO  LOS - 0   Chief Complaint  Patient presents with  . Weakness  . Stroke Symptoms        Subjective:    Jasmine Arias today has no fevers, no emesis,  No chest pain,  Without new deficits or new complaints,   Assessment  & Plan :    Principal Problem:   Right leg weakness Active Problems:   Malnutrition ([place])   Type 2 diabetes mellitus with background retinopathy ([place])   Essential hypertension   Compulsive tobacco user syndrome   Mixed Alzheimer's and vascular dementia   COPD (chronic obstructive pulmonary disease) ([facility])   1)Acute CVA Punctate CVA - echocardiogram pending, continue aspirin, Discussed with Neurologist,  patient will most likely need, repeat CTA head in 3 months, patient will also need aspirin and Plavix combo for 3 months and then Plavix only after that. PT OT eval   2)HTN- Allow some permissive Hypertension due to acute stroke, avoid very aggressive, rapid BP control at this time.    3Code Status : full   Disposition Plan  : home with home health in am   Consults  :  neurology   DVT Prophylaxis  :  Lovenox -    Lab Results  Component Value Date   PLT 190 05/14/2016    Inpatient Medications  Scheduled Meds: . arformoterol  15 mcg Nebulization BID  . aspirin EC  325 mg Oral Daily  . atorvastatin  40 mg Oral q1800  . clopidogrel  75 mg Oral Q breakfast  . donepezil  10 mg Oral QHS  . enoxaparin (LOVENOX) injection  30 mg Subcutaneous Q24H  . feeding supplement (ENSURE ENLIVE)  237 mL Oral BID  . memantine  5 mg Oral BID  . metoprolol tartrate  12.5 mg Oral BID  . umeclidinium bromide  1 puff Inhalation Daily   Continuous Infusions:    PRN Meds:.acetaminophen **OR** acetaminophen, albuterol    Anti-infectives    None        Objective:   Vitals:   05/15/16 1049 05/15/16 1139 05/15/16 1232 05/15/16 1401  BP: 138/65 (!) 146/95 (!) 162/72 (!) 155/73  Pulse: 81  74 72  Resp: 16  15 19   Temp: 98.2 F (36.8 C)   97.9 F (36.6 C)  TempSrc: Oral   Oral  SpO2: 98%  96% 96%  Weight:      Height:        Wt Readings from Last 3 Encounters:  05/15/16 51.7 kg (114 lb)  04/05/16 48.3 kg (106 lb 6.4 oz)  02/27/16 46.7 kg (103 lb)    No intake or output data in the 24 hours ending 05/15/16 1608   Physical Exam  Gen:- Awake Alert,   HEENT:- [place].AT, No sclera icterus Neck-Supple Neck,No JVD,.  Lungs-  CTAB  CV- S1, S2 normal Abd-  +ve B.Sounds, Abd Soft, No tenderness,    Extremity/Skin:- No  edema,    Neuro- no significant new neuro deficits    Data Review:  Micro Results No results found for this or any previous visit (from the past 240 hour(s)).  Radiology Reports Ct Angio Head W Or Wo Contrast  Result Date: 05/15/2016 CLINICAL DATA:  81 y/o F; weakness and mental status changes. History of prior stroke with right-sided weakness. History of Alzheimer's disease. History of left carotid endarterectomy. EXAM: CT ANGIOGRAPHY HEAD AND NECK TECHNIQUE: Multidetector CT imaging of the head and neck was performed using the standard protocol during bolus administration of intravenous contrast. Multiplanar CT image reconstructions and MIPs were obtained to evaluate the vascular anatomy. Carotid stenosis measurements (when applicable) are obtained utilizing NASCET criteria, using the distal internal carotid diameter as the denominator. CONTRAST:  50 cc Isovue 370. COMPARISON:  CT of head dated 05/14/2016. MRI of brain dated 08/12/2015. CT angiogram of the neck dated 08/11/2015. FINDINGS: CTA NECK Aortic arch: 3 vessel aortic arch with severe calcific atherosclerosis. Concentric calcification of the left subclavian artery  origin with moderate approximately 50% stenosis. Right carotid system: New patent right common carotid artery with mild calcified plaque. Dense calcifications of the right carotid bifurcation with severe 70% stenosis of proximal ICA. The ICA is otherwise patent. Left carotid system: Small dissection flap just downstream to the origin of the left common carotid artery series 401, image 19. Patent stent within the mid left common carotid artery. No significant stenosis of the carotid bifurcation or internal carotid artery. Vertebral arteries:Right vertebral artery calcified plaque at the C6 level series 401, image 61 with probable moderate associated stenosis. No significant stenosis of the left vertebral artery. Skeleton: Moderate degenerative changes of the cervical spine without high-grade canal stenosis or foraminal narrowing. Other neck: Multiple thyroid nodules the largest in the left lobe measuring 6 mm. Stable enhancing mass within the superficial lobe of the left parotid gland with some extension into the deep lobecondyle measuring 17 x 25 x 47 mm (AP x ML x CC), series 401, image 7 the 8 and series 403, image 114. Deep to the enhancing mass is a 9 x 14 mm nodule in the expected location of the jugulodigastric lymph node IIa series 401, image 71 which may represent associated lymphadenopathy. Additional stable enhancing nodule within the left parotid tail measuring 5 mm. Upper chest: Mild centrilobular emphysema of the lung apices bilaterally. Left upper lobe a 6 mm ground-glass nodule series 401, image 12. CTA HEAD Anterior circulation: Moderate calcific atherosclerosis of cavernous internal carotid arteries without significant stenosis. Bilateral middle cerebral arteries, anterior cerebral arteries, and anterior communicating artery are patent. No high-grade stenosis or large vessel occlusion is identified. 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 Posterior circulation: Small bilateral P1 segments with large posterior communicating arteries consistent with persistent fetal circulation. Bilateral V4 segments, the basilar artery, and posterior cerebral arteries are patent. No occlusion, aneurysm, dissection, or significant stenosis is identified. Venous sinuses: Patent. Anatomic variants: Bilateral fetal PCAs. Delayed phase: No abnormal enhancement of the brain parenchyma. Unchanged left frontal bone osteoma. Mild parenchymal volume loss and moderate chronic microvascular ischemic changes. No hydrocephalus. Chronic left frontal infarct. No intracranial hemorrhage. IMPRESSION: 1. No large vessel occlusion or high-grade stenosis of the circle of Willis. 2. Severe 70% stable stenosis of the right proximal internal carotid artery. 3. Patent left mid common carotid artery stent, small dissection flap proximal to the stent appears new from prior to stent placement, no interval study to determine chronicity. 4. 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 5. Stable moderate approximately 50% stenosis of left subclavian artery origin. 6. 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 Possible enlarged left-sided level 2a lymph node. Stable 5 mm nodule in the left parotid tail. 7. Mild bilateral upper lobe emphysema. 8. **An incidental finding of potential clinical significance has been found. 6 mm left upper lobe ground-glass nodule.Initial follow-up with CT at 6-12 months is recommended to confirm persistence. If persistent, repeat CT is recommended every 2 years until 5 years of stability has been established. This recommendation follows the consensus statement: Guidelines for Management of Incidental Pulmonary Nodules Detected on CT Images:From the Fleischner Society  2017; published online before print (10.1148/radiol.IJ:2314499).** These results will be called to the ordering clinician or representative by the Radiologist Assistant, and communication documented in the PACS or zVision Dashboard. Electronically Signed   By: Kristine Garbe M.D.   On: 05/15/2016 05:57   Dg Chest 2 View  Result Date: 05/14/2016 CLINICAL DATA:  Two-day history of cough and right-sided weakness. EXAM: CHEST  2 VIEW COMPARISON:  09/08/2015 FINDINGS: Frontal film is markedly rotated. Lungs are hyperexpanded without focal airspace consolidation or overt pulmonary edema. No pleural effusion or pneumothorax evident. Cardiopericardial silhouette is at upper limits of normal for size. Vascular stent device noted in the region of the left thoracic inlet. The visualized bony structures of the thorax are intact. Telemetry leads overlie the chest. IMPRESSION: Emphysema without acute cardiopulmonary findings. Electronically Signed   By: Misty Stanley M.D.   On: 05/14/2016 21:39   Ct Head Wo Contrast  Result Date: 05/14/2016 CLINICAL DATA:  Right leg weakness. EXAM: CT HEAD WITHOUT CONTRAST TECHNIQUE: Contiguous axial images were obtained from the base of the skull through the vertex without intravenous contrast. COMPARISON:  MRI 08/12/2015 FINDINGS: Brain: Moderate atrophy. Chronic ischemic changes in the white matter. Chronic left MCA infarct over the convexity. Negative for acute infarct. Negative for hemorrhage or mass. Vascular: Mild atherosclerotic calcification in the carotid and vertebral arteries. Skull: Benign-appearing bony exostosis left frontal bone. No acute skull abnormality Sinuses/Orbits: Negative Other: Negative IMPRESSION: Atrophy and chronic ischemic changes.  No acute abnormality. Electronically Signed   By: Franchot Gallo M.D.   On: 05/14/2016 21:34   Ct Angio Neck W/cm &/or Wo/cm  Result Date: 05/15/2016 CLINICAL DATA:  81 y/o F; weakness and mental status changes.  History of prior stroke with right-sided weakness. History of Alzheimer's disease. History of left carotid endarterectomy. EXAM: CT ANGIOGRAPHY HEAD AND NECK TECHNIQUE: Multidetector CT imaging of the head and neck was performed using the standard protocol during bolus administration of intravenous contrast. Multiplanar CT image reconstructions and MIPs were obtained to evaluate the vascular anatomy. Carotid stenosis measurements (when applicable) are obtained utilizing NASCET criteria, using the distal internal carotid diameter as the denominator. CONTRAST:  50 cc Isovue 370. COMPARISON:  CT of head dated 05/14/2016. MRI of brain dated 08/12/2015. CT angiogram of the neck dated 08/11/2015. FINDINGS: CTA NECK Aortic arch: 3 vessel aortic arch with severe calcific atherosclerosis. Concentric calcification of the left subclavian artery origin with moderate approximately 50% stenosis. Right carotid system: New patent right common carotid artery with mild calcified plaque. Dense calcifications of the right carotid bifurcation with severe 70% stenosis of proximal ICA. The ICA is otherwise patent. Left carotid system: Small dissection flap just downstream to the  origin of the left common carotid artery series 401, image 19. Patent stent within the mid left common carotid artery. No significant stenosis of the carotid bifurcation or internal carotid artery. Vertebral arteries:Right vertebral artery calcified plaque at the C6 level series 401, image 61 with probable moderate associated stenosis. No significant stenosis of the left vertebral artery. Skeleton: Moderate degenerative changes of the cervical spine without high-grade canal stenosis or foraminal narrowing. Other neck: Multiple thyroid nodules the largest in the left lobe measuring 6 mm. Stable enhancing mass within the superficial lobe of the left parotid gland with some extension into the deep lobecondyle measuring 17 x 25 x 47 mm (AP x ML x CC), series 401,  image 7 the 8 and series 403, image 114. Deep to the enhancing mass is a 9 x 14 mm nodule in the expected location of the jugulodigastric lymph node IIa series 401, image 71 which may represent associated lymphadenopathy. Additional stable enhancing nodule within the left parotid tail measuring 5 mm. Upper chest: Mild centrilobular emphysema of the lung apices bilaterally. Left upper lobe a 6 mm ground-glass nodule series 401, image 12. CTA HEAD Anterior circulation: Moderate calcific atherosclerosis of cavernous internal carotid arteries without significant stenosis. Bilateral middle cerebral arteries, anterior cerebral arteries, and anterior communicating artery are patent. No high-grade stenosis or large vessel occlusion is identified. 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 Posterior circulation: Small bilateral P1 segments with large posterior communicating arteries consistent with persistent fetal circulation. Bilateral V4 segments, the basilar artery, and posterior cerebral arteries are patent. No occlusion, aneurysm, dissection, or significant stenosis is identified. Venous sinuses: Patent. Anatomic variants: Bilateral fetal PCAs. Delayed phase: No abnormal enhancement of the brain parenchyma. Unchanged left frontal bone osteoma. Mild parenchymal volume loss and moderate chronic microvascular ischemic changes. No hydrocephalus. Chronic left frontal infarct. No intracranial hemorrhage. IMPRESSION: 1. No large vessel occlusion or high-grade stenosis of the circle of Willis. 2. Severe 70% stable stenosis of the right proximal internal carotid artery. 3. Patent left mid common carotid artery stent, small dissection flap proximal to the stent appears new from prior to stent placement, no interval study to determine chronicity. 4. 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 5. Stable moderate approximately 50% stenosis of left subclavian artery origin. 6. 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 Possible enlarged left-sided level 2a lymph node. Stable 5 mm nodule in the left parotid tail. 7. Mild bilateral upper lobe emphysema. 8. **An incidental finding of potential clinical significance has been found. 6 mm left upper lobe ground-glass nodule.Initial follow-up with CT at 6-12 months is recommended to confirm persistence. If persistent, repeat CT is recommended every 2 years until 5 years of stability has been established. This recommendation follows the consensus statement: Guidelines for Management of Incidental Pulmonary Nodules Detected on CT Images:From the Fleischner Society 2017; published online before print (10.1148/radiol.SG:[id number]).** These results will be called to the ordering clinician or representative by the Radiologist Assistant, and communication documented in the PACS or zVision Dashboard. Electronically Signed   By: Kristine Garbe M.D.   On: 05/15/2016 05:57   Mr Brain Wo Contrast  Addendum Date: 05/15/2016   ADDENDUM REPORT: 05/15/2016 06:46 ADDENDUM: These results were called by telephone at the time of interpretation  on 05/15/2016 at 6:46 am to Dr. Myrene Buddy , who verbally acknowledged these results. Electronically Signed   By: Kristine Garbe M.D.   On: 05/15/2016 06:46   Result Date: 05/15/2016 CLINICAL DATA:  81 y/o F; history of cerebral vascular accident after carotid endarterectomy complicated by partial dissection last November presenting with transient right leg weakness. EXAM: MRI HEAD WITHOUT CONTRAST TECHNIQUE: Multiplanar, multiecho pulse sequences of the brain and surrounding structures were obtained without intravenous contrast. COMPARISON:  CTA of the head dated 05/15/2016. MRI of  the brain dated 08/12/2015. FINDINGS: Brain: Punctate focus of diffusion hyperintensity within the left precentral gyrus with probable low ADC is likely to represent an acute/early subacute infarct. Additional small foci of diffusion hyperintensity left posterior frontal centrum semiovale do not demonstrate definite low ADC and are probably T2 shine through. No abnormal susceptibility hypointensity to indicate intracranial hemorrhage. There is mild diffuse parenchymal volume loss and moderate chronic microvascular ischemic changes as wall is several small chronic cortical infarcts in the left posterior frontal and parietal lobes. Extra-axial space: Normal ventricular size. No midline shift. No effacement of basilar cisterns. No extra-axial collection is identified. Proximal intracranial flow voids are maintained. No abnormality of the cervical medullary junction. Cavum septum pellucidum. Other: Left maxillary sinus mucous retention cyst and mild mucosal thickening of the ethmoid air cells. No abnormal signal of the mastoid air cells. Orbits are unremarkable. Stable left frontal bone osteoma, otherwise the calvarium is unremarkable. IMPRESSION: 1. Punctate acute/early subacute infarct within the left precentral gyrus. 2. Mild parenchymal volume loss, moderate chronic microvascular ischemic changes, and small chronic infarcts in the left posterior frontal/parietal lobes. 3. Mild paranasal sinus disease. Electronically Signed: By: Kristine Garbe M.D. On: 05/15/2016 06:17     CBC  Recent Labs Lab 05/14/16 2016  WBC 5.2  HGB 12.8  HCT 41.2  PLT 190  MCV 89.2  MCH 27.7  MCHC 31.1  RDW 15.2  LYMPHSABS 1.4  MONOABS 0.4  EOSABS 0.1  BASOSABS 0.0    Chemistries   Recent Labs Lab 05/14/16 2016  NA 139  K 3.3*  CL 107  CO2 26  GLUCOSE 79  BUN 11  CREATININE 0.95  CALCIUM 9.0  AST 15  ALT 12*  ALKPHOS 76  BILITOT 0.4    ------------------------------------------------------------------------------------------------------------------  Recent Labs  05/15/16 0600  CHOL 218*  HDL 56  LDLCALC 147*  TRIG 76  CHOLHDL 3.9    Lab Results  Component Value Date   HGBA1C 6.1 (H) 09/08/2015   ------------------------------------------------------------------------------------------------------------------ No results for input(s): TSH, T4TOTAL, T3FREE, THYROIDAB in the last 72 hours.  Invalid input(s): FREET3 ------------------------------------------------------------------------------------------------------------------ No results for input(s): VITAMINB12, FOLATE, FERRITIN, TIBC, IRON, RETICCTPCT in the last 72 hours.  Coagulation profile No results for input(s): INR, PROTIME in the last 168 hours.  No results for input(s): DDIMER in the last 72 hours.  Cardiac Enzymes No results for input(s): CKMB, TROPONINI, MYOGLOBIN in the last 168 hours.  Invalid input(s): CK ------------------------------------------------------------------------------------------------------------------ No results found for: BNP   Stephinie Battisti M.D on 05/15/2016 at 4:08 PM  Between 7am to 7pm - Pager - [phone]  After 7pm go to [email or web address] - password [email or web address]  [facility] -  Office  [phone]  Dragon dictation system was used to create this note, attempts have been made to correct errors, however presence of uncorrected errors is not a reflection quality of care provided

## 2016-05-15 NOTE — ED Notes (Addendum)
Pt arrived via EMS from home. Husband had noticed weakness and mental status changes starting when she woke up. The grandson states that she appeared normal until around 1300 when she started dragging her right foot when her baseline is normal ambulation. Pt had a previous stroke that left her with residual R sided weakness to her R arm. Confusion may be attributed to hx of Alzheimer's. Grandson at bedside states that her current states is close to baseline for her. While ambulating pt to bathroom pt drags her R leg but is able to ambulate better now than PTA. Pt reports dizziness, Per grandson she reports dizziness chronically.

## 2016-05-15 NOTE — Progress Notes (Addendum)
STROKE TEAM PROGRESS NOTE   HISTORY OF PRESENT ILLNESS (per record) This is a 81-yo woman with known dementia and h/o stroke resulting in R hemiparesis who now presents to the ED following an episode of RLE weakness at about 1600-1700 on 05/14/16. History is obtained largely from the patient's grandson who is present at the bedside. The patient has little recollection of today's events.   Jasmine grandson reports that he noticed she was having trouble moving Jasmine right leg when she was walking earlier today. This lasted about one hour and then seems to have resolved. The patient feels like she is at Jasmine usual baseline. Jasmine grandson relates that she had a prior stroke that affected Jasmine right arm and Jasmine right leg. After rehab, she regained full strength in Jasmine leg but has had some persistent weakness in the right arm. It does not sound as if she has had any recent acute illnesses. Jasmine PCP recently changed Jasmine dementia medications to Namzeric.   On review of Jasmine medical record, she suffered a stroke in November 2016 in the setting of a left carotid endarterectomy. She had experienced a TIA in April 2016 with reported symptoms of syncope, speech changes, and abnormal gait. Workup revealed 90% stenosis of the LICA and she initially declined surgery. After further reconsideration she decided to proceed with CEA and this was performed on 08/11/15. One hour after Jasmine surgery, she developed R arm weakness. Emergent CTA of the neck revealed a dissection with thrombus formation in the common carotid artery several centimeters below the patch for Jasmine CEA. A stent was placed across Jasmine dissection. She subsequently discharged to rehab due to persistent right-sided weakness. She was discharged on aspirin and Plavix. She was transitioned to Plavix monotherapy during a subsequent admission for syncope in December 2016.   Patient was not administered IV t-PA. She was admitted for further evaluation and  treatment.   SUBJECTIVE (INTERVAL HISTORY) Jasmine Arias is at the bedside.  He recounted the HPI - pt started to have dizziness on standing up, wobbly gait. EMS checked on Jasmine and recommended transfer to the hospital. He states she is the same today as she was yesterday. She reports she got up and went the the bathroom this am without difficulty. BP has been ok (not too low). Overall she feels Jasmine condition is at baseline. But Arias still thinks she is not stable at Jasmine feet. PT/OT pending.    OBJECTIVE Temp:  [97.8 F (36.6 C)-98.2 F (36.8 C)] 98.2 F (36.8 C) (08/16 1049) Pulse Rate:  [58-86] 81 (08/16 1049) Cardiac Rhythm: Normal sinus rhythm (08/16 0221) Resp:  [15-32] 16 (08/16 1049) BP: (138-192)/(65-92) 138/65 (08/16 1049) SpO2:  [96 %-100 %] 98 % (08/16 1049) Weight:  [48.1 kg (106 lb)-51.7 kg (114 lb)] 51.7 kg (114 lb) (08/16 0200)  CBC:   Recent Labs Lab 05/14/16 2016  WBC 5.2  NEUTROABS 3.3  HGB 12.8  HCT 41.2  MCV 89.2  PLT 99991111    Basic Metabolic Panel:   Recent Labs Lab 05/14/16 2016  NA 139  K 3.3*  CL 107  CO2 26  GLUCOSE 79  BUN 11  CREATININE 0.95  CALCIUM 9.0    Lipid Panel:     Component Value Date/Time   CHOL 218 (H) 05/15/2016 0600   CHOL 213 (H) 02/27/2016 1059   CHOL 144 05/11/2015 1355   TRIG 76 05/15/2016 0600   HDL 56 05/15/2016 0600   HDL 62 02/27/2016 1059  CHOLHDL 3.9 05/15/2016 0600   VLDL 15 05/15/2016 0600   LDLCALC 147 (H) 05/15/2016 0600   LDLCALC 136 (H) 02/27/2016 1059   HgbA1c:  Lab Results  Component Value Date   HGBA1C 6.1 (H) 09/08/2015   Urine Drug Screen: No results found for: LABOPIA, COCAINSCRNUR, LABBENZ, AMPHETMU, THCU, LABBARB    IMAGING I have personally reviewed the radiological images below and agree with the radiology interpretations.  Dg Chest 2 View 05/14/2016 Emphysema without acute cardiopulmonary findings.   Ct Head Wo Contrast 05/14/2016 Atrophy and chronic ischemic changes.  No  acute abnormality.   Ct Angio Head W Or Wo Contrast Ct Angio Neck W/cm &/or Wo/cm 05/15/2016 1. No large vessel occlusion or high-grade stenosis of the circle of Willis. 2. Severe 70% stable stenosis of the right proximal internal carotid artery. 3. Patent left mid common carotid artery stent, small dissection flap proximal to the stent appears new from prior to stent placement, no interval study to determine chronicity. 4. 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 5. Stable moderate approximately 50% stenosis of left subclavian artery origin. 6. 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 Possible enlarged left-sided level 2a lymph node. Stable 5 mm nodule in the left parotid tail. 7. Mild bilateral upper lobe emphysema. 8. An incidental finding of potential clinical significance has been found. 6 mm left upper lobe ground-glass nodule.Initial follow-up with CT at 6-12 months is recommended to confirm persistence. If persistent, repeat CT is recommended every 2 years until 5 years of stability has been established.   Mr Brain Wo Contrast 05/15/2016 1. Punctate acute/early subacute infarct within the left precentral gyrus. 2. Mild parenchymal volume loss, moderate chronic microvascular ischemic changes, and small chronic infarcts in the left posterior frontal/parietal lobes. 3. Mild paranasal sinus disease.   TTE - pending   PHYSICAL EXAM  Temp:  [97.8 F (36.6 C)-98.2 F (36.8 C)] 97.9 F (36.6 C) (08/16 1401) Pulse Rate:  [58-86] 72 (08/16 1401) Resp:  [15-32] 19 (08/16 1401) BP: (138-192)/(65-95) 155/73 (08/16 1401) SpO2:  [96 %-100 %] 96 % (08/16 1401) Weight:  [106 lb (48.1 kg)-114 lb (51.7 kg)] 114 lb (51.7 kg) (08/16 0200)  General - Well nourished, well developed, in no apparent distress.  Ophthalmologic - Fundi  not visualized due to noncooperation.  Cardiovascular - Regular rate and rhythm.  Mental Status -  Awake alert, orientated to self and Arias and place, not orientated to time or president Language including expression, naming, repetition, comprehension was assessed and found intact.  Cranial Nerves II - XII - II - Visual field intact OU. III, IV, VI - Extraocular movements intact. V - Facial sensation intact bilaterally. VII - Facial movement intact bilaterally. VIII - Hearing & vestibular intact bilaterally. X - Palate elevates symmetrically. XI - Chin turning & shoulder shrug intact bilaterally. XII - Tongue protrusion intact.  Motor Strength - The patient's strength was normal in all extremities and pronator drift was absent.  Bulk was normal and fasciculations were absent.   Motor Tone - Muscle tone was assessed at the neck and appendages and was normal.  Reflexes - The patient's reflexes were 1+ in all extremities and she had no pathological reflexes.  Sensory - Light touch, temperature/pinprick were assessed and were symmetrical.    Coordination - The patient had RUE ataxia on FTN  out of proportion to weakness which is old as per Arias.  Tremor was absent.  Gait and Station - deferred to PT due to safety concerns.   ASSESSMENT/PLAN Jasmine Arias is a 81 y.o. female with history of NIDDM, HTN, smoking, COPD and CVA after CEA was complicated by partial dissection last Nov now presenting with transient R leg weakness. She did not receive IV t-PA.   Stroke:  Questionable punctate left precentral gyrus infarct, etiology unclear, may related to possible small dissection flap proximal to the L CCA stent, however, intermal flap seems chronic   MRI  left punctate precentral gyrus infarct    CTA head & neck no significant large vessel disease COW. Severe stenoses R ICA 70%. Patent L CCA stent with small dissection flap proximal to stent. 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 L SCA 50%.    2D Echo  pending   LDL 147  HgbA1c pending  Lovenox 30 mg sq daily for VTE prophylaxis Diet heart healthy/carb modified Room service appropriate? Yes; Fluid consistency: Thin  clopidogrel 75 mg daily prior to admission (treated with DAPT previously), now on clopidogrel 75 mg daily. Given possible dissection add aspirin to plavix x 3 months and repeat CTA at the end.  Patient counseled to be compliant with Jasmine antithrombotic medications  Ongoing aggressive stroke risk factor management  Therapy recommendations:  Home PT  Disposition:  pending (lives with Arias)  Carotid stenosis, bilaterally  Right ICA 70% stenosis, unchanged - asymptomatic  Left ICA s/p CEA and CCA stenting  New left CCA small possible dissection flap proximal to CCA stent, seems chronic  Recommend DAPT for another 3 months and then repeat CTA neck at that time.  Hypertension  Elevated, 192/75  BP has not been low  In the 140s during rounds  Permissive hypertension (OK if < 220/120) but gradually normalize in 5-7 days  Long-term BP goal normotensive  Please check orthostatic vitals -  ordered  Hyperlipidemia  Home meds:  No statin  LDL 147, goal < 70  Add statin lipitor 40 (had been on in the past and Jasmine doctor stopped it, she doesn't remember why)  Continue statin at discharge  Tobacco abuse  Current smoker  Smoking cessation counseling provided  Pt is willing to quit  Other Stroke Risk Factors  Advanced age  Hx stroke/TIA  12/2014 TIA - 90% L ICA stenosis, [id number] L CEA complicated by L MCA stroke from a L CCA partial dissection and thrombus to MCA. Slurred speech and R arm and leg weakness-> still has right UE weakness  Other Active Problems  Baseline dementia, on aricept and namenda  Malnutrition, Body mass index is 21.54 kg/m.  COPD, on [facility] day # 0   Rosalin Hawking, MD PhD Stroke Neurology 05/15/2016 5:08 PM    To contact Stroke Continuity  provider, please refer to [email or web address] After hours, contact General Neurology

## 2016-05-15 NOTE — Consult Note (Signed)
Neurology Consult Note  Reason for Consultation: Possible TIA  Requesting provider: Theotis Burrow, MD  CC: RLE weakness  HPI: This is a 56-yo woman with known dementia and h/o stroke resulting in R hemiparesis who now presents to the ED following an episode of RLE weakness at about 1600-1700 on 05/14/16. History is obtained largely from the patient's grandson who is present at the bedside. The patient has little recollection of today's events.   Her grandson reports that he noticed she was having trouble moving her right leg when she was walking earlier today. This lasted about one hour and then seems to have resolved. The patient feels like she is at her usual baseline. Her grandson relates that she had a prior stroke that affected her right arm and her right leg. After rehab, she regained full strength in her leg but has had some persistent weakness in the right arm. It does not sound as if she has had any recent acute illnesses. Her PCP recently changed her dementia medications to Namzeric.   On review of her medical record, she suffered a stroke in November 2016 in the setting of a left carotid endarterectomy. She had experienced a TIA in April 2016 with reported symptoms of syncope, speech changes, and abnormal gait. Workup revealed 90% stenosis of the LICA and she initially declined surgery. After further reconsideration she decided to proceed with CEA and this was performed on 08/11/15. One hour after her surgery, she developed R arm weakness. Emergent CTA of the neck revealed a dissection with thrombus formation in the common carotid artery several centimeters below the patch for her CEA. A stent was placed across her dissection. She subsequently discharged to rehab due to persistent right-sided weakness. She was discharged on aspirin and Plavix. She was transitioned to Plavix monotherapy during a subsequent admission for syncope in December 2016.   PMH:  Past Medical History:  Diagnosis Date   . Anemia   . Anxiety   . COPD (chronic obstructive pulmonary disease) ([place])   . Dementia   . Diabetes mellitus without complication ([place])   . Hyperlipidemia   . Hypertension   . Indigestion   . Memory changes   . Stroke (cerebrum) ([facility])     PSH:  Past Surgical History:  Procedure Laterality Date  . ABDOMINAL HYSTERECTOMY     heavy bleeding  . CAROTID ARTERY - SUBCLAVIAN ARTERY BYPASS GRAFT    . ENDARTERECTOMY Left 08/11/2015   Procedure: ENDARTERECTOMY CAROTID;  Surgeon: Katha Cabal, MD;  Location: [facility] ORS;  Service: Vascular;  Laterality: Left;  . PERIPHERAL VASCULAR CATHETERIZATION Left 08/11/2015   Procedure: Carotid Angiography;  Surgeon: Katha Cabal, MD;  Location: [facility];  Service: Cardiovascular;  Laterality: Left;    Family history: Family History  Problem Relation Age of Onset  . Hypertension Mother   . Hypertension Father   . Heart disease Sister   . Hypertension Brother     Social history:  Social History   Social History  . Marital status: Married    Spouse name: N/A  . Number of children: N/A  . Years of education: N/A   Occupational History  . Not on file.   Social History Main Topics  . Smoking status: Current Every Day Smoker    Packs/day: 0.75  . Smokeless tobacco: Never Used  . Alcohol use No  . Drug use: No  . Sexual activity: Not on file   Other Topics Concern  . Not on file  Social History Narrative  . No narrative on file    Current outpatient meds: Current Meds  Medication Sig  . clopidogrel (PLAVIX) 75 MG tablet Take 1 tablet (75 mg total) by mouth daily with breakfast.  . donepezil (ARICEPT) 10 MG tablet Take 1 tablet (10 mg total) by mouth at bedtime.  . memantine (NAMENDA) 5 MG tablet Take 1 tablet (5 mg total) by mouth 2 (two) times daily.  . metoprolol tartrate (LOPRESSOR) 25 MG tablet Take 0.5 tablets (12.5 mg total) by mouth 2 (two) times daily.  . Nutritional Supplements (ENSURE NUTRITION  SHAKE) LIQD Take 8 oz by mouth 2 (two) times daily.  Marland Kitchen umeclidinium-vilanterol (ANORO ELLIPTA) 62.5-25 MCG/INH AEPB Inhale 1 puff into the lungs daily.    Current inpatient meds:  No current facility-administered medications for this encounter.    Current Outpatient Prescriptions  Medication Sig Dispense Refill  . clopidogrel (PLAVIX) 75 MG tablet Take 1 tablet (75 mg total) by mouth daily with breakfast. 90 tablet 1  . donepezil (ARICEPT) 10 MG tablet Take 1 tablet (10 mg total) by mouth at bedtime. 90 tablet 1  . memantine (NAMENDA) 5 MG tablet Take 1 tablet (5 mg total) by mouth 2 (two) times daily. 180 tablet 1  . metoprolol tartrate (LOPRESSOR) 25 MG tablet Take 0.5 tablets (12.5 mg total) by mouth 2 (two) times daily. 180 tablet 1  . Nutritional Supplements (ENSURE NUTRITION SHAKE) LIQD Take 8 oz by mouth 2 (two) times daily. 237 mL 12  . umeclidinium-vilanterol (ANORO ELLIPTA) 62.5-25 MCG/INH AEPB Inhale 1 puff into the lungs daily. 60 each 3  . acetaminophen (TYLENOL) 325 MG tablet Take 1-2 tablets (325-650 mg total) by mouth every 4 (four) hours as needed for mild pain (or temp >/= 101 F). 30 tablet 1    Allergies: No Known Allergies  ROS: As per HPI. A full 14-point review of systems was performed and is otherwise unremarkable.   PE:  BP 186/77   Pulse 69   Temp 98.2 F (36.8 C) (Oral)   Resp 20   Ht 5\' 1"  (1.549 m)   Wt 48.1 kg (106 lb)   LMP  (LMP Unknown)   SpO2 100%   BMI 20.03 kg/m   General: WDWN, no acute distress. Speech clear, no dysarthria. No aphasia. Follows commands briskly. Affect is bright with congruent mood. Comportment is normal.  HEENT: Normocephalic. Neck supple without LAD. MMM. Sclerae anicteric. No conjunctival injection.  CV: Regular, no murmur. Carotid pulses full and symmetric, no bruits. Distal pulses 2+ and symmetric.  Lungs: CTAB on anterior exam.  Abdomen: Soft, non-distended. Extremities: No C/C/E. Neuro:  CN: Pupils are equal and  round. They are symmetrically reactive from 3-->2 mm. EOMI notable for breakup of smooth pursuits without nystagmus. No reported diplopia. Facial sensation is intact to light touch. Face is symmetric at rest with normal strength and mobility. Hearing is intact to conversational voice. Palate elevates symmetrically and uvula is midline. Voice is normal in tone, pitch and quality. Bilateral SCM and trapezii are 5/5. Tongue is midline with normal bulk and mobility.  Motor: Normal bulk, tone, and strength with the exception of 4/5 R triceps and finger extensors and 4+/5 R grip. No tremor or other abnormal movements. No drift.  Sensation: Intact to light touch. DTRs: 2+ on the left, 3+ on the right. Toes downgoing bilaterally. No pathologic reflexes.  Coordination: Finger-to-nose is without dysmetria. Finger taps are slow and deliberate on the right.    Labs:  Lab Results  Component Value Date   WBC 5.2 05/14/2016   HGB 12.8 05/14/2016   HCT 41.2 05/14/2016   PLT 190 05/14/2016   GLUCOSE 79 05/14/2016   CHOL 213 (H) 02/27/2016   TRIG 75 02/27/2016   HDL 62 02/27/2016   LDLCALC 136 (H) 02/27/2016   ALT 12 (L) 05/14/2016   AST 15 05/14/2016   NA 139 05/14/2016   K 3.3 (L) 05/14/2016   CL 107 05/14/2016   CREATININE 0.95 05/14/2016   BUN 11 05/14/2016   CO2 26 05/14/2016   TSH 0.554 02/27/2016   INR 1.22 08/11/2015   HGBA1C 6.1 (H) 09/08/2015   MICROALBUR 30 (H) 05/11/2015   UA negative  Imaging:  I have reviewed the radiologist's report on her CT head without contrast from 05/14/16.   I have also personally and independently reviewed the [facility] without contrast from 05/14/16. This was compared to a prior MRI brain from 08/12/15. CTH shows focal hypodensity in the superior aspect of the left frontal lobe involving the lateral aspect of the precentral gyrus, consistent with chronic ischemia. This is consistent with encephalomalacia from the stroke visualized in the same area on her MRI in  2016. There is moderate to severe small vessel ischemic change in the bihemispheric white matter with associated hydrocephalus ex-vacuo. There is mild diffuse cortical atrophy with prominent temporal horns of both lateral ventricles suggesting more focal atrophy of the hippocampal structures bilaterally. She has a cavum septum pellucidum.    Assessment and Plan:  1. Possible TIA: Symptoms today are concerning for possible TIA versus mild acute stroke. Her known risk factors for cerebrovascular disease include prior stroke, HTN, hyperlipidemia, and age. Recommend further evaluation with MRI brain, CTA neck, TTE, fasting lipids, and hemoglobin a1c. She was on Plavix at the time of this event. I would continue this for now as there is little evidence to support transitioning from Plavix to Aggrenox in this setting. She has no indication for anticoagulation at this time. She is not presently on a statin and I would recommend resuming this given her history of atherosclerotic carotid disease and her degree of small vessel disease on imaging. Ensure tight control of lipids and blood glucose. Gently tighten blood pressure control over the next several days as needed.   2. RUE weakness: This is mild and chronic, due to a prior stroke in November 2016. No acute intervention necessary.   3. RLE weakness: This was transient today. ? TIA versus small acute stroke with workup as delineated above. She has no weakness on her exam right now. Monitor.   4. Dementia: This is chronic. I suspect this is a mixed dementia with elements of vascular dementia and Alzheimer's disease. Continue outpatient meds--PCP notes suggest she was switched to Namzeric but if this is not available in-house she can take donepezil 10 mg daily and memantine 5 mg BID. Her dementia places her at risk for delirium from all causes as well as sundowning. Gentle reorientation and dedicated efforts to maintain normal sleep-wake cycle are recommended,  keeping room bright and active by day while allowing dark, quiet, and minimal interruptions for care at night.   This was discussed with Dr. Loleta Books at the time of consultation.   Thank you for the opportunity to participate in Jasmine Arias's care. Please feel free to call with any questions or concerns. The stroke team will assume care in AM 05/15/16.

## 2016-05-15 NOTE — Progress Notes (Signed)
Alarm going off in room. Tech in room with pt. Pt standing next to bed, would not get in bed. Trying to fight with tech, pt stating she was not going to fall. rn went to assist pt into bed. Pt still pushing staff away. Pt finally got into bed. Pt calmed down and became cooperative.

## 2016-05-15 NOTE — Care Management Note (Signed)
Case Management Note  Patient Details  Name: Jasmine Arias MRN: [id number] Date of Birth: November 17, 1934  Subjective/Objective:  Pt admitted with CVA. She is from home with spouse.                  Action/Plan: Awaiting PT/OT recommendations. CM following for d/c needs.   Expected Discharge Date:                  Expected Discharge Plan:     In-House Referral:     Discharge planning Services     Post Acute Care Choice:    Choice offered to:     DME Arranged:    DME Agency:     HH Arranged:    HH Agency:     Status of Service:  In process, will continue to follow  If discussed at Long Length of Stay Meetings, dates discussed:    Additional Comments:  Pollie Friar, RN 05/15/2016, 10:57 AM

## 2016-05-16 DIAGNOSIS — I10 Essential (primary) hypertension: Secondary | ICD-10-CM | POA: Diagnosis not present

## 2016-05-16 DIAGNOSIS — F0280 Dementia in other diseases classified elsewhere without behavioral disturbance: Secondary | ICD-10-CM | POA: Diagnosis present

## 2016-05-16 DIAGNOSIS — E785 Hyperlipidemia, unspecified: Secondary | ICD-10-CM | POA: Diagnosis not present

## 2016-05-16 DIAGNOSIS — G459 Transient cerebral ischemic attack, unspecified: Secondary | ICD-10-CM | POA: Diagnosis not present

## 2016-05-16 DIAGNOSIS — I6789 Other cerebrovascular disease: Secondary | ICD-10-CM | POA: Diagnosis not present

## 2016-05-16 DIAGNOSIS — Z79899 Other long term (current) drug therapy: Secondary | ICD-10-CM | POA: Diagnosis not present

## 2016-05-16 DIAGNOSIS — I639 Cerebral infarction, unspecified: Secondary | ICD-10-CM | POA: Diagnosis present

## 2016-05-16 DIAGNOSIS — Z8673 Personal history of transient ischemic attack (TIA), and cerebral infarction without residual deficits: Secondary | ICD-10-CM | POA: Diagnosis not present

## 2016-05-16 DIAGNOSIS — I6522 Occlusion and stenosis of left carotid artery: Secondary | ICD-10-CM | POA: Diagnosis not present

## 2016-05-16 DIAGNOSIS — Z9889 Other specified postprocedural states: Secondary | ICD-10-CM | POA: Diagnosis not present

## 2016-05-16 DIAGNOSIS — J45901 Unspecified asthma with (acute) exacerbation: Secondary | ICD-10-CM | POA: Diagnosis not present

## 2016-05-16 DIAGNOSIS — R601 Generalized edema: Secondary | ICD-10-CM | POA: Diagnosis not present

## 2016-05-16 DIAGNOSIS — Z7902 Long term (current) use of antithrombotics/antiplatelets: Secondary | ICD-10-CM | POA: Diagnosis not present

## 2016-05-16 DIAGNOSIS — G308 Other Alzheimer's disease: Secondary | ICD-10-CM | POA: Diagnosis not present

## 2016-05-16 DIAGNOSIS — R911 Solitary pulmonary nodule: Secondary | ICD-10-CM | POA: Diagnosis present

## 2016-05-16 DIAGNOSIS — E11319 Type 2 diabetes mellitus with unspecified diabetic retinopathy without macular edema: Secondary | ICD-10-CM | POA: Diagnosis present

## 2016-05-16 DIAGNOSIS — Z720 Tobacco use: Secondary | ICD-10-CM | POA: Diagnosis not present

## 2016-05-16 DIAGNOSIS — R29898 Other symptoms and signs involving the musculoskeletal system: Secondary | ICD-10-CM | POA: Diagnosis not present

## 2016-05-16 DIAGNOSIS — Z48812 Encounter for surgical aftercare following surgery on the circulatory system: Secondary | ICD-10-CM | POA: Diagnosis not present

## 2016-05-16 DIAGNOSIS — R531 Weakness: Secondary | ICD-10-CM | POA: Diagnosis not present

## 2016-05-16 DIAGNOSIS — I69331 Monoplegia of upper limb following cerebral infarction affecting right dominant side: Secondary | ICD-10-CM | POA: Diagnosis not present

## 2016-05-16 DIAGNOSIS — I63422 Cerebral infarction due to embolism of left anterior cerebral artery: Secondary | ICD-10-CM | POA: Diagnosis not present

## 2016-05-16 DIAGNOSIS — R278 Other lack of coordination: Secondary | ICD-10-CM | POA: Diagnosis not present

## 2016-05-16 DIAGNOSIS — J449 Chronic obstructive pulmonary disease, unspecified: Secondary | ICD-10-CM | POA: Diagnosis not present

## 2016-05-16 DIAGNOSIS — H8111 Benign paroxysmal vertigo, right ear: Secondary | ICD-10-CM

## 2016-05-16 DIAGNOSIS — I6521 Occlusion and stenosis of right carotid artery: Secondary | ICD-10-CM | POA: Diagnosis present

## 2016-05-16 DIAGNOSIS — E119 Type 2 diabetes mellitus without complications: Secondary | ICD-10-CM | POA: Diagnosis not present

## 2016-05-16 DIAGNOSIS — Z6821 Body mass index (BMI) 21.0-21.9, adult: Secondary | ICD-10-CM | POA: Diagnosis not present

## 2016-05-16 DIAGNOSIS — K5900 Constipation, unspecified: Secondary | ICD-10-CM | POA: Diagnosis not present

## 2016-05-16 DIAGNOSIS — F17200 Nicotine dependence, unspecified, uncomplicated: Secondary | ICD-10-CM | POA: Diagnosis present

## 2016-05-16 DIAGNOSIS — F0150 Vascular dementia without behavioral disturbance: Secondary | ICD-10-CM | POA: Diagnosis present

## 2016-05-16 DIAGNOSIS — I69351 Hemiplegia and hemiparesis following cerebral infarction affecting right dominant side: Secondary | ICD-10-CM | POA: Diagnosis not present

## 2016-05-16 DIAGNOSIS — K119 Disease of salivary gland, unspecified: Secondary | ICD-10-CM | POA: Diagnosis present

## 2016-05-16 DIAGNOSIS — R269 Unspecified abnormalities of gait and mobility: Secondary | ICD-10-CM | POA: Diagnosis not present

## 2016-05-16 DIAGNOSIS — E46 Unspecified protein-calorie malnutrition: Secondary | ICD-10-CM | POA: Diagnosis not present

## 2016-05-16 DIAGNOSIS — R05 Cough: Secondary | ICD-10-CM | POA: Diagnosis not present

## 2016-05-16 LAB — GLUCOSE, CAPILLARY
GLUCOSE-CAPILLARY: 104 mg/dL — AB (ref 65–99)
GLUCOSE-CAPILLARY: 129 mg/dL — AB (ref 65–99)
Glucose-Capillary: 129 mg/dL — ABNORMAL HIGH (ref 65–99)
Glucose-Capillary: 90 mg/dL (ref 65–99)

## 2016-05-16 LAB — HEMOGLOBIN A1C
Hgb A1c MFr Bld: 6.4 % — ABNORMAL HIGH (ref 4.8–5.6)
MEAN PLASMA GLUCOSE: 137 mg/dL

## 2016-05-16 NOTE — Progress Notes (Signed)
STROKE TEAM PROGRESS NOTE   SUBJECTIVE (INTERVAL HISTORY) Her husband at the bedside. She just finished wokring with PT. Still complains of dizziness but comes and goes. Did Dix-Hallpike test questionable for right posterior semi-circular cannel BPPV but no nystagmus seen. Recommended vestibular PT.    OBJECTIVE Temp:  [97.6 F (36.4 C)-98.7 F (37.1 C)] 97.6 F (36.4 C) (08/17 0902) Pulse Rate:  [66-82] 80 (08/17 0902) Cardiac Rhythm: Normal sinus rhythm (08/17 0708) Resp:  [15-20] 20 (08/17 0902) BP: (135-173)/(51-95) 135/63 (08/17 0902) SpO2:  [94 %-98 %] 97 % (08/17 0902)  CBC:   Recent Labs Lab 05/14/16 2016  WBC 5.2  NEUTROABS 3.3  HGB 12.8  HCT 41.2  MCV 89.2  PLT 99991111    Basic Metabolic Panel:   Recent Labs Lab 05/14/16 2016  NA 139  K 3.3*  CL 107  CO2 26  GLUCOSE 79  BUN 11  CREATININE 0.95  CALCIUM 9.0    Lipid Panel:     Component Value Date/Time   CHOL 218 (H) 05/15/2016 0600   CHOL 213 (H) 02/27/2016 1059   CHOL 144 05/11/2015 1355   TRIG 76 05/15/2016 0600   HDL 56 05/15/2016 0600   HDL 62 02/27/2016 1059   CHOLHDL 3.9 05/15/2016 0600   VLDL 15 05/15/2016 0600   LDLCALC 147 (H) 05/15/2016 0600   LDLCALC 136 (H) 02/27/2016 1059   HgbA1c:  Lab Results  Component Value Date   HGBA1C 6.4 (H) 05/15/2016   Urine Drug Screen: No results found for: LABOPIA, COCAINSCRNUR, LABBENZ, AMPHETMU, THCU, LABBARB    IMAGING I have personally reviewed the radiological images below and agree with the radiology interpretations.  Dg Chest 2 View 05/14/2016 Emphysema without acute cardiopulmonary findings.   Ct Head Wo Contrast 05/14/2016 Atrophy and chronic ischemic changes.  No acute abnormality.   Ct Angio Head W Or Wo Contrast Ct Angio Neck W/cm &/or Wo/cm 05/15/2016 1. No large vessel occlusion or high-grade stenosis of the circle of Willis. 2. Severe 70% stable stenosis of the right proximal internal carotid artery. 3. Patent left mid common  carotid artery stent, small dissection flap proximal to the stent appears new from prior to stent placement, no interval study to determine chronicity. 4. 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 5. Stable moderate approximately 50% stenosis of left subclavian artery origin. 6. 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 Possible enlarged left-sided level 2a lymph node. Stable 5 mm nodule in the left parotid tail. 7. Mild bilateral upper lobe emphysema. 8. An incidental finding of potential clinical significance has been found. 6 mm left upper lobe ground-glass nodule.Initial follow-up with CT at 6-12 months is recommended to confirm persistence. If persistent, repeat CT is recommended every 2 years until 5 years of stability has been established.   Mr Brain Wo Contrast 05/15/2016 1. Punctate acute/early subacute infarct within the left precentral gyrus. 2. Mild parenchymal volume loss, moderate chronic microvascular ischemic changes, and small chronic infarcts in the left posterior frontal/parietal lobes. 3. Mild paranasal sinus disease.   TTE - pending    PHYSICAL EXAM  Temp:  [97.6 F (36.4 C)-98.7 F (37.1 C)] 97.6 F (36.4 C) (08/17 0902) Pulse Rate:  [66-82] 80 (08/17 0902) Resp:  [15-20] 20 (08/17 0902) BP: (135-173)/(51-95) 135/63 (08/17 0902) SpO2:  [94 %-98 %] 97 % (08/17  [id number])  General - Well nourished, well developed, in no apparent distress.  Ophthalmologic - Fundi not visualized due to noncooperation.  Cardiovascular - Regular rate and rhythm.  Mental Status -  Awake alert, orientated to self and husband and place, not orientated to time or president Language including expression, naming, repetition, comprehension was assessed and found intact.  Cranial Nerves II - XII - II - Visual field intact  OU. III, IV, VI - Extraocular movements intact. V - Facial sensation intact bilaterally. VII - Facial movement intact bilaterally. VIII - Hearing & vestibular intact bilaterally. X - Palate elevates symmetrically. XI - Chin turning & shoulder shrug intact bilaterally. XII - Tongue protrusion intact.  Motor Strength - The patient's strength was normal in all extremities and pronator drift was absent.  Bulk was normal and fasciculations were absent.   Motor Tone - Muscle tone was assessed at the neck and appendages and was normal.  Reflexes - The patient's reflexes were 1+ in all extremities and she had no pathological reflexes.  Sensory - Light touch, temperature/pinprick were assessed and were symmetrical.    Coordination - The patient had RUE ataxia on FTN out of proportion to weakness which is old as per husband.  Tremor was absent.  Gait and Station - deferred to PT due to safety concerns.  Dix-Hallpike test - questionable positive on the right but no nystagmus.   ASSESSMENT/PLAN Jasmine Arias is a 81 y.o. female with history of NIDDM, HTN, smoking, COPD and CVA after CEA was complicated by partial dissection last Nov now presenting with transient R leg weakness. She did not receive IV t-PA.   Stroke:  Questionable punctate left precentral gyrus infarct, etiology unclear, may related to possible small dissection flap proximal to the L CCA stent, however, intermal flap seems chronic   MRI  left punctate precentral gyrus infarct    CTA head & neck no significant large vessel disease COW. Severe stenoses R ICA 70%. Patent L CCA stent with small dissection flap proximal to stent. L ACA aneurysm 2-42mm. L SCA 50%.   2D Echo  pending   LDL 147  HgbA1c 6.4  Lovenox 30 mg sq daily for VTE prophylaxis Diet heart healthy/carb modified Room service appropriate? Yes; Fluid consistency: Thin  clopidogrel 75 mg daily prior to admission (treated with DAPT previously), now on  clopidogrel 75 mg daily. Given possible dissection add aspirin to plavix x 3 months and repeat CTA at the end.  Orthostatic vitals acceptable  Patient counseled to be compliant with her antithrombotic medications  Ongoing aggressive stroke risk factor management  Therapy recommendations:  Home PT  Disposition:  pending (lives with husband)  Follow up Dr. Erlinda Hong in 2 months, order written  Questionable BPPV on the right  PT suspect left unilateral hypofunction  Recommended HHPT for continued vestibular rehab and balance training  Dix Hallpike questionable positive on the right  Carotid stenosis, bilaterally  Right ICA 70% stenosis, unchanged - asymptomatic  Left ICA s/p CEA and CCA stenting  New left CCA small possible dissection flap proximal to CCA stent, seems chronic  Recommend DAPT for another 3 months and then repeat CTA neck at that time.  Hypertension  Elevated, 192/75  BP has not been low  In the 140s during rounds  Permissive hypertension (OK if < 220/120) but gradually normalize in 5-7 days  Long-term BP goal normotensive  orthostatic vitals - unremarkable.  Hyperlipidemia  Home meds:  No statin  LDL 147, goal <  53  Add statin lipitor 40 (had been on in the past and her doctor stopped it, she doesn't remember why)  Continue statin at discharge  Tobacco abuse  Current smoker  Smoking cessation counseling provided  Pt is willing to quit  Other Stroke Risk Factors  Advanced age  Hx stroke/TIA  12/2014 TIA - 90% L ICA stenosis, [id number] L CEA complicated by L MCA stroke from a L CCA partial dissection and thrombus to MCA. Slurred speech and R arm and leg weakness-> still has right UE weakness  Other Active Problems  Mixed Alzheiemr's and vascular baseline dementia, on aricept and namenda  Malnutrition, Body mass index is 21.54 kg/m.  COPD, on [facility] day # 0  Neurology will sign off. Please call with questions. Pt will follow up  with Dr. Erlinda Hong at [facility] in about 2 months. Thanks for the consult.  Rosalin Hawking, MD PhD Stroke Neurology 05/16/2016 5:25 PM     To contact Stroke Continuity provider, please refer to [email or web address] After hours, contact General Neurology

## 2016-05-16 NOTE — Progress Notes (Signed)
Physical Therapy Treatment Patient Details Name: Jasmine Arias MRN: [id number] DOB: Nov 04, 1934 Today's Date: 05/16/2016    History of Present Illness Jasmine RIDEAUX is a 81 y.o. 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    PT Comments    Patient limited some by c/o dizziness, but was already complaining prior to sitting up.  Spouse in room and seems to understand pt will need assist for safety and to use RW at home.  Feel she will need HHPT upon d/c and youth RW for safest walking device.   Follow Up Recommendations  Home health PT     Equipment Recommendations  Rolling walker with 5" wheels (youth size)    Recommendations for Other Services       Precautions / Restrictions Precautions Precautions: Fall    Mobility  Bed Mobility Overal bed mobility: Modified Independent                Transfers Overall transfer level: Needs assistance Equipment used: None Transfers: Sit to/from Stand Sit to Stand: Min guard         General transfer comment: for balance  Ambulation/Gait Ambulation/Gait assistance: Min assist Ambulation Distance (Feet): 220 Feet Assistive device: None (occasional use of railing) Gait Pattern/deviations: Step-through pattern;Narrow base of support;Drifts right/left;Shuffle     General Gait Details: attempted for pt to use cane, but she declined so walked with min A and occasional wall rail use.    Stairs            Wheelchair Mobility    Modified Rankin (Stroke Patients Only) Modified Rankin (Stroke Patients Only) Pre-Morbid Rankin Score: Moderate disability Modified Rankin: Moderately severe disability     Balance Overall balance assessment: Needs assistance           Standing balance-Leahy Scale: Fair                      Cognition Arousal/Alertness:  Awake/alert Behavior During Therapy: WFL for tasks assessed/performed Overall Cognitive Status: History of cognitive impairments - at baseline       Memory: Decreased short-term memory              Exercises      General Comments General comments (skin integrity, edema, etc.): Spoke with patient's spouse about using walker, level of assist for safety and need for continued PT at home.        Pertinent Vitals/Pain Pain Assessment: No/denies pain    Home Living                      Prior Function            PT Goals (current goals can now be found in the care plan section) Progress towards PT goals: Progressing toward goals    Frequency  Min 3X/week    PT Plan Current plan remains appropriate;Equipment recommendations need to be updated    Co-evaluation             End of Session Equipment Utilized During Treatment: Gait belt Activity Tolerance: Patient tolerated treatment well;Other (comment) (with c/o dizziness) Patient left: in bed;with call bell/phone within reach;with family/visitor present;with bed alarm set     Time: [id number] PT Time Calculation (min) (ACUTE ONLY): 23 min  Charges:  $Gait Training: 8-22 mins $Self Care/Home Management: 8-22  G CodesReginia Naas 05/16/2016, 2:07 PM  Magda Kiel, [place] 05/16/2016

## 2016-05-16 NOTE — Progress Notes (Signed)
Patient Demographics:    Jasmine Arias, is a 81 y.o. female, DOB - [birth year], [id number]  Admit date - 05/14/2016   Admitting Physician Edwin Dada, MD  Outpatient Primary MD for the patient is Park Liter, DO  LOS - 0   Chief Complaint  Patient presents with  . Weakness  . Stroke Symptoms        Subjective:    Jasmine Arias today has no fevers, no emesis,  No chest pain,  Or new concerns,    Assessment  & Plan :    Principal Problem:   Right leg weakness Active Problems:   Malnutrition ([place])   Type 2 diabetes mellitus with background retinopathy ([place])   Essential hypertension   Compulsive tobacco user syndrome   Mixed Alzheimer's and vascular dementia   COPD (chronic obstructive pulmonary disease) (HCC)   History of stroke   Cerebrovascular accident (CVA) due to embolism of left anterior cerebral artery (HCC)   H/O endarterectomy   CVA (cerebral infarction)   1)Acute CVA Punctate CVA - no new deficits, neurologist advised holding off on discharge until echocardiogram is done. continue aspirin, Discussed with Neurologist,  patient will most likely need, repeat CTA head in 3 months, patient will also need aspirin and Plavix combo for 3 months and then Plavix only after that. PT OT eval   2)HTN- Allow some permissive Hypertension due to acute stroke, avoid very aggressive, rapid BP control at this time.   Code Status : full   Disposition Plan  : d/c after echocardiogram is done  Consults  :  neuro   DVT Prophylaxis  :  Lovenox -   Lab Results  Component Value Date   PLT 190 05/14/2016    Inpatient Medications  Scheduled Meds: . arformoterol  15 mcg Nebulization BID  . aspirin EC  325 mg Oral Daily  . atorvastatin  40 mg Oral q1800  . clopidogrel  75 mg Oral Q breakfast  . donepezil  10 mg Oral QHS  . enoxaparin (LOVENOX) injection  30 mg Subcutaneous  Q24H  . feeding supplement (ENSURE ENLIVE)  237 mL Oral BID  . memantine  5 mg Oral BID  . metoprolol tartrate  12.5 mg Oral BID  . umeclidinium bromide  1 puff Inhalation Daily   Continuous Infusions:  PRN Meds:.acetaminophen **OR** acetaminophen, albuterol    Anti-infectives    None        Objective:   Vitals:   05/16/16 0503 05/16/16 0840 05/16/16 0902 05/16/16 1611  BP: (!) 142/51  135/63 (!) 129/57  Pulse: 69  80 60  Resp: 16  20 20   Temp: 98.5 F (36.9 C)  97.6 F (36.4 C) 98.6 F (37 C)  TempSrc: Oral  Oral Oral  SpO2: 95% 94% 97% 97%  Weight:      Height:        Wt Readings from Last 3 Encounters:  05/15/16 51.7 kg (114 lb)  04/05/16 48.3 kg (106 lb 6.4 oz)  02/27/16 46.7 kg (103 lb)     Intake/Output Summary (Last 24 hours) at 05/16/16 1803 Last data filed at 05/16/16 0854  Gross per 24 hour  Intake  360 ml  Output                0 ml  Net              360 ml     Physical Exam  Gen:- Awake Alert,   HEENT:- [place].AT, No sclera icterus Neck-Supple Neck,No JVD,.  Lungs-  CTAB  CV- S1, S2 normal Abd-  +ve B.Sounds, Abd Soft, No tenderness,    Extremity/Skin:- No  edema,    Neuro-improved right lower extremity weakness    Data Review:   Micro Results No results found for this or any previous visit (from the past 240 hour(s)).  Radiology Reports Ct Angio Head W Or Wo Contrast  Result Date: 05/15/2016 CLINICAL DATA:  81 y/o F; weakness and mental status changes. History of prior stroke with right-sided weakness. History of Alzheimer's disease. History of left carotid endarterectomy. EXAM: CT ANGIOGRAPHY HEAD AND NECK TECHNIQUE: Multidetector CT imaging of the head and neck was performed using the standard protocol during bolus administration of intravenous contrast. Multiplanar CT image reconstructions and MIPs were obtained to evaluate the vascular anatomy. Carotid stenosis measurements (when applicable) are obtained utilizing NASCET  criteria, using the distal internal carotid diameter as the denominator. CONTRAST:  50 cc Isovue 370. COMPARISON:  CT of head dated 05/14/2016. MRI of brain dated 08/12/2015. CT angiogram of the neck dated 08/11/2015. FINDINGS: CTA NECK Aortic arch: 3 vessel aortic arch with severe calcific atherosclerosis. Concentric calcification of the left subclavian artery origin with moderate approximately 50% stenosis. Right carotid system: New patent right common carotid artery with mild calcified plaque. Dense calcifications of the right carotid bifurcation with severe 70% stenosis of proximal ICA. The ICA is otherwise patent. Left carotid system: Small dissection flap just downstream to the origin of the left common carotid artery series 401, image 19. Patent stent within the mid left common carotid artery. No significant stenosis of the carotid bifurcation or internal carotid artery. Vertebral arteries:Right vertebral artery calcified plaque at the C6 level series 401, image 61 with probable moderate associated stenosis. No significant stenosis of the left vertebral artery. Skeleton: Moderate degenerative changes of the cervical spine without high-grade canal stenosis or foraminal narrowing. Other neck: Multiple thyroid nodules the largest in the left lobe measuring 6 mm. Stable enhancing mass within the superficial lobe of the left parotid gland with some extension into the deep lobecondyle measuring 17 x 25 x 47 mm (AP x ML x CC), series 401, image 7 the 8 and series 403, image 114. Deep to the enhancing mass is a 9 x 14 mm nodule in the expected location of the jugulodigastric lymph node IIa series 401, image 71 which may represent associated lymphadenopathy. Additional stable enhancing nodule within the left parotid tail measuring 5 mm. Upper chest: Mild centrilobular emphysema of the lung apices bilaterally. Left upper lobe a 6 mm ground-glass nodule series 401, image 12. CTA HEAD Anterior circulation: Moderate  calcific atherosclerosis of cavernous internal carotid arteries without significant stenosis. Bilateral middle cerebral arteries, anterior cerebral arteries, and anterior communicating artery are patent. No high-grade stenosis or large vessel occlusion is identified. 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 Posterior circulation: Small bilateral P1 segments with large posterior communicating arteries consistent with persistent fetal circulation. Bilateral V4 segments, the basilar artery, and posterior cerebral arteries are patent. No occlusion, aneurysm, dissection, or significant stenosis is identified. Venous sinuses: Patent.  Anatomic variants: Bilateral fetal PCAs. Delayed phase: No abnormal enhancement of the brain parenchyma. Unchanged left frontal bone osteoma. Mild parenchymal volume loss and moderate chronic microvascular ischemic changes. No hydrocephalus. Chronic left frontal infarct. No intracranial hemorrhage. IMPRESSION: 1. No large vessel occlusion or high-grade stenosis of the circle of Willis. 2. Severe 70% stable stenosis of the right proximal internal carotid artery. 3. Patent left mid common carotid artery stent, small dissection flap proximal to the stent appears new from prior to stent placement, no interval study to determine chronicity. 4. 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 5. Stable moderate approximately 50% stenosis of left subclavian artery origin. 6. 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 Possible enlarged left-sided level 2a lymph node. Stable 5 mm nodule in the left parotid tail. 7. Mild bilateral upper lobe emphysema. 8. **An incidental finding of potential clinical significance has been found. 6 mm left upper  lobe ground-glass nodule.Initial follow-up with CT at 6-12 months is recommended to confirm persistence. If persistent, repeat CT is recommended every 2 years until 5 years of stability has been established. This recommendation follows the consensus statement: Guidelines for Management of Incidental Pulmonary Nodules Detected on CT Images:From the Fleischner Society 2017; published online before print (10.1148/radiol.IJ:2314499).** These results will be called to the ordering clinician or representative by the Radiologist Assistant, and communication documented in the PACS or zVision Dashboard. Electronically Signed   By: Kristine Garbe M.D.   On: 05/15/2016 05:57   Dg Chest 2 View  Result Date: 05/14/2016 CLINICAL DATA:  Two-day history of cough and right-sided weakness. EXAM: CHEST  2 VIEW COMPARISON:  09/08/2015 FINDINGS: Frontal film is markedly rotated. Lungs are hyperexpanded without focal airspace consolidation or overt pulmonary edema. No pleural effusion or pneumothorax evident. Cardiopericardial silhouette is at upper limits of normal for size. Vascular stent device noted in the region of the left thoracic inlet. The visualized bony structures of the thorax are intact. Telemetry leads overlie the chest. IMPRESSION: Emphysema without acute cardiopulmonary findings. Electronically Signed   By: Misty Stanley M.D.   On: 05/14/2016 21:39   Ct Head Wo Contrast  Result Date: 05/14/2016 CLINICAL DATA:  Right leg weakness. EXAM: CT HEAD WITHOUT CONTRAST TECHNIQUE: Contiguous axial images were obtained from the base of the skull through the vertex without intravenous contrast. COMPARISON:  MRI 08/12/2015 FINDINGS: Brain: Moderate atrophy. Chronic ischemic changes in the white matter. Chronic left MCA infarct over the convexity. Negative for acute infarct. Negative for hemorrhage or mass. Vascular: Mild atherosclerotic calcification in the carotid and vertebral arteries. Skull: Benign-appearing bony  exostosis left frontal bone. No acute skull abnormality Sinuses/Orbits: Negative Other: Negative IMPRESSION: Atrophy and chronic ischemic changes.  No acute abnormality. Electronically Signed   By: Franchot Gallo M.D.   On: 05/14/2016 21:34   Ct Angio Neck W/cm &/or Wo/cm  Result Date: 05/15/2016 CLINICAL DATA:  81 y/o F; weakness and mental status changes. History of prior stroke with right-sided weakness. History of Alzheimer's disease. History of left carotid endarterectomy. EXAM: CT ANGIOGRAPHY HEAD AND NECK TECHNIQUE: Multidetector CT imaging of the head and neck was performed using the standard protocol during bolus administration of intravenous contrast. Multiplanar CT image reconstructions and MIPs were obtained to evaluate the vascular anatomy. Carotid stenosis measurements (when applicable) are obtained utilizing NASCET criteria, using the distal internal carotid  diameter as the denominator. CONTRAST:  50 cc Isovue 370. COMPARISON:  CT of head dated 05/14/2016. MRI of brain dated 08/12/2015. CT angiogram of the neck dated 08/11/2015. FINDINGS: CTA NECK Aortic arch: 3 vessel aortic arch with severe calcific atherosclerosis. Concentric calcification of the left subclavian artery origin with moderate approximately 50% stenosis. Right carotid system: New patent right common carotid artery with mild calcified plaque. Dense calcifications of the right carotid bifurcation with severe 70% stenosis of proximal ICA. The ICA is otherwise patent. Left carotid system: Small dissection flap just downstream to the origin of the left common carotid artery series 401, image 19. Patent stent within the mid left common carotid artery. No significant stenosis of the carotid bifurcation or internal carotid artery. Vertebral arteries:Right vertebral artery calcified plaque at the C6 level series 401, image 61 with probable moderate associated stenosis. No significant stenosis of the left vertebral artery. Skeleton: Moderate  degenerative changes of the cervical spine without high-grade canal stenosis or foraminal narrowing. Other neck: Multiple thyroid nodules the largest in the left lobe measuring 6 mm. Stable enhancing mass within the superficial lobe of the left parotid gland with some extension into the deep lobecondyle measuring 17 x 25 x 47 mm (AP x ML x CC), series 401, image 7 the 8 and series 403, image 114. Deep to the enhancing mass is a 9 x 14 mm nodule in the expected location of the jugulodigastric lymph node IIa series 401, image 71 which may represent associated lymphadenopathy. Additional stable enhancing nodule within the left parotid tail measuring 5 mm. Upper chest: Mild centrilobular emphysema of the lung apices bilaterally. Left upper lobe a 6 mm ground-glass nodule series 401, image 12. CTA HEAD Anterior circulation: Moderate calcific atherosclerosis of cavernous internal carotid arteries without significant stenosis. Bilateral middle cerebral arteries, anterior cerebral arteries, and anterior communicating artery are patent. No high-grade stenosis or large vessel occlusion is identified. 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 Posterior circulation: Small bilateral P1 segments with large posterior communicating arteries consistent with persistent fetal circulation. Bilateral V4 segments, the basilar artery, and posterior cerebral arteries are patent. No occlusion, aneurysm, dissection, or significant stenosis is identified. Venous sinuses: Patent. Anatomic variants: Bilateral fetal PCAs. Delayed phase: No abnormal enhancement of the brain parenchyma. Unchanged left frontal bone osteoma. Mild parenchymal volume loss and moderate chronic microvascular ischemic changes. No hydrocephalus. Chronic left frontal infarct. No intracranial hemorrhage. IMPRESSION: 1. No large vessel occlusion or high-grade stenosis of the circle of  Willis. 2. Severe 70% stable stenosis of the right proximal internal carotid artery. 3. Patent left mid common carotid artery stent, small dissection flap proximal to the stent appears new from prior to stent placement, no interval study to determine chronicity. 4. 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 5. Stable moderate approximately 50% stenosis of left subclavian artery origin. 6. 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 Possible enlarged left-sided level 2a lymph node. Stable 5 mm nodule in the left parotid tail. 7. Mild bilateral upper lobe emphysema. 8. **An incidental finding of potential clinical significance has been found. 6 mm left upper lobe ground-glass nodule.Initial follow-up with CT at 6-12 months is recommended to confirm persistence. If persistent, repeat CT is recommended every 2 years until 5 years  of stability has been established. This recommendation follows the consensus statement: Guidelines for Management of Incidental Pulmonary Nodules Detected on CT Images:From the Fleischner Society 2017; published online before print (10.1148/radiol.IJ:2314499).** These results will be called to the ordering clinician or representative by the Radiologist Assistant, and communication documented in the PACS or zVision Dashboard. Electronically Signed   By: Kristine Garbe M.D.   On: 05/15/2016 05:57   Mr Brain Wo Contrast  Addendum Date: 05/15/2016   ADDENDUM REPORT: 05/15/2016 06:46 ADDENDUM: These results were called by telephone at the time of interpretation on 05/15/2016 at 6:46 am to Dr. Myrene Buddy , who verbally acknowledged these results. Electronically Signed   By: Kristine Garbe M.D.   On: 05/15/2016 06:46   Result Date: 05/15/2016 CLINICAL DATA:  81 y/o F; history of cerebral vascular  accident after carotid endarterectomy complicated by partial dissection last November presenting with transient right leg weakness. EXAM: MRI HEAD WITHOUT CONTRAST TECHNIQUE: Multiplanar, multiecho pulse sequences of the brain and surrounding structures were obtained without intravenous contrast. COMPARISON:  CTA of the head dated 05/15/2016. MRI of the brain dated 08/12/2015. FINDINGS: Brain: Punctate focus of diffusion hyperintensity within the left precentral gyrus with probable low ADC is likely to represent an acute/early subacute infarct. Additional small foci of diffusion hyperintensity left posterior frontal centrum semiovale do not demonstrate definite low ADC and are probably T2 shine through. No abnormal susceptibility hypointensity to indicate intracranial hemorrhage. There is mild diffuse parenchymal volume loss and moderate chronic microvascular ischemic changes as wall is several small chronic cortical infarcts in the left posterior frontal and parietal lobes. Extra-axial space: Normal ventricular size. No midline shift. No effacement of basilar cisterns. No extra-axial collection is identified. Proximal intracranial flow voids are maintained. No abnormality of the cervical medullary junction. Cavum septum pellucidum. Other: Left maxillary sinus mucous retention cyst and mild mucosal thickening of the ethmoid air cells. No abnormal signal of the mastoid air cells. Orbits are unremarkable. Stable left frontal bone osteoma, otherwise the calvarium is unremarkable. IMPRESSION: 1. Punctate acute/early subacute infarct within the left precentral gyrus. 2. Mild parenchymal volume loss, moderate chronic microvascular ischemic changes, and small chronic infarcts in the left posterior frontal/parietal lobes. 3. Mild paranasal sinus disease. Electronically Signed: By: Kristine Garbe M.D. On: 05/15/2016 06:17     CBC  Recent Labs Lab 05/14/16 2016  WBC 5.2  HGB 12.8  HCT 41.2  PLT 190  MCV  89.2  MCH 27.7  MCHC 31.1  RDW 15.2  LYMPHSABS 1.4  MONOABS 0.4  EOSABS 0.1  BASOSABS 0.0    Chemistries   Recent Labs Lab 05/14/16 2016  NA 139  K 3.3*  CL 107  CO2 26  GLUCOSE 79  BUN 11  CREATININE 0.95  CALCIUM 9.0  AST 15  ALT 12*  ALKPHOS 76  BILITOT 0.4   ------------------------------------------------------------------------------------------------------------------  Recent Labs  05/15/16 0600  CHOL 218*  HDL 56  LDLCALC 147*  TRIG 76  CHOLHDL 3.9    Lab Results  Component Value Date   HGBA1C 6.4 (H) 05/15/2016   ------------------------------------------------------------------------------------------------------------------ No results for input(s): TSH, T4TOTAL, T3FREE, THYROIDAB in the last 72 hours.  Invalid input(s): FREET3 ------------------------------------------------------------------------------------------------------------------ No results for input(s): VITAMINB12, FOLATE, FERRITIN, TIBC, IRON, RETICCTPCT in the last 72 hours.  Coagulation profile No results for input(s): INR, PROTIME in the last 168 hours.  No results for input(s): DDIMER in the last 72 hours.  Cardiac Enzymes No results for input(s): CKMB, TROPONINI, MYOGLOBIN in  the last 168 hours.  Invalid input(s): CK ------------------------------------------------------------------------------------------------------------------ No results found for: BNP   Simya Tercero M.D on 05/16/2016 at 6:03 PM  Between 7am to 7pm - Pager - [phone]  After 7pm go to [email or web address] - password [email or web address]  [facility] -  Office  [phone]  Dragon dictation system was used to create this note, attempts have been made to correct errors, however presence of uncorrected errors is not a reflection quality of care provided

## 2016-05-16 NOTE — Progress Notes (Signed)
Patient stated to nurse tech that she felt very dizzy. She was sitting up eating lunch in bed, nurse tech says patient was sitting in bed when she began complaining of dizziness, no positional changes.  RN obtained orthostatic vital signs, CBG was 129, asked patient to lay flat, focus on one point. Neuro assessment unchanged. After assessment, patient stated she didn't feel very dizzy anymore. Does not want to stay flat in bed. Family friends are at patient's bedside. Patient sitting up speaking with them. She states the dizziness can happen every so often at home.  NP Burnetta Sabin was paged.

## 2016-05-16 NOTE — Progress Notes (Signed)
PT Vestibular Eval    05/16/16 1453  Symptom Behavior  Type of Dizziness Lightheadedness  Frequency of Dizziness intermittent  Duration of Dizziness with motion and few minutes after  Aggravating Factors Sit to stand;Activity in general  Relieving Factors Rest  Occulomotor Exam  Occulomotor Alignment Normal  Spontaneous Absent  Gaze-induced Right beating nystagmus with R gaze  Smooth Pursuits Intact  Saccades Intact;Slow  Vestibulo-Occular Reflex  VOR 1 Head Only (x 1 viewing) difficutly following directions for testing, so performed with assist and able to perform with cues, no slipping  VOR to Slow Head Movement Positive bilaterally  Auditory  Comments intact and equal to scratch test bilateral  Positional Testing  Sidelying Test Sidelying Right;Sidelying Left  Horizontal Canal Testing Horizontal Canal Right;Horizontal Canal Left  Sidelying Right  Sidelying Right Duration 30 sec  Sidelying Right Symptoms No nystagmus  Sidelying Left  Sidelying Left Duration 30 sec  Sidelying Left Symptoms No nystagmus  Horizontal Canal Right  Horizontal Canal Right Duration 30 sec  Horizontal Canal Right Symptoms Normal  Horizontal Canal Left  Horizontal Canal Left Duration 30 sec  Horizontal Canal Left Symptoms Normal   Patient with symptoms consistent with L unilateral hypofunction.  Noted no positional symptoms.  Feel she can follow up with HHPT for continued vestibular rehab and balance training at d/c. No HEP started as family not here and pt with dementia.   [id number] 16 min Charges: [place], [place] 05/16/2016

## 2016-05-16 NOTE — Progress Notes (Signed)
Dr. Lavera Guise came to patient's bedside. Per MD, PT vestibular consult. PT was notified.

## 2016-05-17 ENCOUNTER — Inpatient Hospital Stay (HOSPITAL_COMMUNITY): Payer: Medicare Other

## 2016-05-17 ENCOUNTER — Other Ambulatory Visit (HOSPITAL_COMMUNITY): Payer: Medicare Other

## 2016-05-17 DIAGNOSIS — G459 Transient cerebral ischemic attack, unspecified: Secondary | ICD-10-CM

## 2016-05-17 LAB — GLUCOSE, CAPILLARY
GLUCOSE-CAPILLARY: 108 mg/dL — AB (ref 65–99)
Glucose-Capillary: 123 mg/dL — ABNORMAL HIGH (ref 65–99)
Glucose-Capillary: 85 mg/dL (ref 65–99)
Glucose-Capillary: 176 mg/dL — ABNORMAL HIGH (ref 65–99)

## 2016-05-17 LAB — ECHOCARDIOGRAM COMPLETE
Height: 61 in
WEIGHTICAEL: 1824 [oz_av]

## 2016-05-17 NOTE — Progress Notes (Signed)
CM discussed PT recommendation with pt and spouse, provided list of agencies in [facility] and requested pt choose one.  Her husband then said he did not want CM to arrange home health services because he needed to talk with pt's doctor first.  Attempted to discuss with him but he refused, kept repeating he needed to talk with the doctor before anything was arranged.

## 2016-05-17 NOTE — Progress Notes (Signed)
Patient Demographics:    Jasmine Arias, is a 81 y.o. female, DOB - Nov 08, 1934, [id number]  Admit date - 05/14/2016   Admitting Physician Edwin Dada, MD  Outpatient Primary MD for the patient is Park Liter, DO  LOS - 1   Chief Complaint  Patient presents with  . Weakness  . Stroke Symptoms        Subjective:    Jasmine Arias today has no fevers, no emesis,  No chest pain,  Not cooperative, Confused and disoriented   Assessment  & Plan :    Principal Problem:   Right leg weakness Active Problems:   Malnutrition ([place])   Type 2 diabetes mellitus with background retinopathy ([place])   Essential hypertension   Compulsive tobacco user syndrome   Mixed Alzheimer's and vascular dementia   COPD (chronic obstructive pulmonary disease) (HCC)   History of stroke   Cerebrovascular accident (CVA) due to embolism of left anterior cerebral artery (HCC)   H/O endarterectomy   CVA (cerebral infarction)   1)Acute CVA Punctate CVA - no new deficits, neurologist advised holding off on discharge until echocardiogram is done. continue aspirin, Discussed with Neurologist, patient will most likely need, repeat CTA head in 3 months,patient will also need aspirin and Plavix combo for 3 months and then Plavix only after that. PT OT eval appreciated, echocardiogram with preserved EF over 65%, no intracardiac thrombosis, patient does have grade 1 diastolic dysfunction without any regional wall motion abnormalities  2)HTN- Allow some permissive Hypertension due to acute stroke, avoid very aggressive, rapid BP control at this time.   3)Social- patient wants to go home, however she is confused with cognitive deficits, patient's husband and family states that they unable to take care of patient at home, social worker trying to look into SNF   for subacute rehabilitation  Disposition Plan  : SNF   Consults   :  Neuro/social work    DVT Prophylaxis  :  Lovenox   Lab Results  Component Value Date   PLT 190 05/14/2016    Inpatient Medications  Scheduled Meds: . arformoterol  15 mcg Nebulization BID  . aspirin EC  325 mg Oral Daily  . atorvastatin  40 mg Oral q1800  . clopidogrel  75 mg Oral Q breakfast  . donepezil  10 mg Oral QHS  . enoxaparin (LOVENOX) injection  30 mg Subcutaneous Q24H  . feeding supplement (ENSURE ENLIVE)  237 mL Oral BID  . memantine  5 mg Oral BID  . metoprolol tartrate  12.5 mg Oral BID  . umeclidinium bromide  1 puff Inhalation Daily   Continuous Infusions:  PRN Meds:.acetaminophen **OR** acetaminophen, albuterol    Anti-infectives    None        Objective:   Vitals:   05/17/16 0159 05/17/16 0612 05/17/16 1010 05/17/16 1341  BP: (!) 113/57 (!) 141/75 137/65 111/65  Pulse: 72 87 79 79  Resp: 16 16 18 18   Temp: 97.7 F (36.5 C) 98.7 F (37.1 C) 97.9 F (36.6 C) 99.1 F (37.3 C)  TempSrc: Oral Oral Oral Oral  SpO2: 96% 96% 98%   Weight:      Height:        Wt Readings from Last 3 Encounters:  05/15/16 51.7 kg (114 lb)  04/05/16 48.3 kg (106 lb 6.4 oz)  02/27/16 46.7 kg (103 lb)     Intake/Output Summary (Last 24 hours) at 05/17/16 1659 Last data filed at 05/17/16 1300  Gross per 24 hour  Intake              680 ml  Output                0 ml  Net              680 ml     Physical Exam  Gen:- Awake , confused HEENT:- [place].AT, No sclera icterus Neck-Supple Neck,No JVD,.  Lungs-  CTAB  CV- S1, S2 normal Abd-  +ve B.Sounds, Abd Soft, No tenderness,    Extremity/Skin:- No  edema,    NeuroPsych- confusional episodes persist, occasional combativeness and agitation, however no new additional  focal neurological deficits, patient remains very unsteady,    Data Review:   Micro Results No results found for this or any previous visit (from the past 240 hour(s)).  Radiology Reports Ct Angio Head W Or Wo Contrast  Result Date:  05/15/2016 CLINICAL DATA:  82 y/o F; weakness and mental status changes. History of prior stroke with right-sided weakness. History of Alzheimer's disease. History of left carotid endarterectomy. EXAM: CT ANGIOGRAPHY HEAD AND NECK TECHNIQUE: Multidetector CT imaging of the head and neck was performed using the standard protocol during bolus administration of intravenous contrast. Multiplanar CT image reconstructions and MIPs were obtained to evaluate the vascular anatomy. Carotid stenosis measurements (when applicable) are obtained utilizing NASCET criteria, using the distal internal carotid diameter as the denominator. CONTRAST:  50 cc Isovue 370. COMPARISON:  CT of head dated 05/14/2016. MRI of brain dated 08/12/2015. CT angiogram of the neck dated 08/11/2015. FINDINGS: CTA NECK Aortic arch: 3 vessel aortic arch with severe calcific atherosclerosis. Concentric calcification of the left subclavian artery origin with moderate approximately 50% stenosis. Right carotid system: New patent right common carotid artery with mild calcified plaque. Dense calcifications of the right carotid bifurcation with severe 70% stenosis of proximal ICA. The ICA is otherwise patent. Left carotid system: Small dissection flap just downstream to the origin of the left common carotid artery series 401, image 19. Patent stent within the mid left common carotid artery. No significant stenosis of the carotid bifurcation or internal carotid artery. Vertebral arteries:Right vertebral artery calcified plaque at the C6 level series 401, image 61 with probable moderate associated stenosis. No significant stenosis of the left vertebral artery. Skeleton: Moderate degenerative changes of the cervical spine without high-grade canal stenosis or foraminal narrowing. Other neck: Multiple thyroid nodules the largest in the left lobe measuring 6 mm. Stable enhancing mass within the superficial lobe of the left parotid gland with some extension into the  deep lobecondyle measuring 17 x 25 x 47 mm (AP x ML x CC), series 401, image 7 the 8 and series 403, image 114. Deep to the enhancing mass is a 9 x 14 mm nodule in the expected location of the jugulodigastric lymph node IIa series 401, image 71 which may represent associated lymphadenopathy. Additional stable enhancing nodule within the left parotid tail measuring 5 mm. Upper chest: Mild centrilobular emphysema of the lung apices bilaterally. Left upper lobe a 6 mm ground-glass nodule series 401, image 12. CTA HEAD Anterior circulation: Moderate calcific atherosclerosis of cavernous internal carotid arteries without significant stenosis. Bilateral middle cerebral arteries, anterior cerebral arteries, and anterior communicating artery are  patent. No high-grade stenosis or large vessel occlusion is identified. 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 Posterior circulation: Small bilateral P1 segments with large posterior communicating arteries consistent with persistent fetal circulation. Bilateral V4 segments, the basilar artery, and posterior cerebral arteries are patent. No occlusion, aneurysm, dissection, or significant stenosis is identified. Venous sinuses: Patent. Anatomic variants: Bilateral fetal PCAs. Delayed phase: No abnormal enhancement of the brain parenchyma. Unchanged left frontal bone osteoma. Mild parenchymal volume loss and moderate chronic microvascular ischemic changes. No hydrocephalus. Chronic left frontal infarct. No intracranial hemorrhage. IMPRESSION: 1. No large vessel occlusion or high-grade stenosis of the circle of Willis. 2. Severe 70% stable stenosis of the right proximal internal carotid artery. 3. Patent left mid common carotid artery stent, small dissection flap proximal to the stent appears new from prior to stent placement, no interval study to determine chronicity. 4. 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 5. Stable moderate approximately 50% stenosis of left subclavian artery origin. 6. 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 Possible enlarged left-sided level 2a lymph node. Stable 5 mm nodule in the left parotid tail. 7. Mild bilateral upper lobe emphysema. 8. **An incidental finding of potential clinical significance has been found. 6 mm left upper lobe ground-glass nodule.Initial follow-up with CT at 6-12 months is recommended to confirm persistence. If persistent, repeat CT is recommended every 2 years until 5 years of stability has been established. This recommendation follows the consensus statement: Guidelines for Management of Incidental Pulmonary Nodules Detected on CT Images:From the Fleischner Society 2017; published online before print (10.1148/radiol.IJ:2314499).** These results will be called to the ordering clinician or representative by the Radiologist Assistant, and communication documented in the PACS or zVision Dashboard. Electronically Signed   By: Kristine Garbe M.D.   On: 05/15/2016 05:57   Dg Chest 2 View  Result Date: 05/14/2016 CLINICAL DATA:  Two-day history of cough and right-sided weakness. EXAM: CHEST  2 VIEW COMPARISON:  09/08/2015 FINDINGS: Frontal film is markedly rotated. Lungs are hyperexpanded without focal airspace consolidation or overt pulmonary edema. No pleural effusion or pneumothorax evident. Cardiopericardial silhouette is at upper limits of normal for size. Vascular stent device noted in the region of the left thoracic inlet. The visualized bony structures of the thorax are intact. Telemetry leads overlie the chest. IMPRESSION: Emphysema without acute cardiopulmonary findings. Electronically Signed   By: Misty Stanley M.D.   On: 05/14/2016 21:39   Ct Head Wo  Contrast  Result Date: 05/14/2016 CLINICAL DATA:  Right leg weakness. EXAM: CT HEAD WITHOUT CONTRAST TECHNIQUE: Contiguous axial images were obtained from the base of the skull through the vertex without intravenous contrast. COMPARISON:  MRI 08/12/2015 FINDINGS: Brain: Moderate atrophy. Chronic ischemic changes in the white matter. Chronic left MCA infarct over the convexity. Negative for acute infarct. Negative for hemorrhage or mass. Vascular: Mild atherosclerotic calcification in the carotid and vertebral arteries. Skull: Benign-appearing bony exostosis left frontal bone. No acute skull abnormality Sinuses/Orbits: Negative Other: Negative IMPRESSION: Atrophy and chronic ischemic changes.  No acute abnormality. Electronically Signed   By: Franchot Gallo M.D.   On: 05/14/2016 21:34   Ct Angio Neck W/cm &/or Wo/cm  Result Date: 05/15/2016 CLINICAL DATA:  81 y/o F; weakness and mental status changes.  History of prior stroke with right-sided weakness. History of Alzheimer's disease. History of left carotid endarterectomy. EXAM: CT ANGIOGRAPHY HEAD AND NECK TECHNIQUE: Multidetector CT imaging of the head and neck was performed using the standard protocol during bolus administration of intravenous contrast. Multiplanar CT image reconstructions and MIPs were obtained to evaluate the vascular anatomy. Carotid stenosis measurements (when applicable) are obtained utilizing NASCET criteria, using the distal internal carotid diameter as the denominator. CONTRAST:  50 cc Isovue 370. COMPARISON:  CT of head dated 05/14/2016. MRI of brain dated 08/12/2015. CT angiogram of the neck dated 08/11/2015. FINDINGS: CTA NECK Aortic arch: 3 vessel aortic arch with severe calcific atherosclerosis. Concentric calcification of the left subclavian artery origin with moderate approximately 50% stenosis. Right carotid system: New patent right common carotid artery with mild calcified plaque. Dense calcifications of the right carotid  bifurcation with severe 70% stenosis of proximal ICA. The ICA is otherwise patent. Left carotid system: Small dissection flap just downstream to the origin of the left common carotid artery series 401, image 19. Patent stent within the mid left common carotid artery. No significant stenosis of the carotid bifurcation or internal carotid artery. Vertebral arteries:Right vertebral artery calcified plaque at the C6 level series 401, image 61 with probable moderate associated stenosis. No significant stenosis of the left vertebral artery. Skeleton: Moderate degenerative changes of the cervical spine without high-grade canal stenosis or foraminal narrowing. Other neck: Multiple thyroid nodules the largest in the left lobe measuring 6 mm. Stable enhancing mass within the superficial lobe of the left parotid gland with some extension into the deep lobecondyle measuring 17 x 25 x 47 mm (AP x ML x CC), series 401, image 7 the 8 and series 403, image 114. Deep to the enhancing mass is a 9 x 14 mm nodule in the expected location of the jugulodigastric lymph node IIa series 401, image 71 which may represent associated lymphadenopathy. Additional stable enhancing nodule within the left parotid tail measuring 5 mm. Upper chest: Mild centrilobular emphysema of the lung apices bilaterally. Left upper lobe a 6 mm ground-glass nodule series 401, image 12. CTA HEAD Anterior circulation: Moderate calcific atherosclerosis of cavernous internal carotid arteries without significant stenosis. Bilateral middle cerebral arteries, anterior cerebral arteries, and anterior communicating artery are patent. No high-grade stenosis or large vessel occlusion is identified. 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 Posterior circulation: Small bilateral P1 segments with large posterior communicating arteries consistent with persistent fetal circulation.  Bilateral V4 segments, the basilar artery, and posterior cerebral arteries are patent. No occlusion, aneurysm, dissection, or significant stenosis is identified. Venous sinuses: Patent. Anatomic variants: Bilateral fetal PCAs. Delayed phase: No abnormal enhancement of the brain parenchyma. Unchanged left frontal bone osteoma. Mild parenchymal volume loss and moderate chronic microvascular ischemic changes. No hydrocephalus. Chronic left frontal infarct. No intracranial hemorrhage. IMPRESSION: 1. No large vessel occlusion or high-grade stenosis of the circle of Willis. 2. Severe 70% stable stenosis of the right proximal internal carotid artery. 3. Patent left mid common carotid artery stent, small dissection flap proximal to the stent appears new from prior to stent placement, no interval study to determine chronicity. 4. 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 5. Stable moderate approximately 50% stenosis of left subclavian artery origin. 6. 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 Possible enlarged left-sided level 2a lymph node. Stable 5 mm nodule in the left parotid tail. 7. Mild bilateral upper lobe emphysema. 8. **An incidental finding of potential clinical significance has been found. 6 mm left upper lobe ground-glass nodule.Initial follow-up with CT at 6-12 months is recommended to confirm persistence. If persistent, repeat CT is recommended every 2 years until 5 years of stability has been established. This recommendation follows the consensus statement: Guidelines for Management of Incidental Pulmonary Nodules Detected on CT Images:From the Fleischner Society 2017; published online before print (10.1148/radiol.IJ:2314499).** These results will be called to the ordering clinician or representative by the Radiologist Assistant, and  communication documented in the PACS or zVision Dashboard. Electronically Signed   By: Kristine Garbe M.D.   On: 05/15/2016 05:57   Mr Brain Wo Contrast  Addendum Date: 05/15/2016   ADDENDUM REPORT: 05/15/2016 06:46 ADDENDUM: These results were called by telephone at the time of interpretation on 05/15/2016 at 6:46 am to Dr. Myrene Buddy , who verbally acknowledged these results. Electronically Signed   By: Kristine Garbe M.D.   On: 05/15/2016 06:46   Result Date: 05/15/2016 CLINICAL DATA:  81 y/o F; history of cerebral vascular accident after carotid endarterectomy complicated by partial dissection last November presenting with transient right leg weakness. EXAM: MRI HEAD WITHOUT CONTRAST TECHNIQUE: Multiplanar, multiecho pulse sequences of the brain and surrounding structures were obtained without intravenous contrast. COMPARISON:  CTA of the head dated 05/15/2016. MRI of the brain dated 08/12/2015. FINDINGS: Brain: Punctate focus of diffusion hyperintensity within the left precentral gyrus with probable low ADC is likely to represent an acute/early subacute infarct. Additional small foci of diffusion hyperintensity left posterior frontal centrum semiovale do not demonstrate definite low ADC and are probably T2 shine through. No abnormal susceptibility hypointensity to indicate intracranial hemorrhage. There is mild diffuse parenchymal volume loss and moderate chronic microvascular ischemic changes as wall is several small chronic cortical infarcts in the left posterior frontal and parietal lobes. Extra-axial space: Normal ventricular size. No midline shift. No effacement of basilar cisterns. No extra-axial collection is identified. Proximal intracranial flow voids are maintained. No abnormality of the cervical medullary junction. Cavum septum pellucidum. Other: Left maxillary sinus mucous retention cyst and mild mucosal thickening of the ethmoid air cells. No abnormal signal of the  mastoid air cells. Orbits are unremarkable. Stable left frontal bone osteoma, otherwise the calvarium is unremarkable. IMPRESSION: 1. Punctate acute/early subacute infarct within the left precentral gyrus. 2. Mild parenchymal volume loss, moderate chronic microvascular ischemic changes, and small chronic infarcts in the left posterior frontal/parietal lobes. 3. Mild paranasal sinus disease. Electronically Signed: By: Kristine Garbe M.D. On: 05/15/2016 06:17     CBC  Recent Labs Lab 05/14/16 2016  WBC 5.2  HGB 12.8  HCT 41.2  PLT 190  MCV 89.2  MCH 27.7  MCHC 31.1  RDW 15.2  LYMPHSABS 1.4  MONOABS 0.4  EOSABS 0.1  BASOSABS 0.0    Chemistries   Recent Labs Lab 05/14/16 2016  NA 139  K 3.3*  CL 107  CO2 26  GLUCOSE 79  BUN 11  CREATININE 0.95  CALCIUM 9.0  AST 15  ALT 12*  ALKPHOS 76  BILITOT 0.4   ------------------------------------------------------------------------------------------------------------------  Recent Labs  05/15/16 0600  CHOL 218*  HDL 56  LDLCALC 147*  TRIG 76  CHOLHDL 3.9    Lab Results  Component Value Date   HGBA1C 6.4 (H) 05/15/2016   ------------------------------------------------------------------------------------------------------------------  No results for input(s): TSH, T4TOTAL, T3FREE, THYROIDAB in the last 72 hours.  Invalid input(s): FREET3 ------------------------------------------------------------------------------------------------------------------ No results for input(s): VITAMINB12, FOLATE, FERRITIN, TIBC, IRON, RETICCTPCT in the last 72 hours.  Coagulation profile No results for input(s): INR, PROTIME in the last 168 hours.  No results for input(s): DDIMER in the last 72 hours.  Cardiac Enzymes No results for input(s): CKMB, TROPONINI, MYOGLOBIN in the last 168 hours.  Invalid input(s):  CK ------------------------------------------------------------------------------------------------------------------ No results found for: BNP   Nickholas Goldston M.D on 05/17/2016 at 4:59 PM  Between 7am to 7pm - Pager - [phone]  After 7pm go to [email or web address] - password [email or web address]  [facility] -  Office  [phone]  Dragon dictation system was used to create this note, attempts have been made to correct errors, however presence of uncorrected errors is not a reflection quality of care provided

## 2016-05-17 NOTE — Progress Notes (Signed)
Pt's husband stated to MD he could no longer take care of his wife at home and was seeking SNF placement.  Spoke with Lorriane Shire, SW and explained that the husband called and stated he had found a bed at [facility].

## 2016-05-17 NOTE — Progress Notes (Addendum)
Physical Therapy Treatment Patient Details Name: Jasmine Arias MRN: [id number] DOB: [birth year] Today's Date: 05/17/2016    History of Present Illness Jasmine Arias is a 81 y.o. 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    PT Comments    Patient progressing with balance, but still unsafe without close S to min A.  Will need STSNF as spouse now reporting unable to provide care for pt at home.  Will continue skilled PT in acute until d/c.   Follow Up Recommendations  SNF     Equipment Recommendations  Other (comment) (TBA)    Recommendations for Other Services       Precautions / Restrictions Precautions Precautions: Fall    Mobility  Bed Mobility Overal bed mobility: Modified Independent                Transfers Overall transfer level: Needs assistance   Transfers: Sit to/from Stand           General transfer comment: steadying assist  Ambulation/Gait Ambulation/Gait assistance: Min guard;Supervision Ambulation Distance (Feet): 300 Feet Assistive device: None Gait Pattern/deviations: Step-through pattern;Shuffle;Decreased stride length;Drifts right/left;Narrow base of support     General Gait Details: initially minguard then close S occasional assist for straight path; initially c/o dizziness and provided cues for compensation with eyes on fixed target, but after walking denied further dizziness.   Stairs Stairs: Yes Stairs assistance: Min guard Stair Management: Forwards;Two rails;Alternating pattern Number of Stairs: 4 General stair comments: assist for safety, cues for rail use  Wheelchair Mobility    Modified Rankin (Stroke Patients Only) Modified Rankin (Stroke Patients Only) Pre-Morbid Rankin Score: Moderate disability Modified Rankin: Moderately severe disability     Balance Overall  balance assessment: Needs assistance           Standing balance-Leahy Scale: Fair                      Cognition Arousal/Alertness: Awake/alert Behavior During Therapy: WFL for tasks assessed/performed Overall Cognitive Status: History of cognitive impairments - at baseline                      Exercises General Exercises - Lower Extremity Hip ABduction/ADduction: Strengthening;10 reps;Standing;Both Hip Flexion/Marching: Strengthening;10 reps;Standing;Both Heel Raises: Strengthening;Standing;Both Mini-Sqauts: 5 reps;Strengthening;Standing;Seated (sit to stand no UE support)    General Comments General comments (skin integrity, edema, etc.): RN states spouse now feels unable to care for patient at home.  Requesting SNF referral.      Pertinent Vitals/Pain Pain Assessment: No/denies pain    Home Living                      Prior Function            PT Goals (current goals can now be found in the care plan section) Progress towards PT goals: Progressing toward goals    Frequency  Min 3X/week    PT Plan Discharge plan needs to be updated;Equipment recommendations need to be updated    Co-evaluation             End of Session Equipment Utilized During Treatment: Gait belt Activity Tolerance: Patient tolerated treatment well Patient left: in bed;with call bell/phone within reach;with bed alarm set     Time: 1100-1123 PT Time Calculation (min) (ACUTE ONLY): 23 min  Charges:  $Gait Training: 8-22 mins $Therapeutic Exercise: 8-22 mins                    G Codes:      Reginia Naas 2016-05-29, 12:54 PM  Magda Kiel, [place] May 29, 2016

## 2016-05-17 NOTE — Progress Notes (Signed)
Echocardiogram 2D Echocardiogram has been performed.  Aggie Cosier 05/17/2016, 9:01 AM

## 2016-05-18 LAB — GLUCOSE, CAPILLARY
GLUCOSE-CAPILLARY: 120 mg/dL — AB (ref 65–99)
Glucose-Capillary: 102 mg/dL — ABNORMAL HIGH (ref 65–99)
Glucose-Capillary: 88 mg/dL (ref 65–99)
Glucose-Capillary: 174 mg/dL — ABNORMAL HIGH (ref 65–99)

## 2016-05-18 NOTE — Progress Notes (Signed)
Patient Demographics:    Jasmine Arias, is a 81 y.o. female, DOB - 1935/02/22, [id number]  Admit date - 05/14/2016   Admitting Physician Edwin Dada, MD  Outpatient Primary MD for the patient is Park Liter, DO  LOS - 2   Chief Complaint  Patient presents with  . Weakness  . Stroke Symptoms        Subjective:    [name] today has no fevers, no emesis,  No chest pain,  With confusional episodes, mostly cooperative   Assessment  & Plan :    Principal Problem:   Right leg weakness Active Problems:   Malnutrition ([name])   Type 2 diabetes mellitus with background retinopathy ([name])   Essential hypertension   Compulsive tobacco user syndrome   Mixed Alzheimer's and vascular dementia   COPD (chronic obstructive pulmonary disease) (HCC)   History of stroke   Cerebrovascular accident (CVA) due to embolism of left anterior cerebral artery (HCC)   H/O endarterectomy   CVA (cerebral infarction)   1)Acute CVA Punctate CVA - Neuro deficits have mostly resolved,,  echocardiogram with preserved LV function, EF over 65 %, no intracardiac source of embolus, she does have grade 1 diastolic dysfunction, without regional wall motion abnormalities,  continue aspirin 325 mg daily, Discussed with Neurologist, patient will most likely need, repeat CTA head in 3 months,patient will also need aspirin and Plavix combo upon discharge for 3 months and then Plavix only after that. PT/ OT eval appreciated,   2)HTN- Allow some permissive Hypertension due to acute stroke, avoid very aggressive, rapid BP control at this time.   3)Social- patient wants to go home, however she is confused with cognitive deficits, patient's husband and family states that they unable to take care of patient at home, social worker trying to look into SNF   for subacute rehabilitation   Code Status : Full   Disposition  Plan  : SNF   Consults  :  Neurology   DVT Prophylaxis  :  Lovenox   Lab Results  Component Value Date   PLT 190 05/14/2016    Inpatient Medications  Scheduled Meds: . arformoterol  15 mcg Nebulization BID  . aspirin EC  325 mg Oral Daily  . atorvastatin  40 mg Oral q1800  . clopidogrel  75 mg Oral Q breakfast  . donepezil  10 mg Oral QHS  . enoxaparin (LOVENOX) injection  30 mg Subcutaneous Q24H  . feeding supplement (ENSURE ENLIVE)  237 mL Oral BID  . memantine  5 mg Oral BID  . metoprolol tartrate  12.5 mg Oral BID  . umeclidinium bromide  1 puff Inhalation Daily   Continuous Infusions:  PRN Meds:.acetaminophen **OR** acetaminophen, albuterol    Anti-infectives    None        Objective:   Vitals:   05/18/16 0928 05/18/16 0952 05/18/16 1300 05/18/16 1650  BP:  133/67 129/60 (!) 163/61  Pulse:  71 73 67  Resp:  20 16 20   Temp:  98.5 F (36.9 C) 98.6 F (37 C) 98.8 F (37.1 C)  TempSrc:  Oral Oral Oral  SpO2: 93% 95% 100% 100%  Weight:      Height:        Wt Readings  from Last 3 Encounters:  05/15/16 51.7 kg (114 lb)  04/05/16 48.3 kg (106 lb 6.4 oz)  02/27/16 46.7 kg (103 lb)     Intake/Output Summary (Last 24 hours) at 05/18/16 1939 Last data filed at 05/18/16 1240  Gross per 24 hour  Intake              480 ml  Output                0 ml  Net              480 ml     Physical Exam  Gen:- Awake Alert,  confused HEENT:- [place].AT, No sclera icterus Neck-Supple Neck,No JVD,.  Lungs-  CTAB  CV- S1, S2 normal Abd-  +ve B.Sounds, Abd Soft, No tenderness,    Extremity/Skin:- No  edema,    NeuroPsych-Patient remains confused and disoriented, no new significant neuro deficits at this time    Data Review:   Micro Results No results found for this or any previous visit (from the past 240 hour(s)).  Radiology Reports Ct Angio Head W Or Wo Contrast  Result Date: 05/15/2016 CLINICAL DATA:  81 y/o F; weakness and mental status changes. History  of prior stroke with right-sided weakness. History of Alzheimer's disease. History of left carotid endarterectomy. EXAM: CT ANGIOGRAPHY HEAD AND NECK TECHNIQUE: Multidetector CT imaging of the head and neck was performed using the standard protocol during bolus administration of intravenous contrast. Multiplanar CT image reconstructions and MIPs were obtained to evaluate the vascular anatomy. Carotid stenosis measurements (when applicable) are obtained utilizing NASCET criteria, using the distal internal carotid diameter as the denominator. CONTRAST:  50 cc Isovue 370. COMPARISON:  CT of head dated 05/14/2016. MRI of brain dated 08/12/2015. CT angiogram of the neck dated 08/11/2015. FINDINGS: CTA NECK Aortic arch: 3 vessel aortic arch with severe calcific atherosclerosis. Concentric calcification of the left subclavian artery origin with moderate approximately 50% stenosis. Right carotid system: New patent right common carotid artery with mild calcified plaque. Dense calcifications of the right carotid bifurcation with severe 70% stenosis of proximal ICA. The ICA is otherwise patent. Left carotid system: Small dissection flap just downstream to the origin of the left common carotid artery series 401, image 19. Patent stent within the mid left common carotid artery. No significant stenosis of the carotid bifurcation or internal carotid artery. Vertebral arteries:Right vertebral artery calcified plaque at the C6 level series 401, image 61 with probable moderate associated stenosis. No significant stenosis of the left vertebral artery. Skeleton: Moderate degenerative changes of the cervical spine without high-grade canal stenosis or foraminal narrowing. Other neck: Multiple thyroid nodules the largest in the left lobe measuring 6 mm. Stable enhancing mass within the superficial lobe of the left parotid gland with some extension into the deep lobecondyle measuring 17 x 25 x 47 mm (AP x ML x CC), series 401, image 7 the  8 and series 403, image 114. Deep to the enhancing mass is a 9 x 14 mm nodule in the expected location of the jugulodigastric lymph node IIa series 401, image 71 which may represent associated lymphadenopathy. Additional stable enhancing nodule within the left parotid tail measuring 5 mm. Upper chest: Mild centrilobular emphysema of the lung apices bilaterally. Left upper lobe a 6 mm ground-glass nodule series 401, image 12. CTA HEAD Anterior circulation: Moderate calcific atherosclerosis of cavernous internal carotid arteries without significant stenosis. Bilateral middle cerebral arteries, anterior cerebral arteries, and anterior communicating artery are patent.  No high-grade stenosis or large vessel occlusion is identified. 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 Posterior circulation: Small bilateral P1 segments with large posterior communicating arteries consistent with persistent fetal circulation. Bilateral V4 segments, the basilar artery, and posterior cerebral arteries are patent. No occlusion, aneurysm, dissection, or significant stenosis is identified. Venous sinuses: Patent. Anatomic variants: Bilateral fetal PCAs. Delayed phase: No abnormal enhancement of the brain parenchyma. Unchanged left frontal bone osteoma. Mild parenchymal volume loss and moderate chronic microvascular ischemic changes. No hydrocephalus. Chronic left frontal infarct. No intracranial hemorrhage. IMPRESSION: 1. No large vessel occlusion or high-grade stenosis of the circle of Willis. 2. Severe 70% stable stenosis of the right proximal internal carotid artery. 3. Patent left mid common carotid artery stent, small dissection flap proximal to the stent appears new from prior to stent placement, no interval study to determine chronicity. 4. 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 5.  Stable moderate approximately 50% stenosis of left subclavian artery origin. 6. 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 Possible enlarged left-sided level 2a lymph node. Stable 5 mm nodule in the left parotid tail. 7. Mild bilateral upper lobe emphysema. 8. **An incidental finding of potential clinical significance has been found. 6 mm left upper lobe ground-glass nodule.Initial follow-up with CT at 6-12 months is recommended to confirm persistence. If persistent, repeat CT is recommended every 2 years until 5 years of stability has been established. This recommendation follows the consensus statement: Guidelines for Management of Incidental Pulmonary Nodules Detected on CT Images:From the Fleischner Society 2017; published online before print (10.1148/radiol.IJ:2314499).** These results will be called to the ordering clinician or representative by the Radiologist Assistant, and communication documented in the PACS or zVision Dashboard. Electronically Signed   By: Kristine Garbe M.D.   On: 05/15/2016 05:57   Dg Chest 2 View  Result Date: 05/14/2016 CLINICAL DATA:  Two-day history of cough and right-sided weakness. EXAM: CHEST  2 VIEW COMPARISON:  09/08/2015 FINDINGS: Frontal film is markedly rotated. Lungs are hyperexpanded without focal airspace consolidation or overt pulmonary edema. No pleural effusion or pneumothorax evident. Cardiopericardial silhouette is at upper limits of normal for size. Vascular stent device noted in the region of the left thoracic inlet. The visualized bony structures of the thorax are intact. Telemetry leads overlie the chest. IMPRESSION: Emphysema without acute cardiopulmonary findings. Electronically Signed   By: Misty Stanley M.D.   On: 05/14/2016 21:39   Ct Head Wo Contrast  Result Date: 05/14/2016 CLINICAL DATA:  Right leg weakness. EXAM: CT  HEAD WITHOUT CONTRAST TECHNIQUE: Contiguous axial images were obtained from the base of the skull through the vertex without intravenous contrast. COMPARISON:  MRI 08/12/2015 FINDINGS: Brain: Moderate atrophy. Chronic ischemic changes in the white matter. Chronic left MCA infarct over the convexity. Negative for acute infarct. Negative for hemorrhage or mass. Vascular: Mild atherosclerotic calcification in the carotid and vertebral arteries. Skull: Benign-appearing bony exostosis left frontal bone. No acute skull abnormality Sinuses/Orbits: Negative Other: Negative IMPRESSION: Atrophy and chronic ischemic changes.  No acute abnormality. Electronically Signed   By: Franchot Gallo M.D.   On: 05/14/2016 21:34   Ct Angio Neck W/cm &/or Wo/cm  Result Date: 05/15/2016 CLINICAL DATA:  81 y/o F; weakness and mental status changes. History  of prior stroke with right-sided weakness. History of Alzheimer's disease. History of left carotid endarterectomy. EXAM: CT ANGIOGRAPHY HEAD AND NECK TECHNIQUE: Multidetector CT imaging of the head and neck was performed using the standard protocol during bolus administration of intravenous contrast. Multiplanar CT image reconstructions and MIPs were obtained to evaluate the vascular anatomy. Carotid stenosis measurements (when applicable) are obtained utilizing NASCET criteria, using the distal internal carotid diameter as the denominator. CONTRAST:  50 cc Isovue 370. COMPARISON:  CT of head dated 05/14/2016. MRI of brain dated 08/12/2015. CT angiogram of the neck dated 08/11/2015. FINDINGS: CTA NECK Aortic arch: 3 vessel aortic arch with severe calcific atherosclerosis. Concentric calcification of the left subclavian artery origin with moderate approximately 50% stenosis. Right carotid system: New patent right common carotid artery with mild calcified plaque. Dense calcifications of the right carotid bifurcation with severe 70% stenosis of proximal ICA. The ICA is otherwise patent.  Left carotid system: Small dissection flap just downstream to the origin of the left common carotid artery series 401, image 19. Patent stent within the mid left common carotid artery. No significant stenosis of the carotid bifurcation or internal carotid artery. Vertebral arteries:Right vertebral artery calcified plaque at the C6 level series 401, image 61 with probable moderate associated stenosis. No significant stenosis of the left vertebral artery. Skeleton: Moderate degenerative changes of the cervical spine without high-grade canal stenosis or foraminal narrowing. Other neck: Multiple thyroid nodules the largest in the left lobe measuring 6 mm. Stable enhancing mass within the superficial lobe of the left parotid gland with some extension into the deep lobecondyle measuring 17 x 25 x 47 mm (AP x ML x CC), series 401, image 7 the 8 and series 403, image 114. Deep to the enhancing mass is a 9 x 14 mm nodule in the expected location of the jugulodigastric lymph node IIa series 401, image 71 which may represent associated lymphadenopathy. Additional stable enhancing nodule within the left parotid tail measuring 5 mm. Upper chest: Mild centrilobular emphysema of the lung apices bilaterally. Left upper lobe a 6 mm ground-glass nodule series 401, image 12. CTA HEAD Anterior circulation: Moderate calcific atherosclerosis of cavernous internal carotid arteries without significant stenosis. Bilateral middle cerebral arteries, anterior cerebral arteries, and anterior communicating artery are patent. No high-grade stenosis or large vessel occlusion is identified. 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 Posterior circulation: Small bilateral P1 segments with large posterior communicating arteries consistent with persistent fetal circulation. Bilateral V4 segments, the basilar artery, and posterior cerebral arteries are patent. No  occlusion, aneurysm, dissection, or significant stenosis is identified. Venous sinuses: Patent. Anatomic variants: Bilateral fetal PCAs. Delayed phase: No abnormal enhancement of the brain parenchyma. Unchanged left frontal bone osteoma. Mild parenchymal volume loss and moderate chronic microvascular ischemic changes. No hydrocephalus. Chronic left frontal infarct. No intracranial hemorrhage. IMPRESSION: 1. No large vessel occlusion or high-grade stenosis of the circle of Willis. 2. Severe 70% stable stenosis of the right proximal internal carotid artery. 3. Patent left mid common carotid artery stent, small dissection flap proximal to the stent appears new from prior to stent placement, no interval study to determine chronicity. 4. 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 5. Stable moderate approximately 50% stenosis of left subclavian artery origin. 6. 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 Possible enlarged left-sided level 2a lymph node. Stable 5 mm nodule in the left parotid tail. 7. Mild bilateral upper lobe emphysema. 8. **An incidental finding of potential clinical significance has been found. 6 mm left upper lobe ground-glass nodule.Initial follow-up with CT at 6-12 months is recommended to confirm persistence. If persistent, repeat CT is recommended every 2 years until 5 years of stability has been established. This recommendation follows the consensus statement: Guidelines for Management of Incidental Pulmonary Nodules Detected on CT Images:From the Fleischner Society 2017; published online before print (10.1148/radiol.SG:5268862).** These results will be called to the ordering clinician or representative by the Radiologist Assistant, and communication documented in the PACS or zVision Dashboard. Electronically Signed   By:  Kristine Garbe M.D.   On: 05/15/2016 05:57   Mr Brain Wo Contrast  Addendum Date: 05/15/2016   ADDENDUM REPORT: 05/15/2016 06:46 ADDENDUM: These results were called by telephone at the time of interpretation on 05/15/2016 at 6:46 am to Dr. Myrene Buddy , who verbally acknowledged these results. Electronically Signed   By: Kristine Garbe M.D.   On: 05/15/2016 06:46   Result Date: 05/15/2016 CLINICAL DATA:  81 y/o F; history of cerebral vascular accident after carotid endarterectomy complicated by partial dissection last November presenting with transient right leg weakness. EXAM: MRI HEAD WITHOUT CONTRAST TECHNIQUE: Multiplanar, multiecho pulse sequences of the brain and surrounding structures were obtained without intravenous contrast. COMPARISON:  CTA of the head dated 05/15/2016. MRI of the brain dated 08/12/2015. FINDINGS: Brain: Punctate focus of diffusion hyperintensity within the left precentral gyrus with probable low ADC is likely to represent an acute/early subacute infarct. Additional small foci of diffusion hyperintensity left posterior frontal centrum semiovale do not demonstrate definite low ADC and are probably T2 shine through. No abnormal susceptibility hypointensity to indicate intracranial hemorrhage. There is mild diffuse parenchymal volume loss and moderate chronic microvascular ischemic changes as wall is several small chronic cortical infarcts in the left posterior frontal and parietal lobes. Extra-axial space: Normal ventricular size. No midline shift. No effacement of basilar cisterns. No extra-axial collection is identified. Proximal intracranial flow voids are maintained. No abnormality of the cervical medullary junction. Cavum septum pellucidum. Other: Left maxillary sinus mucous retention cyst and mild mucosal thickening of the ethmoid air cells. No abnormal signal of the mastoid air cells. Orbits are unremarkable. Stable left frontal bone osteoma,  otherwise the calvarium is unremarkable. IMPRESSION: 1. Punctate acute/early subacute infarct within the left precentral gyrus. 2. Mild parenchymal volume loss, moderate chronic microvascular ischemic changes, and small chronic infarcts in the left posterior frontal/parietal lobes. 3. Mild paranasal sinus disease. Electronically Signed: By: Kristine Garbe M.D. On: 05/15/2016 06:17     CBC  Recent Labs Lab 05/14/16 2016  WBC 5.2  HGB 12.8  HCT 41.2  PLT 190  MCV 89.2  MCH 27.7  MCHC 31.1  RDW 15.2  LYMPHSABS 1.4  MONOABS 0.4  EOSABS 0.1  BASOSABS 0.0    Chemistries   Recent Labs Lab 05/14/16 2016  NA 139  K 3.3*  CL 107  CO2 26  GLUCOSE 79  BUN 11  CREATININE 0.95  CALCIUM 9.0  AST 15  ALT 12*  ALKPHOS 76  BILITOT 0.4   ------------------------------------------------------------------------------------------------------------------ No results for input(s): CHOL, HDL, LDLCALC, TRIG, CHOLHDL, LDLDIRECT in the last 72 hours.  Lab Results  Component Value Date   HGBA1C 6.4 (H) 05/15/2016   ------------------------------------------------------------------------------------------------------------------ No results for input(s): TSH, T4TOTAL, T3FREE,  THYROIDAB in the last 72 hours.  Invalid input(s): FREET3 ------------------------------------------------------------------------------------------------------------------ No results for input(s): VITAMINB12, FOLATE, FERRITIN, TIBC, IRON, RETICCTPCT in the last 72 hours.  Coagulation profile No results for input(s): INR, PROTIME in the last 168 hours.  No results for input(s): DDIMER in the last 72 hours.  Cardiac Enzymes No results for input(s): CKMB, TROPONINI, MYOGLOBIN in the last 168 hours.  Invalid input(s): CK ------------------------------------------------------------------------------------------------------------------ No results found for: BNP   Dquan Cortopassi M.D on 05/18/2016 at 7:39  PM  Between 7am to 7pm - Pager - [phone]  After 7pm go to [email or web address] - password [email or web address]  [facility] -  Office  [phone]  Dragon dictation system was used to create this note, attempts have been made to correct errors, however presence of uncorrected errors is not a reflection quality of care provided

## 2016-05-18 NOTE — [place] FL2 (Signed)
[place] MEDICAID FL2 LEVEL OF CARE SCREENING TOOL     IDENTIFICATION  Patient Name: Jasmine Arias Birthdate: 07-06-1935 Sex: female Admission Date (Current Location): 05/14/2016  [facility] and [place] Number:  Herbalist and Address:  [facility] [place] [address], [place], [place] [address]      Provider Number: [id number]  Attending Physician Name and Address:  Roxan Hockey, MD  Relative Name and Phone Number:       Current Level of Care: Hospital Recommended Level of Care: [place] Prior Approval Number:    Date Approved/Denied:   PASRR Number:  [id number]   Discharge Plan: SNF    Current Diagnoses: Patient Active Problem List   Diagnosis Date Noted  . CVA (cerebral infarction) 05/16/2016  . Right leg weakness 05/15/2016  . History of stroke   . Cerebrovascular accident (CVA) due to embolism of left anterior cerebral artery ([place])   . H/O endarterectomy   . COPD (chronic obstructive pulmonary disease) ([place]) 02/27/2016  . Orthostatic hypotension 09/14/2015  . Iron deficiency anemia 09/14/2015  . CVA, old, monoplegia upper limb ([place]) 09/08/2015  . Trigeminy 09/08/2015  . Syncope 09/08/2015  . Carotid stenosis, symptomatic, with infarction ([place]) 08/11/2015  . Mixed Alzheimer's and vascular dementia 07/10/2015  . B12 deficiency 07/10/2015  . Malnutrition ([place]) 05/11/2015  . Dementia 05/11/2015  . Type 2 diabetes mellitus with background retinopathy ([place]) 05/11/2015  . Vertigo 05/11/2015  . HLD (hyperlipidemia) 05/11/2015  . Essential hypertension 05/11/2015  . Multiple thyroid nodules 05/11/2015  . Carotid artery stenosis 05/11/2015  . Aortic atherosclerosis ([place]) 05/11/2015  . Polycythemia 02/12/2014  . Hyperthyroidism, subclinical 02/12/2014  . Cardiac murmur 02/11/2014  . Carotid artery bruit 02/11/2014  . Fast heart beat 02/11/2014  . Lung mass 06/10/2012  . Compulsive tobacco user syndrome  06/09/2012    Orientation RESPIRATION BLADDER Height & Weight     Self, Time, Situation, Place  Normal Continent Weight: 114 lb (51.7 kg) Height:  5\' 1"  (154.9 cm)  BEHAVIORAL SYMPTOMS/MOOD NEUROLOGICAL BOWEL NUTRITION STATUS   (none )  (none ) Continent Diet (heart healthy/carb modified )  AMBULATORY STATUS COMMUNICATION OF NEEDS Skin   Limited Assist Verbally Normal                       Personal Care Assistance Level of Assistance  Bathing, Dressing, Feeding Bathing Assistance: Limited assistance Feeding assistance: Independent Dressing Assistance: Limited assistance     Functional Limitations Info  Speech, Hearing, Sight Sight Info: Adequate Hearing Info: Adequate Speech Info: Adequate    SPECIAL CARE FACTORS FREQUENCY  PT (By licensed PT), OT (By licensed OT)     PT Frequency: 3              Contractures      Additional Factors Info  Code Status, Allergies Code Status Info: FULL CODE  Allergies Info: N/A            Current Medications (05/18/2016):  This is the current hospital active medication list Current Facility-Administered Medications  Medication Dose Route Frequency Provider Last Rate Last Dose  . acetaminophen (TYLENOL) tablet 650 mg  650 mg Oral Q4H PRN Edwin Dada, MD       Or  . acetaminophen (TYLENOL) suppository 650 mg  650 mg Rectal Q4H PRN Edwin Dada, MD      . albuterol (PROVENTIL) (2.5 MG/3ML) 0.083% nebulizer solution 2.5 mg  2.5 mg Nebulization Q2H  PRN Edwin Dada, MD      . arformoterol [facility]) nebulizer solution 15 mcg  15 mcg Nebulization BID Edwin Dada, MD   15 mcg at 05/18/16 0924  . aspirin EC tablet 325 mg  325 mg Oral Daily Donzetta Starch, NP   325 mg at 05/18/16 1016  . atorvastatin (LIPITOR) tablet 40 mg  40 mg Oral q1800 Donzetta Starch, NP   40 mg at 05/17/16 1800  . clopidogrel (PLAVIX) tablet 75 mg  75 mg Oral Q breakfast Edwin Dada, MD   75 mg at 05/18/16 1016  .  donepezil (ARICEPT) tablet 10 mg  10 mg Oral QHS Edwin Dada, MD   10 mg at 05/17/16 2246  . enoxaparin (LOVENOX) injection 30 mg  30 mg Subcutaneous Q24H Edwin Dada, MD   30 mg at 05/18/16 1016  . feeding supplement (ENSURE ENLIVE) (ENSURE ENLIVE) liquid 237 mL  237 mL Oral BID Edwin Dada, MD   237 mL at 05/18/16 1000  . memantine (NAMENDA) tablet 5 mg  5 mg Oral BID Edwin Dada, MD   5 mg at 05/18/16 1016  . metoprolol tartrate (LOPRESSOR) tablet 12.5 mg  12.5 mg Oral BID Edwin Dada, MD   12.5 mg at 05/18/16 1016  . umeclidinium bromide (INCRUSE ELLIPTA) 62.5 MCG/INH 1 puff  1 puff Inhalation Daily Edwin Dada, MD   1 puff at 05/18/16 [id number]     Discharge Medications: Please see discharge summary for a list of discharge medications.  Relevant Imaging Results:  Relevant Lab Results:   Additional Information SSN [id number]  Glendon Axe A

## 2016-05-18 NOTE — Clinical Social Work Placement (Signed)
   CLINICAL SOCIAL WORK PLACEMENT  NOTE  Date:  05/18/2016  Patient Details  Name: Jasmine Arias MRN: [id number] Date of Birth: 1935-04-22  Clinical Social Work is seeking post-discharge placement for this patient at the [place] level of care (*CSW will initial, date and re-position this form in  chart as items are completed):  Yes   Patient/family provided with [facility] Work Department's list of facilities offering this level of care within the geographic area requested by the patient (or if unable, by the patient's family).  Yes   Patient/family informed of their freedom to choose among providers that offer the needed level of care, that participate in Medicare, Medicaid or managed care program needed by the patient, have an available bed and are willing to accept the patient.  Yes   Patient/family informed of [facility]'s ownership interest in [facility] and [facility], as well as of the fact that they are under no obligation to receive care at these facilities.  PASRR submitted to EDS on 05/18/16     PASRR number received on 05/18/16     Existing PASRR number confirmed on       FL2 transmitted to all facilities in geographic area requested by pt/family on 05/18/16     FL2 transmitted to all facilities within larger geographic area on       Patient informed that his/her managed care company has contracts with or will negotiate with certain facilities, including the following:            Patient/family informed of bed offers received.  Patient chooses bed at       Physician recommends and patient chooses bed at      Patient to be transferred to   on  .  Patient to be transferred to facility by       Patient family notified on   of transfer.  Name of family member notified:        PHYSICIAN Please sign FL2     Additional Comment:    _______________________________________________ Glendon Axe A 05/18/2016, 1:41  PM

## 2016-05-18 NOTE — Clinical Social Work Note (Signed)
Clinical Social Work Assessment  Patient Details  Name: Jasmine Arias MRN: [id number] Date of Birth: November 22, 1934  Date of referral:  05/18/16               Reason for consult:  Facility Placement, Discharge Planning                Permission sought to share information with:  Family Supports, Case Freight forwarder, Chartered certified accountant granted to share information::  Yes, Verbal Permission Granted  Name::      Leigh [place] )  Agency::   (SNF's )  Relationship::   (Spouse )  Contact Information:   [phone])  Housing/Transportation Living arrangements for the past 2 months:  Single Family Home Source of Information:  Spouse Patient Interpreter Needed:  None Criminal Activity/Legal Involvement Pertinent to Current Situation/Hospitalization:  No - Comment as needed Significant Relationships:  Spouse, Siblings Lives with:  Spouse Do you feel safe going back to the place where you live?  No Need for family participation in patient care:  No (Coment)  Care giving concerns:  Patient requiring ST rehab.    Social Worker assessment / plan:  Patient and husband agreeable to SNF placement and prefers [facility]. Husband has contacted facility to make arrangements. MSW explained SNF process. Husband expressed understanding a plans to await bed offers. No further concerns reported at this time. MSW to complete FL2 and fax via [facility]. MSW remains available as needed.   Employment status:  Retired Nurse, adult PT Recommendations:  [place] / Referral to community resources:  [place]  Patient/Family's Response to care:  Pt a/o x4. Pt and family agreeable to SNF placement and prefers [facility]. Pt's husband supportive and involved in care.   Patient/Family's Understanding of and Emotional Response to Diagnosis, Current Treatment, and Prognosis:  Unsure of husband's understanding of medical  interventions.   Emotional Assessment Appearance:  Appears stated age Attitude/Demeanor/Rapport:   (Pleasant) Affect (typically observed):  Accepting, Pleasant Orientation:  Oriented to Self, Oriented to Place, Oriented to  Time, Oriented to Situation Alcohol / Substance use:  Not Applicable Psych involvement (Current and /or in the community):  No (Comment)  Discharge Needs  Concerns to be addressed:  Care Coordination Readmission within the last 30 days:  No Current discharge risk:  Dependent with Mobility Barriers to Discharge:  Continued Medical Work up   Glendon Axe A 05/18/2016, 1:36 PM

## 2016-05-19 DIAGNOSIS — J45901 Unspecified asthma with (acute) exacerbation: Secondary | ICD-10-CM | POA: Diagnosis not present

## 2016-05-19 DIAGNOSIS — G459 Transient cerebral ischemic attack, unspecified: Secondary | ICD-10-CM | POA: Diagnosis not present

## 2016-05-19 DIAGNOSIS — Z720 Tobacco use: Secondary | ICD-10-CM | POA: Diagnosis not present

## 2016-05-19 DIAGNOSIS — I639 Cerebral infarction, unspecified: Secondary | ICD-10-CM | POA: Diagnosis not present

## 2016-05-19 DIAGNOSIS — E119 Type 2 diabetes mellitus without complications: Secondary | ICD-10-CM | POA: Diagnosis not present

## 2016-05-19 DIAGNOSIS — I63422 Cerebral infarction due to embolism of left anterior cerebral artery: Secondary | ICD-10-CM | POA: Diagnosis not present

## 2016-05-19 DIAGNOSIS — J449 Chronic obstructive pulmonary disease, unspecified: Secondary | ICD-10-CM | POA: Diagnosis not present

## 2016-05-19 DIAGNOSIS — Z8673 Personal history of transient ischemic attack (TIA), and cerebral infarction without residual deficits: Secondary | ICD-10-CM | POA: Diagnosis not present

## 2016-05-19 DIAGNOSIS — I10 Essential (primary) hypertension: Secondary | ICD-10-CM | POA: Diagnosis not present

## 2016-05-19 DIAGNOSIS — I6522 Occlusion and stenosis of left carotid artery: Secondary | ICD-10-CM | POA: Diagnosis not present

## 2016-05-19 DIAGNOSIS — E785 Hyperlipidemia, unspecified: Secondary | ICD-10-CM | POA: Diagnosis not present

## 2016-05-19 DIAGNOSIS — G309 Alzheimer's disease, unspecified: Secondary | ICD-10-CM | POA: Diagnosis not present

## 2016-05-19 DIAGNOSIS — G308 Other Alzheimer's disease: Secondary | ICD-10-CM | POA: Diagnosis not present

## 2016-05-19 DIAGNOSIS — R278 Other lack of coordination: Secondary | ICD-10-CM | POA: Diagnosis not present

## 2016-05-19 DIAGNOSIS — I69351 Hemiplegia and hemiparesis following cerebral infarction affecting right dominant side: Secondary | ICD-10-CM | POA: Diagnosis not present

## 2016-05-19 DIAGNOSIS — R601 Generalized edema: Secondary | ICD-10-CM | POA: Diagnosis not present

## 2016-05-19 DIAGNOSIS — R531 Weakness: Secondary | ICD-10-CM | POA: Diagnosis not present

## 2016-05-19 DIAGNOSIS — E46 Unspecified protein-calorie malnutrition: Secondary | ICD-10-CM | POA: Diagnosis not present

## 2016-05-19 DIAGNOSIS — Z48812 Encounter for surgical aftercare following surgery on the circulatory system: Secondary | ICD-10-CM | POA: Diagnosis not present

## 2016-05-19 DIAGNOSIS — R269 Unspecified abnormalities of gait and mobility: Secondary | ICD-10-CM | POA: Diagnosis not present

## 2016-05-19 DIAGNOSIS — Z008 Encounter for other general examination: Secondary | ICD-10-CM | POA: Diagnosis not present

## 2016-05-19 DIAGNOSIS — R05 Cough: Secondary | ICD-10-CM | POA: Diagnosis not present

## 2016-05-19 DIAGNOSIS — R29898 Other symptoms and signs involving the musculoskeletal system: Secondary | ICD-10-CM | POA: Diagnosis not present

## 2016-05-19 DIAGNOSIS — K5900 Constipation, unspecified: Secondary | ICD-10-CM | POA: Diagnosis not present

## 2016-05-19 DIAGNOSIS — I6990 Unspecified sequelae of unspecified cerebrovascular disease: Secondary | ICD-10-CM | POA: Diagnosis not present

## 2016-05-19 DIAGNOSIS — I6789 Other cerebrovascular disease: Secondary | ICD-10-CM | POA: Diagnosis not present

## 2016-05-19 LAB — BASIC METABOLIC PANEL
ANION GAP: 7 (ref 5–15)
BUN: 24 mg/dL — ABNORMAL HIGH (ref 6–20)
CALCIUM: 9.1 mg/dL (ref 8.9–10.3)
CO2: 27 mmol/L (ref 22–32)
Chloride: 105 mmol/L (ref 101–111)
Creatinine, Ser: 1.1 mg/dL — ABNORMAL HIGH (ref 0.44–1.00)
GFR, EST AFRICAN AMERICAN: 54 mL/min — AB (ref >60)
GFR, EST NON AFRICAN AMERICAN: 47 mL/min — AB (ref >60)
GLUCOSE: 118 mg/dL — AB (ref 65–99)
POTASSIUM: 4.1 mmol/L (ref 3.5–5.1)
SODIUM: 139 mmol/L (ref 135–145)

## 2016-05-19 LAB — CBC
HCT: 39.4 % (ref 36.0–46.0)
Hemoglobin: 11.9 g/dL — ABNORMAL LOW (ref 12.0–15.0)
MCH: 27.2 pg (ref 26.0–34.0)
MCHC: 30.2 g/dL (ref 30.0–36.0)
MCV: 90 fL (ref 78.0–100.0)
PLATELETS: 180 10*3/uL (ref 150–400)
RBC: 4.38 MIL/uL (ref 3.87–5.11)
RDW: 15.1 % (ref 11.5–15.5)
WBC: 5.3 10*3/uL (ref 4.0–10.5)

## 2016-05-19 LAB — GLUCOSE, CAPILLARY
GLUCOSE-CAPILLARY: 119 mg/dL — AB (ref 65–99)
GLUCOSE-CAPILLARY: 125 mg/dL — AB (ref 65–99)

## 2016-05-19 MED ORDER — ATORVASTATIN CALCIUM 40 MG PO TABS: 40.0000 mg | ORAL_TABLET | Freq: Every day | ORAL | 0 refills | Status: DC

## 2016-05-19 MED ORDER — ALBUTEROL SULFATE (2.5 MG/3ML) 0.083% IN NEBU: 2.5000 mg | INHALATION_SOLUTION | RESPIRATORY_TRACT | 12 refills | Status: DC | PRN

## 2016-05-19 MED ORDER — ASPIRIN 325 MG PO TBEC: 325.0000 mg | DELAYED_RELEASE_TABLET | Freq: Every day | ORAL | 0 refills | Status: DC

## 2016-05-19 NOTE — Discharge Summary (Signed)
Jasmine Arias, is a 81 y.o. female  DOB 23-May-1935  MRN [id number].  Admission date:  05/14/2016  Admitting Physician  Edwin Dada, MD  Discharge Date:  05/19/2016   Primary MD  Park Liter, DO  Recommendations for primary care physician for things to follow:   1)Acute CVA Punctate CVA/ left mid common carotid artery stent , small dissection flap proximal to the stent apears new from prior stent placement.   - Neuro deficits have mostly resolved,,  echocardiogram with preserved LV function, EF over 65 %, no intracardiac source of embolus, she does have grade 1 diastolic dysfunction, without regional wall motion abnormalities,  continue aspirin 325 mg daily, Discussed with Neurologist, patient will most likely need, repeat CTA head in 3 months,patient will also need aspirin and Plavix combo upon discharge for 3 months and then Plavix only after that. PT/ OT eval appreciated,  Neurology f/u needed in 2 month Dr. Erlinda Hong  2)HTN- Allow some permissive Hypertension due to acute stroke, avoid very aggressive, rapid BP control at this time.  3)  Stable enhancing mass within the superficial lobe left parotid gland 17 x25 x 83mm  Please arrange ENT referal to evaluate   4) Left upper lung nodule 57mm, repeat CT scan in 6-12 Months needed  5) R carotid stenosis (70%)  6) Dementia Cont aricept, cont namenda  7) Copd  Cont anoro Cont albuterol neb q2h prn  8) Protein calorie malnutrition Cont supplement  I spoke with Mr. Arvella Fronda today and made him aware of the left parotid gland mass as well as the left upper lung nodule.    Admission Diagnosis  TIA (transient ischemic attack) [G45.9] Weakness [R53.1] History of stroke [Z86.73]   Discharge Diagnosis  TIA (transient ischemic attack) [G45.9] Weakness [R53.1] History of stroke [Z86.73]    Principal Problem:   Right leg  weakness Active Problems:   Malnutrition ([place])   Type 2 diabetes mellitus with background retinopathy ([place])   Essential hypertension   Compulsive tobacco user syndrome   Mixed Alzheimer's and vascular dementia   COPD (chronic obstructive pulmonary disease) (H[facility])   History of stroke   Cerebrovascular accident (CVA) due to embolism of left anterior cerebral artery ([facility])   H/O endarterectomy   CVA (cerebral infarction)      Past Medical History:  Diagnosis Date  . Anemia   . Anxiety   . COPD (chronic obstructive pulmonary disease) ([place])   . Dementia   . Diabetes mellitus without complication ([place])   . Hyperlipidemia   . Hypertension   . Indigestion   . Memory changes   . Stroke (cerebrum) [facility])     Past Surgical History:  Procedure Laterality Date  . ABDOMINAL HYSTERECTOMY     heavy bleeding  . CAROTID ARTERY - SUBCLAVIAN ARTERY BYPASS GRAFT    . ENDARTERECTOMY Left 08/11/2015   Procedure: ENDARTERECTOMY CAROTID;  Surgeon: Katha Cabal, MD;  Location: [facility] ORS;  Service: Vascular;  Laterality: Left;  . PERIPHERAL VASCULAR CATHETERIZATION  Left 08/11/2015   Procedure: Carotid Angiography;  Surgeon: Katha Cabal, MD;  Location: [facility];  Service: Cardiovascular;  Laterality: Left;       HPI  from the history and physical done on the day of admission:     81 y.o. 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  History is collected from the patient who has mild dementia and her grandson.  They report that this afternoon, her husband noticed she "couldn't walk" and so called EMS (the patient had no symptoms).  There was no associated change in her chronic right arm weakness, speech, facial asymmetry or confusion.  No LOC or weakness or seizure.  When EMS arrived, she appeared to be dragging her right leg while they  helped her to the ambulance.    ED course: -Afebrile, heart rate 70s, resiprations fast but SpO2 100% on room air, blood pressure moderately elevated -Na 139, K 3.9, Cr 0.9 (baseline 0.8), WBC 5.2K, Hgb 12 -CT head unremarkable for age -CXR and ECG consistent with chronic lung disease -Her right leg weakness had resolved, but lasted at least an hour -She was evaluated by Neurology, who recommended TIA evaluation to be expedited and [facility] were asked to admit  Evidently she had TIA symptoms last Spring, was evaluated and found to have 90% Right carotid stenosis and CEA was recommended.  She initially declined, but eventually chose to have surgery, but unfortunately, the post-op course was complicated by acute stroke from a right carotid partial dissection and thrombus causing emboli and RIGHT MCA territory infarcts, resulting in slurred speech and right arm and leg weakness.  Her right leg weakness and speech resolved, but her RIGHT arm weakness have been permanent.    Hospital Course:      MRI brain 05/15/2016=>  Punctate acute/early subacute infarct within the left precentral cyrus,  Mild parenchymal volume loss, moderate chronic microvascular ischemic changes, and small chronic infarcts in the left posterior frontal/parietal lobes. Mild paranasal sinus disease.  Neurology consulted.   Pt started on aspirin in addition to plavix for 91months due to ? Dissection (CTA neck 8/16), plavix only after that per neurology. Cardiac echo 8/18 => EF 65%, no thrombus or PFO.  Pt had LDL 147, and started on lipitor 40mg  po qhs.  Pt encouraged not to smoke.  Pt appears stable , her right sided weakness improved.  Pt will be discharge to rehab.     Follow UP  Follow-up Information    Xu,Jindong, MD Follow up in 2 month(s).   Specialty:  Neurology Why:  stroke clinic. office will call you with appt date and time Contact information: [address] [place]  [address] [place], [place] Follow up in 1 week(s).   Specialty:  Family Medicine Contact information: [place] [address] [phone]           Or with SNF physician in 3 days  Consults obtained - neurology  Discharge Condition: stable, [facility].   Diet and Activity recommendation: See Discharge Instructions below  Discharge Instructions     Discharge Instructions    Ambulatory referral to Neurology    Complete by:  As directed   Dr. Erlinda Hong requests followup in 2 months        Discharge Medications  Medication List    TAKE these medications   acetaminophen 325 MG tablet Commonly known as:  TYLENOL Take 1-2 tablets (325-650 mg total) by mouth every 4 (four) hours as needed for mild pain (or temp >/= 101 F).   albuterol (2.5 MG/3ML) 0.083% nebulizer solution Commonly known as:  PROVENTIL Take 3 mLs (2.5 mg total) by nebulization every 2 (two) hours as needed for wheezing or shortness of breath.   aspirin 325 MG EC tablet Take 1 tablet (325 mg total) by mouth daily.   atorvastatin 40 MG tablet Commonly known as:  LIPITOR Take 1 tablet (40 mg total) by mouth daily at 6 PM.   clopidogrel 75 MG tablet Commonly known as:  PLAVIX Take 1 tablet (75 mg total) by mouth daily with breakfast.   donepezil 10 MG tablet Commonly known as:  ARICEPT Take 1 tablet (10 mg total) by mouth at bedtime.   ENSURE NUTRITION SHAKE Liqd Take 8 oz by mouth 2 (two) times daily.   memantine 5 MG tablet Commonly known as:  NAMENDA Take 1 tablet (5 mg total) by mouth 2 (two) times daily.   metoprolol tartrate 25 MG tablet Commonly known as:  LOPRESSOR Take 0.5 tablets (12.5 mg total) by mouth 2 (two) times daily.   umeclidinium-vilanterol 62.5-25 MCG/INH Aepb Commonly known as:  ANORO ELLIPTA Inhale 1 puff into the lungs daily.       Major procedures and Radiology Reports - PLEASE review detailed and final  reports for all details, in brief -      Ct Angio Head W Or Wo Contrast  Result Date: 05/15/2016 CLINICAL DATA:  81 y/o F; weakness and mental status changes. History of prior stroke with right-sided weakness. History of Alzheimer's disease. History of left carotid endarterectomy. EXAM: CT ANGIOGRAPHY HEAD AND NECK TECHNIQUE: Multidetector CT imaging of the head and neck was performed using the standard protocol during bolus administration of intravenous contrast. Multiplanar CT image reconstructions and MIPs were obtained to evaluate the vascular anatomy. Carotid stenosis measurements (when applicable) are obtained utilizing NASCET criteria, using the distal internal carotid diameter as the denominator. CONTRAST:  50 cc Isovue 370. COMPARISON:  CT of head dated 05/14/2016. MRI of brain dated 08/12/2015. CT angiogram of the neck dated 08/11/2015. FINDINGS: CTA NECK Aortic arch: 3 vessel aortic arch with severe calcific atherosclerosis. Concentric calcification of the left subclavian artery origin with moderate approximately 50% stenosis. Right carotid system: New patent right common carotid artery with mild calcified plaque. Dense calcifications of the right carotid bifurcation with severe 70% stenosis of proximal ICA. The ICA is otherwise patent. Left carotid system: Small dissection flap just downstream to the origin of the left common carotid artery series 401, image 19. Patent stent within the mid left common carotid artery. No significant stenosis of the carotid bifurcation or internal carotid artery. Vertebral arteries:Right vertebral artery calcified plaque at the C6 level series 401, image 61 with probable moderate associated stenosis. No significant stenosis of the left vertebral artery. Skeleton: Moderate degenerative changes of the cervical spine without high-grade canal stenosis or foraminal narrowing. Other neck: Multiple thyroid nodules the largest in the left lobe measuring 6 mm. Stable  enhancing mass within the superficial lobe of the left parotid gland with some extension into the deep lobecondyle measuring 17 x 25 x 47 mm (AP x ML x CC), series 401, image 7 the 8 and series 403, image 114. Deep to the enhancing mass is a 9 x 14 mm nodule in  the expected location of the jugulodigastric lymph node IIa series 401, image 71 which may represent associated lymphadenopathy. Additional stable enhancing nodule within the left parotid tail measuring 5 mm. Upper chest: Mild centrilobular emphysema of the lung apices bilaterally. Left upper lobe a 6 mm ground-glass nodule series 401, image 12. CTA HEAD Anterior circulation: Moderate calcific atherosclerosis of cavernous internal carotid arteries without significant stenosis. Bilateral middle cerebral arteries, anterior cerebral arteries, and anterior communicating artery are patent. No high-grade stenosis or large vessel occlusion is identified. 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 Posterior circulation: Small bilateral P1 segments with large posterior communicating arteries consistent with persistent fetal circulation. Bilateral V4 segments, the basilar artery, and posterior cerebral arteries are patent. No occlusion, aneurysm, dissection, or significant stenosis is identified. Venous sinuses: Patent. Anatomic variants: Bilateral fetal PCAs. Delayed phase: No abnormal enhancement of the brain parenchyma. Unchanged left frontal bone osteoma. Mild parenchymal volume loss and moderate chronic microvascular ischemic changes. No hydrocephalus. Chronic left frontal infarct. No intracranial hemorrhage. IMPRESSION: 1. No large vessel occlusion or high-grade stenosis of the circle of Willis. 2. Severe 70% stable stenosis of the right proximal internal carotid artery. 3. Patent left mid common carotid artery stent, small dissection flap proximal to the stent appears new  from prior to stent placement, no interval study to determine chronicity. 4. 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 5. Stable moderate approximately 50% stenosis of left subclavian artery origin. 6. 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 Possible enlarged left-sided level 2a lymph node. Stable 5 mm nodule in the left parotid tail. 7. Mild bilateral upper lobe emphysema. 8. **An incidental finding of potential clinical significance has been found. 6 mm left upper lobe ground-glass nodule.Initial follow-up with CT at 6-12 months is recommended to confirm persistence. If persistent, repeat CT is recommended every 2 years until 5 years of stability has been established. This recommendation follows the consensus statement: Guidelines for Management of Incidental Pulmonary Nodules Detected on CT Images:From the Fleischner Society 2017; published online before print (10.1148/radiol.IJ:2314499).** These results will be called to the ordering clinician or representative by the Radiologist Assistant, and communication documented in the PACS or zVision Dashboard. Electronically Signed   By: Kristine Garbe M.D.   On: 05/15/2016 05:57   Dg Chest 2 View  Result Date: 05/14/2016 CLINICAL DATA:  Two-day history of cough and right-sided weakness. EXAM: CHEST  2 VIEW COMPARISON:  09/08/2015 FINDINGS: Frontal film is markedly rotated. Lungs are hyperexpanded without focal airspace consolidation or overt pulmonary edema. No pleural effusion or pneumothorax evident. Cardiopericardial silhouette is at upper limits of normal for size. Vascular stent device noted in the region of the left thoracic inlet. The visualized bony structures of the thorax are intact. Telemetry leads overlie the chest. IMPRESSION: Emphysema without acute cardiopulmonary  findings. Electronically Signed   By: Misty Stanley M.D.   On: 05/14/2016 21:39   Ct Head Wo Contrast  Result Date: 05/14/2016 CLINICAL DATA:  Right leg weakness. EXAM: CT HEAD WITHOUT CONTRAST TECHNIQUE: Contiguous axial images were obtained from the base of the skull through the vertex without intravenous contrast. COMPARISON:  MRI 08/12/2015 FINDINGS: Brain: Moderate atrophy. Chronic ischemic changes in the white matter. Chronic left MCA infarct over the convexity. Negative for  acute infarct. Negative for hemorrhage or mass. Vascular: Mild atherosclerotic calcification in the carotid and vertebral arteries. Skull: Benign-appearing bony exostosis left frontal bone. No acute skull abnormality Sinuses/Orbits: Negative Other: Negative IMPRESSION: Atrophy and chronic ischemic changes.  No acute abnormality. Electronically Signed   By: Franchot Gallo M.D.   On: 05/14/2016 21:34   Ct Angio Neck W/cm &/or Wo/cm  Result Date: 05/15/2016 CLINICAL DATA:  81 y/o F; weakness and mental status changes. History of prior stroke with right-sided weakness. History of Alzheimer's disease. History of left carotid endarterectomy. EXAM: CT ANGIOGRAPHY HEAD AND NECK TECHNIQUE: Multidetector CT imaging of the head and neck was performed using the standard protocol during bolus administration of intravenous contrast. Multiplanar CT image reconstructions and MIPs were obtained to evaluate the vascular anatomy. Carotid stenosis measurements (when applicable) are obtained utilizing NASCET criteria, using the distal internal carotid diameter as the denominator. CONTRAST:  50 cc Isovue 370. COMPARISON:  CT of head dated 05/14/2016. MRI of brain dated 08/12/2015. CT angiogram of the neck dated 08/11/2015. FINDINGS: CTA NECK Aortic arch: 3 vessel aortic arch with severe calcific atherosclerosis. Concentric calcification of the left subclavian artery origin with moderate approximately 50% stenosis. Right carotid system: New patent right  common carotid artery with mild calcified plaque. Dense calcifications of the right carotid bifurcation with severe 70% stenosis of proximal ICA. The ICA is otherwise patent. Left carotid system: Small dissection flap just downstream to the origin of the left common carotid artery series 401, image 19. Patent stent within the mid left common carotid artery. No significant stenosis of the carotid bifurcation or internal carotid artery. Vertebral arteries:Right vertebral artery calcified plaque at the C6 level series 401, image 61 with probable moderate associated stenosis. No significant stenosis of the left vertebral artery. Skeleton: Moderate degenerative changes of the cervical spine without high-grade canal stenosis or foraminal narrowing. Other neck: Multiple thyroid nodules the largest in the left lobe measuring 6 mm. Stable enhancing mass within the superficial lobe of the left parotid gland with some extension into the deep lobecondyle measuring 17 x 25 x 47 mm (AP x ML x CC), series 401, image 7 the 8 and series 403, image 114. Deep to the enhancing mass is a 9 x 14 mm nodule in the expected location of the jugulodigastric lymph node IIa series 401, image 71 which may represent associated lymphadenopathy. Additional stable enhancing nodule within the left parotid tail measuring 5 mm. Upper chest: Mild centrilobular emphysema of the lung apices bilaterally. Left upper lobe a 6 mm ground-glass nodule series 401, image 12. CTA HEAD Anterior circulation: Moderate calcific atherosclerosis of cavernous internal carotid arteries without significant stenosis. Bilateral middle cerebral arteries, anterior cerebral arteries, and anterior communicating artery are patent. No high-grade stenosis or large vessel occlusion is identified. 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 Posterior circulation: Small bilateral P1 segments with  large posterior communicating arteries consistent with persistent fetal circulation. Bilateral V4 segments, the basilar artery, and posterior cerebral arteries are patent. No occlusion, aneurysm, dissection, or significant stenosis is identified. Venous sinuses: Patent. Anatomic variants: Bilateral fetal PCAs. Delayed phase: No abnormal enhancement of the brain parenchyma. Unchanged left frontal bone osteoma. Mild parenchymal volume loss and moderate chronic microvascular ischemic changes. No hydrocephalus. Chronic left frontal infarct. No intracranial hemorrhage. IMPRESSION: 1. No large vessel occlusion or high-grade stenosis of the circle of Willis. 2. Severe 70% stable stenosis of the  right proximal internal carotid artery. 3. Patent left mid common carotid artery stent, small dissection flap proximal to the stent appears new from prior to stent placement, no interval study to determine chronicity. 4. 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 5. Stable moderate approximately 50% stenosis of left subclavian artery origin. 6. 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 Possible enlarged left-sided level 2a lymph node. Stable 5 mm nodule in the left parotid tail. 7. Mild bilateral upper lobe emphysema. 8. **An incidental finding of potential clinical significance has been found. 6 mm left upper lobe ground-glass nodule.Initial follow-up with CT at 6-12 months is recommended to confirm persistence. If persistent, repeat CT is recommended every 2 years until 5 years of stability has been established. This recommendation follows the consensus statement: Guidelines for Management of Incidental Pulmonary Nodules Detected on CT Images:From the Fleischner Society 2017; published online before print (10.1148/radiol.IJ:2314499).** These results will be  called to the ordering clinician or representative by the Radiologist Assistant, and communication documented in the PACS or zVision Dashboard. Electronically Signed   By: Kristine Garbe M.D.   On: 05/15/2016 05:57   Mr Brain Wo Contrast  Addendum Date: 05/15/2016   ADDENDUM REPORT: 05/15/2016 06:46 ADDENDUM: These results were called by telephone at the time of interpretation on 05/15/2016 at 6:46 am to Dr. Myrene Buddy , who verbally acknowledged these results. Electronically Signed   By: Kristine Garbe M.D.   On: 05/15/2016 06:46   Result Date: 05/15/2016 CLINICAL DATA:  81 y/o F; history of cerebral vascular accident after carotid endarterectomy complicated by partial dissection last November presenting with transient right leg weakness. EXAM: MRI HEAD WITHOUT CONTRAST TECHNIQUE: Multiplanar, multiecho pulse sequences of the brain and surrounding structures were obtained without intravenous contrast. COMPARISON:  CTA of the head dated 05/15/2016. MRI of the brain dated 08/12/2015. FINDINGS: Brain: Punctate focus of diffusion hyperintensity within the left precentral gyrus with probable low ADC is likely to represent an acute/early subacute infarct. Additional small foci of diffusion hyperintensity left posterior frontal centrum semiovale do not demonstrate definite low ADC and are probably T2 shine through. No abnormal susceptibility hypointensity to indicate intracranial hemorrhage. There is mild diffuse parenchymal volume loss and moderate chronic microvascular ischemic changes as wall is several small chronic cortical infarcts in the left posterior frontal and parietal lobes. Extra-axial space: Normal ventricular size. No midline shift. No effacement of basilar cisterns. No extra-axial collection is identified. Proximal intracranial flow voids are maintained. No abnormality of the cervical medullary junction. Cavum septum pellucidum. Other: Left maxillary sinus mucous retention  cyst and mild mucosal thickening of the ethmoid air cells. No abnormal signal of the mastoid air cells. Orbits are unremarkable. Stable left frontal bone osteoma, otherwise the calvarium is unremarkable. IMPRESSION: 1. Punctate acute/early subacute infarct within the left precentral gyrus. 2. Mild parenchymal volume loss, moderate chronic microvascular ischemic changes, and small chronic infarcts in the left posterior frontal/parietal lobes. 3. Mild paranasal sinus disease. Electronically Signed: By: Kristine Garbe M.D. On: 05/15/2016 06:17    Micro Results     No results found for this or any previous visit (from the past 240 hour(s)).     Today   Subjective    Jasmine Arias today has no headache,no chest abdominal pain,no new weakness tingling or numbness, feels much better wants to go SNF  today   Objective   Blood pressure 120/70, pulse 71, temperature 97.7 F (36.5 C), temperature source Oral, resp. rate 18, height 5\' 1"  (1.549 m), weight 51.7 kg (114 lb), SpO2 100 %.   Intake/Output Summary (Last 24 hours) at 05/19/16 0831 Last data filed at 05/18/16 1240  Gross per 24 hour  Intake              480 ml  Output                0 ml  Net              480 ml    Exam Awake Alert, Oriented x 2  No new F.N deficits, Normal affect .AT,PERRAL Supple Neck,No JVD, No cervical lymphadenopathy appriciated.  Symmetrical Chest wall movement, Good air movement bilaterally, CTAB RRR,No Gallops,Rubs or new Murmurs, No Parasternal Heave +ve B.Sounds, Abd Soft, Non tender, No organomegaly appriciated, No rebound -guarding or rigidity. No Cyanosis, Clubbing or edema, No new Rash or bruise Reflexes 2+ symmetic diffuse with downgoing toes bilaterally.  Motor 5-/5 in the right upper and lower ext, otherwise 5/5 strength.    Data Review   CBC w Diff: Lab Results  Component Value Date   WBC 5.3 05/19/2016   HGB 11.9 (L) 05/19/2016   HGB 13.7 10/02/2014   HCT 39.4 05/19/2016    HCT 39.2 02/27/2016   PLT 180 05/19/2016   PLT 267 02/27/2016   LYMPHOPCT 28 05/14/2016   LYMPHOPCT 24.5 10/02/2014   MONOPCT 7 05/14/2016   MONOPCT 5.7 10/02/2014   EOSPCT 2 05/14/2016   EOSPCT 1.1 10/02/2014   BASOPCT 0 05/14/2016   BASOPCT 1.1 10/02/2014    CMP: Lab Results  Component Value Date   NA 139 05/19/2016   NA 141 02/27/2016   NA 139 10/02/2014   K 4.1 05/19/2016   K 3.5 10/02/2014   CL 105 05/19/2016   CL 104 10/02/2014   CO2 27 05/19/2016   CO2 28 10/02/2014   BUN 24 (H) 05/19/2016   BUN 10 02/27/2016   BUN 16 10/02/2014   CREATININE 1.10 (H) 05/19/2016   CREATININE 1.09 10/02/2014   PROT 6.2 (L) 05/14/2016   PROT 6.8 02/27/2016   ALBUMIN 3.3 (L) 05/14/2016   ALBUMIN 3.9 02/27/2016   BILITOT 0.4 05/14/2016   BILITOT 0.2 02/27/2016   ALKPHOS 76 05/14/2016   AST 15 05/14/2016   ALT 12 (L) 05/14/2016  .   Total Time in preparing paper work, data evaluation and todays exam - 78 minutes  Jani Gravel M.D on 05/19/2016 at 8:31 AM  [facility]   Office  [phone]

## 2016-05-19 NOTE — Clinical Social Work Placement (Signed)
   CLINICAL SOCIAL WORK PLACEMENT  NOTE  Date:  05/19/2016  Patient Details  Name: Jasmine Arias MRN: [id number] Date of Birth: 1935-01-09  Clinical Social Work is seeking post-discharge placement for this patient at the [place] level of care (*CSW will initial, date and re-position this form in  chart as items are completed):  Yes   Patient/family provided with [facility] Work Department's list of facilities offering this level of care within the geographic area requested by the patient (or if unable, by the patient's family).  Yes   Patient/family informed of their freedom to choose among providers that offer the needed level of care, that participate in Medicare, Medicaid or managed care program needed by the patient, have an available bed and are willing to accept the patient.  Yes   Patient/family informed of [facility]'s ownership interest in [facility] and [facility], as well as of the fact that they are under no obligation to receive care at these facilities.  PASRR submitted to EDS on 05/18/16     PASRR number received on 05/18/16     Existing PASRR number confirmed on       FL2 transmitted to all facilities in geographic area requested by pt/family on 05/18/16     FL2 transmitted to all facilities within larger geographic area on       Patient informed that his/her managed care company has contracts with or will negotiate with certain facilities, including the following:        Yes   Patient/family informed of bed offers received.  Patient chooses bed at [facility]     Physician recommends and patient chooses bed at      Patient to be transferred to [facility] on 05/19/16.  Patient to be transferred to facility by Ambulance     Patient family notified on 05/19/16 of transfer.  Name of family member notified:  Fritz Pickerel     PHYSICIAN Please sign FL2, Please prepare priority discharge summary,  including medications, Please prepare prescriptions     Additional Comment:   Per MD patient ready for DC to [facility]. RN, patient, patient's family, and facility notified of DC. RN given number for report. DC packet on chart. Ambulance transport requested for patient. CSW signing off.  _______________________________________________ Rigoberto Noel, LCSW 05/19/2016, 1:41 PM

## 2016-05-19 NOTE — Progress Notes (Signed)
Pt d/c to snf by ambulance. Assessment stable. All questions answered. No answer at snf for rn to give report.

## 2016-05-22 DIAGNOSIS — I6990 Unspecified sequelae of unspecified cerebrovascular disease: Secondary | ICD-10-CM | POA: Diagnosis not present

## 2016-05-22 DIAGNOSIS — E119 Type 2 diabetes mellitus without complications: Secondary | ICD-10-CM | POA: Diagnosis not present

## 2016-05-22 DIAGNOSIS — G309 Alzheimer's disease, unspecified: Secondary | ICD-10-CM | POA: Diagnosis not present

## 2016-05-22 DIAGNOSIS — J449 Chronic obstructive pulmonary disease, unspecified: Secondary | ICD-10-CM | POA: Diagnosis not present

## 2016-05-22 DIAGNOSIS — I10 Essential (primary) hypertension: Secondary | ICD-10-CM | POA: Diagnosis not present

## 2016-06-05 DIAGNOSIS — I6523 Occlusion and stenosis of bilateral carotid arteries: Secondary | ICD-10-CM | POA: Diagnosis not present

## 2016-06-05 DIAGNOSIS — E785 Hyperlipidemia, unspecified: Secondary | ICD-10-CM | POA: Diagnosis not present

## 2016-06-05 DIAGNOSIS — I10 Essential (primary) hypertension: Secondary | ICD-10-CM | POA: Diagnosis not present

## 2016-06-05 DIAGNOSIS — E119 Type 2 diabetes mellitus without complications: Secondary | ICD-10-CM | POA: Diagnosis not present

## 2016-07-05 ENCOUNTER — Ambulatory Visit (INDEPENDENT_AMBULATORY_CARE_PROVIDER_SITE_OTHER): Payer: Medicare Other | Admitting: Family Medicine

## 2016-07-05 ENCOUNTER — Encounter: Payer: Self-pay | Admitting: Family Medicine

## 2016-07-05 ENCOUNTER — Other Ambulatory Visit: Payer: Self-pay

## 2016-07-05 ENCOUNTER — Telehealth: Payer: Self-pay | Admitting: Family Medicine

## 2016-07-05 VITALS — BP 134/68 | HR 83 | Temp 98.7°F | Wt 109.0 lb

## 2016-07-05 DIAGNOSIS — E042 Nontoxic multinodular goiter: Secondary | ICD-10-CM | POA: Diagnosis not present

## 2016-07-05 DIAGNOSIS — F0150 Vascular dementia without behavioral disturbance: Secondary | ICD-10-CM

## 2016-07-05 DIAGNOSIS — E782 Mixed hyperlipidemia: Secondary | ICD-10-CM

## 2016-07-05 DIAGNOSIS — I69339 Monoplegia of upper limb following cerebral infarction affecting unspecified side: Secondary | ICD-10-CM | POA: Diagnosis not present

## 2016-07-05 DIAGNOSIS — E0590 Thyrotoxicosis, unspecified without thyrotoxic crisis or storm: Secondary | ICD-10-CM | POA: Diagnosis not present

## 2016-07-05 DIAGNOSIS — I10 Essential (primary) hypertension: Secondary | ICD-10-CM | POA: Diagnosis not present

## 2016-07-05 DIAGNOSIS — E538 Deficiency of other specified B group vitamins: Secondary | ICD-10-CM

## 2016-07-05 DIAGNOSIS — J449 Chronic obstructive pulmonary disease, unspecified: Secondary | ICD-10-CM | POA: Diagnosis not present

## 2016-07-05 DIAGNOSIS — K119 Disease of salivary gland, unspecified: Secondary | ICD-10-CM

## 2016-07-05 DIAGNOSIS — K118 Other diseases of salivary glands: Secondary | ICD-10-CM | POA: Insufficient documentation

## 2016-07-05 DIAGNOSIS — F0280 Dementia in other diseases classified elsewhere without behavioral disturbance: Secondary | ICD-10-CM

## 2016-07-05 DIAGNOSIS — IMO0002 Reserved for concepts with insufficient information to code with codable children: Secondary | ICD-10-CM

## 2016-07-05 DIAGNOSIS — D508 Other iron deficiency anemias: Secondary | ICD-10-CM | POA: Diagnosis not present

## 2016-07-05 DIAGNOSIS — G309 Alzheimer's disease, unspecified: Secondary | ICD-10-CM

## 2016-07-05 DIAGNOSIS — E440 Moderate protein-calorie malnutrition: Secondary | ICD-10-CM

## 2016-07-05 DIAGNOSIS — R918 Other nonspecific abnormal finding of lung field: Secondary | ICD-10-CM

## 2016-07-05 DIAGNOSIS — E113299 Type 2 diabetes mellitus with mild nonproliferative diabetic retinopathy without macular edema, unspecified eye: Secondary | ICD-10-CM

## 2016-07-05 DIAGNOSIS — I63239 Cerebral infarction due to unspecified occlusion or stenosis of unspecified carotid arteries: Secondary | ICD-10-CM

## 2016-07-05 LAB — BAYER DCA HB A1C WAIVED: HB A1C (BAYER DCA - WAIVED): 7.1 % — ABNORMAL HIGH (ref <7.0)

## 2016-07-05 MED ORDER — MEMANTINE HCL 5 MG PO TABS: 5.0000 mg | ORAL_TABLET | Freq: Two times a day (BID) | ORAL | 1 refills | Status: AC

## 2016-07-05 MED ORDER — METOPROLOL TARTRATE 25 MG PO TABS: 12.5000 mg | ORAL_TABLET | Freq: Two times a day (BID) | ORAL | 1 refills | Status: AC

## 2016-07-05 MED ORDER — ATORVASTATIN CALCIUM 40 MG PO TABS: 40.0000 mg | ORAL_TABLET | Freq: Every day | ORAL | 1 refills | Status: DC

## 2016-07-05 MED ORDER — ASPIRIN 325 MG PO TBEC: 325.0000 mg | DELAYED_RELEASE_TABLET | Freq: Every day | ORAL | 4 refills | Status: DC

## 2016-07-05 MED ORDER — DONEPEZIL HCL 10 MG PO TABS: 10.0000 mg | ORAL_TABLET | Freq: Every day | ORAL | 1 refills | Status: AC

## 2016-07-05 MED ORDER — UMECLIDINIUM-VILANTEROL 62.5-25 MCG/INH IN AEPB: 1.0000 | INHALATION_SPRAY | Freq: Every day | RESPIRATORY_TRACT | 3 refills | Status: DC

## 2016-07-05 MED ORDER — CLOPIDOGREL BISULFATE 75 MG PO TABS: 75.0000 mg | ORAL_TABLET | Freq: Every day | ORAL | 1 refills | Status: AC

## 2016-07-05 NOTE — Progress Notes (Signed)
BP 134/68 (BP Location: Left Arm, Patient Position: Standing, Cuff Size: Small)   Pulse 83   Temp 98.7 F (37.1 C)   Wt 109 lb (49.4 kg)   LMP  (LMP Unknown)   SpO2 98%   BMI 20.60 kg/m    Subjective:    Patient ID: Jasmine Arias, female    DOB: 11-12-1934, 81 y.o.   MRN: [id number]  HPI: Jasmine Arias is a 81 y.o. female  Chief Complaint  Patient presents with  . Diabetes    follow up  . Hyperlipidemia    follow up; med refills  . Hypertension    follow up; med refills   [place] Time since discharge: 3 months- has been out of rehab for about a month. Has been at home for the last month, has been out of her medicine today Hospital/facility: [facility] Diagnosis: Acute CVA Procedures/tests: MRI, CTA, ECHO Consultants: Neurology New medications: Plavix, atorvastatin Discharge instructions:  Needs repeat CTA in 3 months and plavix only after that- to see Dr. Erlinda Hong shortly, stable enhancing mass in parotid gland, L upper lobe lung nodule- needs follow up in 6 months, went to SNF following hospitalization Status: stable  HYPERTENSION / [place] Satisfied with current treatment? yes Duration of hypertension: chronic BP monitoring frequency: not checking BP medication side effects: no Duration of hyperlipidemia: chronic Cholesterol medication side effects: no Cholesterol supplements: none Past cholesterol medications: atorvastain (lipitor) Medication compliance: excellent compliance Aspirin: yes Recent stressors: yes Recurrent headaches: no Visual changes: no Palpitations: no Dyspnea: no Chest pain: no Lower extremity edema: no Dizzy/lightheaded: yes  DIABETES Hypoglycemic episodes:no Polydipsia/polyuria: no Visual disturbance: no Chest pain: no Paresthesias: no Glucose Monitoring: no Taking Insulin?: no Blood Pressure Monitoring: not checking Retinal Examination: Not up to Date Foot Exam: Up to Date Diabetic Education: Not  Completed Pneumovax: Up to Date Influenza: Up to Date Aspirin: yes  Relevant past medical, surgical, family and social history reviewed and updated as indicated. Interim medical history since our last visit reviewed. Allergies and medications reviewed and updated.  Review of Systems  Constitutional: Negative.   Respiratory: Negative.   Cardiovascular: Negative.   Psychiatric/Behavioral: Positive for confusion. Negative for agitation, behavioral problems, decreased concentration, dysphoric mood, hallucinations, self-injury, sleep disturbance and suicidal ideas. The patient is not nervous/anxious and is not hyperactive.     Per HPI unless specifically indicated above     Objective:    BP 134/68 (BP Location: Left Arm, Patient Position: Standing, Cuff Size: Small)   Pulse 83   Temp 98.7 F (37.1 C)   Wt 109 lb (49.4 kg)   LMP  (LMP Unknown)   SpO2 98%   BMI 20.60 kg/m   Wt Readings from Last 3 Encounters:  07/05/16 109 lb (49.4 kg)  05/15/16 114 lb (51.7 kg)  04/05/16 106 lb 6.4 oz (48.3 kg)    Physical Exam  Constitutional: She is oriented to person, place, and time. She appears well-developed and well-nourished. No distress.  HENT:  Head: Normocephalic and atraumatic.  Right Ear: Hearing and external ear normal.  Left Ear: Hearing and external ear normal.  Nose: Nose normal.  Mouth/Throat: Oropharynx is clear and moist. No oropharyngeal exudate.  Eyes: Conjunctivae and lids are normal. Right eye exhibits no discharge. Left eye exhibits no discharge. No scleral icterus.  Cardiovascular: Normal rate, regular rhythm and intact distal pulses.  Exam reveals no gallop and no friction rub.   Murmur heard. Pulmonary/Chest: Effort normal and breath sounds normal.  No respiratory distress. She has no wheezes. She has no rales. She exhibits no tenderness.  Musculoskeletal: Normal range of motion.  Neurological: She is alert and oriented to person, place, and time.  Skin: Skin is  warm, dry and intact. No rash noted. She is not diaphoretic. No erythema. No pallor.  Psychiatric: She has a normal mood and affect. Her speech is normal and behavior is normal. Judgment and thought content normal. Cognition and memory are normal.  Nursing note and vitals reviewed.     Assessment & Plan:   Problem List Items Addressed This Visit      Cardiovascular and Mediastinum   Essential hypertension    Better when she stood up. Continue current regimen. Checking labs today. Await results.       Relevant Medications   atorvastatin (LIPITOR) 40 MG tablet   aspirin 325 MG EC tablet   metoprolol tartrate (LOPRESSOR) 25 MG tablet   Other Relevant Orders   Comprehensive metabolic panel   Carotid stenosis, symptomatic, with infarction [facility])    Needs repeat CTA- ordered today. Needs to follow up with neurology shortly.      Relevant Medications   atorvastatin (LIPITOR) 40 MG tablet   aspirin 325 MG EC tablet   metoprolol tartrate (LOPRESSOR) 25 MG tablet   Other Relevant Orders   CT ANGIO HEAD W OR WO CONTRAST     Respiratory   COPD (chronic obstructive pulmonary disease) ([place])    Refills given today. Call with any concerns. Stable.       Relevant Medications   umeclidinium-vilanterol (ANORO ELLIPTA) 62.5-25 MCG/INH AEPB     Endocrine   Type 2 diabetes mellitus with background retinopathy (HCC) - Primary    A1c up to 7.1. Likely due to increased intake. Recheck 3 months. Will not start medication right now.       Relevant Medications   atorvastatin (LIPITOR) 40 MG tablet   aspirin 325 MG EC tablet   Other Relevant Orders   Comprehensive metabolic panel   Bayer DCA Hb A1c Waived (Completed)   Multiple thyroid nodules    Will need repeat US- will discuss at follow up appointment      Relevant Medications   metoprolol tartrate (LOPRESSOR) 25 MG tablet   Hyperthyroidism, subclinical    Rechecking levels today. Await results.       Relevant Medications    metoprolol tartrate (LOPRESSOR) 25 MG tablet   Other Relevant Orders   Comprehensive metabolic panel   TSH     Nervous and Auditory   Mixed Alzheimer's and vascular dementia    Now living with her sisters. Increasing in weight. Taking her medicine. Continue to monitor.       Relevant Medications   donepezil (ARICEPT) 10 MG tablet   memantine (NAMENDA) 5 MG tablet   CVA, old, monoplegia upper limb ([place])    Will get her into Dr. Manuella Ghazi as sister does not want to go to [facility] to follow up with Dr. Erlinda Hong        Other   Malnutrition [facility])    Now living with her sisters. They are feeding her. Continue to monitor closely.      Relevant Orders   Comprehensive metabolic panel   HLD (hyperlipidemia)    Rechecking levels today. Await results. Adjust as needed.       Relevant Medications   atorvastatin (LIPITOR) 40 MG tablet   aspirin 325 MG EC tablet   metoprolol tartrate (LOPRESSOR) 25 MG tablet  Other Relevant Orders   Comprehensive metabolic panel   Lipid Panel w/o Chol/HDL Ratio   Lung mass   B12 deficiency    Rechecking levels today. Await results and adjust as needed.       Relevant Orders   CBC with Differential/Platelet   Comprehensive metabolic panel   123456 and Folate Panel   Iron deficiency anemia    Rechecking levels today. Await results and adjust as needed.       Relevant Orders   CBC with Differential/Platelet   Comprehensive metabolic panel   Parotid mass    Needs to see ENT. Referral made today.       Relevant Orders   Ambulatory referral to ENT    Other Visit Diagnoses   None.      Follow up plan: Return in about 3 months (around 10/05/2016) for DM visit.

## 2016-07-05 NOTE — Telephone Encounter (Signed)
Please see if Dr. Manuella Ghazi at [facility] neuro will see her again for follow up- she is now living with sister and she doesn't want to go to [facility] for the appointment with Dr. Erlinda Hong. Called today, but they are closed Friday PM.

## 2016-07-05 NOTE — Assessment & Plan Note (Signed)
A1c up to 7.1. Likely due to increased intake. Recheck 3 months. Will not start medication right now.

## 2016-07-05 NOTE — Telephone Encounter (Signed)
Pt scheduled for today (07/05/16) @ 1pm with Apolonio Schneiders. Thanks.

## 2016-07-05 NOTE — Assessment & Plan Note (Signed)
Better when she stood up. Continue current regimen. Checking labs today. Await results.

## 2016-07-05 NOTE — Assessment & Plan Note (Signed)
Rechecking levels today. Await results and adjust as needed.

## 2016-07-05 NOTE — Assessment & Plan Note (Signed)
Now living with her sisters. Increasing in weight. Taking her medicine. Continue to monitor.

## 2016-07-05 NOTE — Assessment & Plan Note (Signed)
Will need repeat US- will discuss at follow up appointment

## 2016-07-05 NOTE — Assessment & Plan Note (Signed)
Will get her into Dr. Manuella Ghazi as sister does not want to go to [facility] to follow up with Dr. Erlinda Hong

## 2016-07-05 NOTE — Assessment & Plan Note (Signed)
Rechecking levels today. Await results. Adjust as needed.

## 2016-07-05 NOTE — Assessment & Plan Note (Signed)
Refills given today. Call with any concerns. Stable.

## 2016-07-05 NOTE — Assessment & Plan Note (Signed)
Needs repeat CTA- ordered today. Needs to follow up with neurology shortly.

## 2016-07-05 NOTE — Telephone Encounter (Signed)
Patient needs an appointment please

## 2016-07-05 NOTE — Assessment & Plan Note (Signed)
Now living with her sisters. They are feeding her. Continue to monitor closely.

## 2016-07-05 NOTE — Assessment & Plan Note (Signed)
Rechecking levels today. Await results.  

## 2016-07-05 NOTE — Assessment & Plan Note (Signed)
Needs to see ENT. Referral made today.

## 2016-07-06 LAB — COMPREHENSIVE METABOLIC PANEL
A/G RATIO: 1.4 (ref 1.2–2.2)
ALBUMIN: 3.6 g/dL (ref 3.5–4.8)
ALT: 9 IU/L (ref 0–32)
AST: 13 IU/L (ref 0–40)
Alkaline Phosphatase: 111 IU/L (ref 39–117)
BUN/Creatinine Ratio: 19 (ref 12–28)
BUN: 18 mg/dL (ref 8–27)
Bilirubin Total: <0.2 mg/dL (ref 0.0–1.2)
CALCIUM: 9 mg/dL (ref 8.7–10.3)
CO2: 26 mmol/L (ref 18–29)
CREATININE: 0.96 mg/dL (ref 0.57–1.00)
Chloride: 105 mmol/L (ref 96–106)
GFR, EST AFRICAN AMERICAN: 66 mL/min/{1.73_m2} (ref >59)
GFR, EST NON AFRICAN AMERICAN: 57 mL/min/{1.73_m2} — AB (ref >59)
GLOBULIN, TOTAL: 2.6 g/dL (ref 1.5–4.5)
Glucose: 251 mg/dL — ABNORMAL HIGH (ref 65–99)
POTASSIUM: 3.8 mmol/L (ref 3.5–5.2)
SODIUM: 145 mmol/L — AB (ref 134–144)
TOTAL PROTEIN: 6.2 g/dL (ref 6.0–8.5)

## 2016-07-06 LAB — CBC WITH DIFFERENTIAL/PLATELET
BASOS: 0 %
Basophils Absolute: 0 10*3/uL (ref 0.0–0.2)
EOS (ABSOLUTE): 0.1 10*3/uL (ref 0.0–0.4)
EOS: 2 %
HEMATOCRIT: 34.6 % (ref 34.0–46.6)
HEMOGLOBIN: 11 g/dL — AB (ref 11.1–15.9)
IMMATURE GRANS (ABS): 0 10*3/uL (ref 0.0–0.1)
IMMATURE GRANULOCYTES: 0 %
LYMPHS: 17 %
Lymphocytes Absolute: 1 10*3/uL (ref 0.7–3.1)
MCH: 27.5 pg (ref 26.6–33.0)
MCHC: 31.8 g/dL (ref 31.5–35.7)
MCV: 87 fL (ref 79–97)
MONOCYTES: 4 %
MONOS ABS: 0.2 10*3/uL (ref 0.1–0.9)
NEUTROS PCT: 77 %
Neutrophils Absolute: 4.2 10*3/uL (ref 1.4–7.0)
Platelets: 238 10*3/uL (ref 150–379)
RBC: 4 x10E6/uL (ref 3.77–5.28)
RDW: 14.9 % (ref 12.3–15.4)
WBC: 5.5 10*3/uL (ref 3.4–10.8)

## 2016-07-06 LAB — LIPID PANEL W/O CHOL/HDL RATIO
Cholesterol, Total: 148 mg/dL (ref 100–199)
HDL: 55 mg/dL (ref >39)
LDL Calculated: 77 mg/dL (ref 0–99)
TRIGLYCERIDES: 82 mg/dL (ref 0–149)
VLDL CHOLESTEROL CAL: 16 mg/dL (ref 5–40)

## 2016-07-06 LAB — B12 AND FOLATE PANEL
FOLATE: 10.8 ng/mL (ref >3.0)
Vitamin B-12: 365 pg/mL (ref 211–946)

## 2016-07-06 LAB — TSH: TSH: 0.486 u[IU]/mL (ref 0.450–4.500)

## 2016-07-08 NOTE — Telephone Encounter (Signed)
Patient scheduled for August 06, 2016 @ 10:15am.  Tried to notify patient's sister, but she is at work. Will try again. Jasmine Arias states that if she cancels or no shows again, they will not be able to see her since she has already done this 4 or 5 times, I will notify the sister when I speak with her.

## 2016-07-08 NOTE — Telephone Encounter (Signed)
Patient's sister notified.

## 2016-07-09 ENCOUNTER — Telehealth: Payer: Self-pay | Admitting: Family Medicine

## 2016-07-09 ENCOUNTER — Encounter: Payer: Self-pay | Admitting: Family Medicine

## 2016-07-09 NOTE — Telephone Encounter (Signed)
Call placed to her sister know that her labs look nice and normal.   I reminded them that they should be hearing from the ENT doctor for the spot they saw in her face when she was in the hospital. But that in order for them to see her to please have them change her PCP on her medicare card to me, as the ENT will not see her because I'm not listed on her card. They stated that they would do this.  Because it's going to be a little bit until she sees Dr. Manuella Ghazi again for neurology so she doesn't have to drive to [place], I've put in the referral for her to have her CT scan. She should be hearing about that soon as well.   They voiced understanding

## 2016-07-10 ENCOUNTER — Other Ambulatory Visit: Payer: Self-pay | Admitting: Family Medicine

## 2016-07-10 ENCOUNTER — Telehealth: Payer: Self-pay

## 2016-07-10 DIAGNOSIS — I6522 Occlusion and stenosis of left carotid artery: Secondary | ICD-10-CM

## 2016-07-10 DIAGNOSIS — I63422 Cerebral infarction due to embolism of left anterior cerebral artery: Secondary | ICD-10-CM

## 2016-07-10 DIAGNOSIS — I63239 Cerebral infarction due to unspecified occlusion or stenosis of unspecified carotid arteries: Secondary | ICD-10-CM

## 2016-07-10 DIAGNOSIS — IMO0002 Reserved for concepts with insufficient information to code with codable children: Secondary | ICD-10-CM

## 2016-07-10 NOTE — Telephone Encounter (Signed)
Please place new order for neck [id number]

## 2016-07-11 ENCOUNTER — Ambulatory Visit
Admission: RE | Admit: 2016-07-11 | Discharge: 2016-07-11 | Disposition: A | Discharge status: Home / Self Care | Payer: Medicare Other | Source: Ambulatory Visit | Attending: Family Medicine | Admitting: Family Medicine

## 2016-07-11 DIAGNOSIS — R220 Localized swelling, mass and lump, head: Secondary | ICD-10-CM | POA: Insufficient documentation

## 2016-07-11 DIAGNOSIS — R42 Dizziness and giddiness: Secondary | ICD-10-CM | POA: Diagnosis not present

## 2016-07-11 DIAGNOSIS — IMO0002 Reserved for concepts with insufficient information to code with codable children: Secondary | ICD-10-CM

## 2016-07-11 DIAGNOSIS — I6522 Occlusion and stenosis of left carotid artery: Secondary | ICD-10-CM

## 2016-07-11 DIAGNOSIS — I6501 Occlusion and stenosis of right vertebral artery: Secondary | ICD-10-CM | POA: Insufficient documentation

## 2016-07-11 DIAGNOSIS — R911 Solitary pulmonary nodule: Secondary | ICD-10-CM | POA: Insufficient documentation

## 2016-07-11 DIAGNOSIS — I63422 Cerebral infarction due to embolism of left anterior cerebral artery: Secondary | ICD-10-CM | POA: Diagnosis not present

## 2016-07-11 DIAGNOSIS — I69339 Monoplegia of upper limb following cerebral infarction affecting unspecified side: Secondary | ICD-10-CM | POA: Diagnosis not present

## 2016-07-11 DIAGNOSIS — I63239 Cerebral infarction due to unspecified occlusion or stenosis of unspecified carotid arteries: Secondary | ICD-10-CM

## 2016-07-11 DIAGNOSIS — I771 Stricture of artery: Secondary | ICD-10-CM | POA: Insufficient documentation

## 2016-07-11 DIAGNOSIS — I728 Aneurysm of other specified arteries: Secondary | ICD-10-CM | POA: Insufficient documentation

## 2016-07-11 DIAGNOSIS — I6523 Occlusion and stenosis of bilateral carotid arteries: Secondary | ICD-10-CM | POA: Diagnosis not present

## 2016-07-11 MED ORDER — IOPAMIDOL (ISOVUE-370) INJECTION 76%
75.0000 mL | Freq: Once | INTRAVENOUS | Status: AC | PRN
  Administered 2016-07-11: 75 mL via INTRAVENOUS

## 2016-07-12 ENCOUNTER — Encounter: Payer: Self-pay | Admitting: Family Medicine

## 2016-07-12 ENCOUNTER — Telehealth: Payer: Self-pay | Admitting: Family Medicine

## 2016-07-12 NOTE — Telephone Encounter (Signed)
Patients sister notified.   Unable to get in touch with [facility], will try again, to cancel patients appointment. Tel: [phone]

## 2016-07-12 NOTE — Telephone Encounter (Signed)
Can you please let her sister know that her CT looked stable and that they will discuss it further with them at her appointment with Dr. Manuella Ghazi 11/7? Also- they haven't cancelled her appointment with Dr. Erlinda Hong, can we do that for 11/1? Thanks!

## 2016-07-31 ENCOUNTER — Ambulatory Visit: Payer: Self-pay | Admitting: Neurology

## 2016-08-15 ENCOUNTER — Ambulatory Visit: Payer: Medicare Other | Admitting: Family Medicine

## 2016-09-03 ENCOUNTER — Ambulatory Visit: Payer: Medicare Other | Admitting: Family Medicine

## 2016-10-02 ENCOUNTER — Other Ambulatory Visit: Payer: Self-pay | Admitting: Family Medicine

## 2016-11-08 ENCOUNTER — Ambulatory Visit (INDEPENDENT_AMBULATORY_CARE_PROVIDER_SITE_OTHER): Payer: Medicare Other | Admitting: Family Medicine

## 2016-11-08 ENCOUNTER — Encounter: Payer: Self-pay | Admitting: Family Medicine

## 2016-11-08 VITALS — BP 193/90 | HR 61 | Temp 98.0°F | Ht 59.0 in | Wt 110.8 lb

## 2016-11-08 DIAGNOSIS — S81001A Unspecified open wound, right knee, initial encounter: Secondary | ICD-10-CM

## 2016-11-08 DIAGNOSIS — I63239 Cerebral infarction due to unspecified occlusion or stenosis of unspecified carotid arteries: Secondary | ICD-10-CM | POA: Diagnosis not present

## 2016-11-08 DIAGNOSIS — F17200 Nicotine dependence, unspecified, uncomplicated: Secondary | ICD-10-CM

## 2016-11-08 DIAGNOSIS — J449 Chronic obstructive pulmonary disease, unspecified: Secondary | ICD-10-CM

## 2016-11-08 DIAGNOSIS — Z23 Encounter for immunization: Secondary | ICD-10-CM | POA: Diagnosis not present

## 2016-11-08 DIAGNOSIS — I700 Atherosclerosis of aorta: Secondary | ICD-10-CM

## 2016-11-08 DIAGNOSIS — E782 Mixed hyperlipidemia: Secondary | ICD-10-CM

## 2016-11-08 DIAGNOSIS — F0280 Dementia in other diseases classified elsewhere without behavioral disturbance: Secondary | ICD-10-CM

## 2016-11-08 DIAGNOSIS — D751 Secondary polycythemia: Secondary | ICD-10-CM

## 2016-11-08 DIAGNOSIS — E113299 Type 2 diabetes mellitus with mild nonproliferative diabetic retinopathy without macular edema, unspecified eye: Secondary | ICD-10-CM

## 2016-11-08 DIAGNOSIS — E538 Deficiency of other specified B group vitamins: Secondary | ICD-10-CM | POA: Diagnosis not present

## 2016-11-08 DIAGNOSIS — I10 Essential (primary) hypertension: Secondary | ICD-10-CM

## 2016-11-08 DIAGNOSIS — G309 Alzheimer's disease, unspecified: Secondary | ICD-10-CM

## 2016-11-08 DIAGNOSIS — F0150 Vascular dementia without behavioral disturbance: Secondary | ICD-10-CM

## 2016-11-08 DIAGNOSIS — E0590 Thyrotoxicosis, unspecified without thyrotoxic crisis or storm: Secondary | ICD-10-CM

## 2016-11-08 DIAGNOSIS — R011 Cardiac murmur, unspecified: Secondary | ICD-10-CM

## 2016-11-08 MED ORDER — UMECLIDINIUM-VILANTEROL 62.5-25 MCG/INH IN AEPB: 1.0000 | INHALATION_SPRAY | Freq: Every day | RESPIRATORY_TRACT | 3 refills | Status: DC

## 2016-11-08 MED ORDER — ATORVASTATIN CALCIUM 40 MG PO TABS: ORAL_TABLET | ORAL | 1 refills | Status: AC

## 2016-11-08 MED ORDER — LISINOPRIL 20 MG PO TABS: 20.0000 mg | ORAL_TABLET | Freq: Every day | ORAL | 3 refills | Status: DC

## 2016-11-08 NOTE — Assessment & Plan Note (Signed)
Under very poor control. Will start lisinopril and recheck 2 weeks.

## 2016-11-08 NOTE — Progress Notes (Signed)
BP (!) 193/90   Pulse 61   Temp 98 F (36.7 C)   Ht 4\' 11"  (1.499 m)   Wt 110 lb 12.8 oz (50.3 kg)   LMP  (LMP Unknown)   SpO2 97%   BMI 22.38 kg/m    Subjective:    Patient ID: Jasmine Arias, female    DOB: 06/10/1935, 82 y.o.   MRN: [id number]  HPI: Jasmine Arias is a 82 y.o. female  Chief Complaint  Patient presents with  . Diabetes  . Hypertension  . Hyperlipidemia   DIABETES Hypoglycemic episodes:no Polydipsia/polyuria: no Visual disturbance: no Chest pain: no Paresthesias: no Glucose Monitoring: no Taking Insulin?: no Blood Pressure Monitoring: not checking Retinal Examination: Up to Date Foot Exam: Up to Date Diabetic Education: Not Completed Pneumovax: Up to Date Influenza: Up to Date Aspirin: no  HYPERTENSION / HYPERLIPIDEMIA Satisfied with current treatment? no Duration of hypertension: chronic BP monitoring frequency: not checking BP range:  BP medication side effects: no Past BP meds: hctz, metoprolol Duration of hyperlipidemia: chronic Cholesterol medication side effects: no Cholesterol supplements: none Past cholesterol medications: atorvastain (lipitor) Medication compliance: excellent compliance Aspirin: yes Recent stressors: no Recurrent headaches: no Visual changes: no Palpitations: no Dyspnea: no Chest pain: no Lower extremity edema: no Dizzy/lightheaded: yes  Relevant past medical, surgical, family and social history reviewed and updated as indicated. Interim medical history since our last visit reviewed. Allergies and medications reviewed and updated.  Review of Systems  Constitutional: Negative.   Respiratory: Negative.   Cardiovascular: Negative.   Skin: Positive for wound. Negative for color change, pallor and rash.  Psychiatric/Behavioral: Positive for confusion. Negative for agitation, behavioral problems, decreased concentration, dysphoric mood, hallucinations, self-injury, sleep disturbance and suicidal ideas.  The patient is not nervous/anxious and is not hyperactive.     Per HPI unless specifically indicated above     Objective:    BP (!) 193/90   Pulse 61   Temp 98 F (36.7 C)   Ht 4\' 11"  (1.499 m)   Wt 110 lb 12.8 oz (50.3 kg)   LMP  (LMP Unknown)   SpO2 97%   BMI 22.38 kg/m   Wt Readings from Last 3 Encounters:  11/08/16 110 lb 12.8 oz (50.3 kg)  07/05/16 109 lb (49.4 kg)  05/15/16 114 lb (51.7 kg)    Physical Exam  Constitutional: She is oriented to person, place, and time. She appears well-developed and well-nourished. No distress.  HENT:  Head: Normocephalic and atraumatic.  Right Ear: Hearing normal.  Left Ear: Hearing normal.  Nose: Nose normal.  Eyes: Conjunctivae and lids are normal. Right eye exhibits no discharge. Left eye exhibits no discharge. No scleral icterus.  Cardiovascular: Normal rate, regular rhythm and intact distal pulses.  Exam reveals no gallop and no friction rub.   Murmur heard. Pulmonary/Chest: Effort normal and breath sounds normal. No respiratory distress. She has no wheezes. She has no rales. She exhibits no tenderness.  Musculoskeletal: Normal range of motion.  Neurological: She is alert and oriented to person, place, and time.  Skin: Skin is warm, dry and intact. No rash noted. She is not diaphoretic. No erythema. No pallor.  Well healing 1cm scrape R knee  Psychiatric: She has a normal mood and affect. Her speech is normal and behavior is normal. Judgment and thought content normal. Cognition and memory are normal.  Nursing note and vitals reviewed.   Results for orders placed or performed in visit on 07/05/16  CBC with  Differential/Platelet  Result Value Ref Range   WBC 5.5 3.4 - 10.8 x10E3/uL   RBC 4.00 3.77 - 5.28 x10E6/uL   Hemoglobin 11.0 (L) 11.1 - 15.9 g/dL   Hematocrit 34.6 34.0 - 46.6 %   MCV 87 79 - 97 fL   MCH 27.5 26.6 - 33.0 pg   MCHC 31.8 31.5 - 35.7 g/dL   RDW 14.9 12.3 - 15.4 %   Platelets 238 150 - 379 x10E3/uL    Neutrophils 77 Not Estab. %   Lymphs 17 Not Estab. %   Monocytes 4 Not Estab. %   Eos 2 Not Estab. %   Basos 0 Not Estab. %   Neutrophils Absolute 4.2 1.4 - 7.0 x10E3/uL   Lymphocytes Absolute 1.0 0.7 - 3.1 x10E3/uL   Monocytes Absolute 0.2 0.1 - 0.9 x10E3/uL   EOS (ABSOLUTE) 0.1 0.0 - 0.4 x10E3/uL   Basophils Absolute 0.0 0.0 - 0.2 x10E3/uL   Immature Granulocytes 0 Not Estab. %   Immature Grans (Abs) 0.0 0.0 - 0.1 x10E3/uL  Comprehensive metabolic panel  Result Value Ref Range   Glucose 251 (H) 65 - 99 mg/dL   BUN 18 8 - 27 mg/dL   Creatinine, Ser 0.96 0.57 - 1.00 mg/dL   GFR calc non Af Amer 57 (L) >59 mL/min/1.73   GFR calc Af Amer 66 >59 mL/min/1.73   BUN/Creatinine Ratio 19 12 - 28   Sodium 145 (H) 134 - 144 mmol/L   Potassium 3.8 3.5 - 5.2 mmol/L   Chloride 105 96 - 106 mmol/L   CO2 26 18 - 29 mmol/L   Calcium 9.0 8.7 - 10.3 mg/dL   Total Protein 6.2 6.0 - 8.5 g/dL   Albumin 3.6 3.5 - 4.8 g/dL   Globulin, Total 2.6 1.5 - 4.5 g/dL   Albumin/Globulin Ratio 1.4 1.2 - 2.2   Bilirubin Total <0.2 0.0 - 1.2 mg/dL   Alkaline Phosphatase 111 39 - 117 IU/L   AST 13 0 - 40 IU/L   ALT 9 0 - 32 IU/L  Lipid Panel w/o Chol/HDL Ratio  Result Value Ref Range   Cholesterol, Total 148 100 - 199 mg/dL   Triglycerides 82 0 - 149 mg/dL   HDL 55 >39 mg/dL   VLDL Cholesterol Cal 16 5 - 40 mg/dL   LDL Calculated 77 0 - 99 mg/dL  TSH  Result Value Ref Range   TSH 0.486 0.450 - 4.500 uIU/mL  B12 and Folate Panel  Result Value Ref Range   Vitamin B-12 365 211 - 946 pg/mL   Folate 10.8 >3.0 ng/mL  Bayer DCA Hb A1c Waived  Result Value Ref Range   Bayer DCA Hb A1c Waived 7.1 (H) <7.0 %      Assessment & Plan:   Problem List Items Addressed This Visit      Cardiovascular and Mediastinum   Essential hypertension - Primary    Under very poor control. Will start lisinopril and recheck 2 weeks.       Relevant Medications   lisinopril (PRINIVIL,ZESTRIL) 20 MG tablet   atorvastatin  (LIPITOR) 40 MG tablet   Other Relevant Orders   Comprehensive metabolic panel   Microalbumin, Urine Waived   Aortic atherosclerosis ([place])    Will attempt to keep BP and cholesterol under good control. Medical management.       Relevant Medications   lisinopril (PRINIVIL,ZESTRIL) 20 MG tablet   atorvastatin (LIPITOR) 40 MG tablet   Carotid stenosis, symptomatic, with infarction ([place])  Continue to follow with vascular. Continue plavix and aspirin.      Relevant Medications   lisinopril (PRINIVIL,ZESTRIL) 20 MG tablet   atorvastatin (LIPITOR) 40 MG tablet     Respiratory   COPD (chronic obstructive pulmonary disease) (HCC)    Lungs clear. Continue current regimen. Continue to monitor. Call with any concerns.       Relevant Medications   umeclidinium-vilanterol (ANORO ELLIPTA) 62.5-25 MCG/INH AEPB     Endocrine   Type 2 diabetes mellitus with background retinopathy (HCC)    A1c at 7.0 today. Will continue to monitor. Recheck 3 months.       Relevant Medications   lisinopril (PRINIVIL,ZESTRIL) 20 MG tablet   atorvastatin (LIPITOR) 40 MG tablet   Other Relevant Orders   Bayer DCA Hb A1c Waived   Comprehensive metabolic panel   Microalbumin, Urine Waived   UA/M w/rflx Culture, Routine   Hyperthyroidism, subclinical    Will recheck labs today. Await results.       Relevant Orders   Comprehensive metabolic panel   TSH     Nervous and Auditory   Mixed Alzheimer's and vascular dementia    Good family support. They are looking to place her in an assisted living. Continue current regimen. Continue to monitor.         Other   HLD (hyperlipidemia)    Rechecking levels today. Continue to monitor.       Relevant Medications   lisinopril (PRINIVIL,ZESTRIL) 20 MG tablet   atorvastatin (LIPITOR) 40 MG tablet   Other Relevant Orders   Comprehensive metabolic panel   Lipid Panel w/o Chol/HDL Ratio   Cardiac murmur    Stable. Continue to monitor.       Polycythemia     Rechecking levels today. Await results.       Relevant Orders   CBC with Differential/Platelet   Comprehensive metabolic panel   Compulsive tobacco user syndrome    Checking urine. Continue to monitor.       Relevant Orders   Comprehensive metabolic panel   UA/M w/rflx Culture, Routine   B12 deficiency    Rechecking levels today.      Relevant Orders   Comprehensive metabolic panel   123456 and Folate Panel    Other Visit Diagnoses    Open wound of right knee, initial encounter       Healing well. Due for Td- will give today.   Relevant Orders   Td : Tetanus/diphtheria >7yo Preservative  free (Completed)   Immunization due       Prevnar due today. Given today.   Relevant Orders   Pneumococcal conjugate vaccine 13-valent (Completed)       Follow up plan: Return in about 2 weeks (around 11/22/2016) for Recheck BP.

## 2016-11-08 NOTE — Assessment & Plan Note (Signed)
Rechecking levels today. Await results.  

## 2016-11-08 NOTE — Assessment & Plan Note (Signed)
A1c at 7.0 today. Will continue to monitor. Recheck 3 months.

## 2016-11-08 NOTE — Assessment & Plan Note (Signed)
Will recheck labs today. Await results.

## 2016-11-08 NOTE — Assessment & Plan Note (Signed)
Will attempt to keep BP and cholesterol under good control. Medical management.

## 2016-11-08 NOTE — Assessment & Plan Note (Signed)
Continue to follow with vascular. Continue plavix and aspirin.

## 2016-11-08 NOTE — Assessment & Plan Note (Signed)
Lungs clear. Continue current regimen. Continue to monitor. Call with any concerns.  

## 2016-11-08 NOTE — Assessment & Plan Note (Signed)
Good family support. They are looking to place her in an assisted living. Continue current regimen. Continue to monitor.

## 2016-11-08 NOTE — Assessment & Plan Note (Signed)
Rechecking levels today. Continue to monitor.  

## 2016-11-08 NOTE — Patient Instructions (Addendum)
Pneumococcal Conjugate Vaccine (PCV13) What You Need to Know 1. Why get vaccinated? Vaccination can protect both children and adults from pneumococcal disease. Pneumococcal disease is caused by bacteria that can spread from person to person through close contact. It can cause ear infections, and it can also lead to more serious infections of the:  Lungs (pneumonia),  Blood (bacteremia), and  Covering of the brain and spinal cord (meningitis). Pneumococcal pneumonia is most common among adults. Pneumococcal meningitis can cause deafness and brain damage, and it kills about 1 child in 10 who get it. Anyone can get pneumococcal disease, but children under 48 years of age and adults 74 years and older, people with certain medical conditions, and cigarette smokers are at the highest risk. Before there was a vaccine, the [place] saw:  more than 700 cases of meningitis,  about 13,000 blood infections,  about 5 million ear infections, and  about 200 deaths in children under 5 each year from pneumococcal disease. Since vaccine became available, severe pneumococcal disease in these children has fallen by 88%. About 18,000 older adults die of pneumococcal disease each year in the [place]. Treatment of pneumococcal infections with penicillin and other drugs is not as effective as it used to be, because some strains of the disease have become resistant to these drugs. This makes prevention of the disease, through vaccination, even more important. 2. PCV13 vaccine Pneumococcal conjugate vaccine (called PCV13) protects against 13 types of pneumococcal bacteria. PCV13 is routinely given to children at 2, 4, 6, and 61-19 months of age. It is also recommended for children and adults 68 to 2 years of age with certain health conditions, and for all adults 52 years of age and older. Your doctor can give you details. 3. Some people should not get this vaccine Anyone who has ever had a  life-threatening allergic reaction to a dose of this vaccine, to an earlier pneumococcal vaccine called PCV7, or to any vaccine containing diphtheria toxoid (for example, DTaP), should not get PCV13. Anyone with a severe allergy to any component of PCV13 should not get the vaccine. Tell your doctor if the person being vaccinated has any severe allergies. If the person scheduled for vaccination is not feeling well, your healthcare provider might decide to reschedule the shot on another day. 4. Risks of a vaccine reaction With any medicine, including vaccines, there is a chance of reactions. These are usually mild and go away on their own, but serious reactions are also possible. Problems reported following PCV13 varied by age and dose in the series. The most common problems reported among children were:  About half became drowsy after the shot, had a temporary loss of appetite, or had redness or tenderness where the shot was given.  About 1 out of 3 had swelling where the shot was given.  About 1 out of 3 had a mild fever, and about 1 in 20 had a fever over 102.43F.  Up to about 8 out of 10 became fussy or irritable. Adults have reported pain, redness, and swelling where the shot was given; also mild fever, fatigue, headache, chills, or muscle pain. Young children who get PCV13 along with inactivated flu vaccine at the same time may be at increased risk for seizures caused by fever. Ask your doctor for more information. Problems that could happen after any vaccine:  People sometimes faint after a medical procedure, including vaccination. Sitting or lying down for about 15 minutes can help prevent fainting, and injuries  caused by a fall. Tell your doctor if you feel dizzy, or have vision changes or ringing in the ears.  Some older children and adults get severe pain in the shoulder and have difficulty moving the arm where a shot was given. This happens very rarely.  Any medication can cause a  severe allergic reaction. Such reactions from a vaccine are very rare, estimated at about 1 in a million doses, and would happen within a few minutes to a few hours after the vaccination. As with any medicine, there is a very small chance of a vaccine causing a serious injury or death. The safety of vaccines is always being monitored. For more information, visit: http://www.aguilar.org/ 5. What if there is a serious reaction? What should I look for? Look for anything that concerns you, such as signs of a severe allergic reaction, very high fever, or unusual behavior. Signs of a severe allergic reaction can include hives, swelling of the face and throat, difficulty breathing, a fast heartbeat, dizziness, and weakness-usually within a few minutes to a few hours after the vaccination. What should I do?  If you think it is a severe allergic reaction or other emergency that can't wait, call 9-1-1 or get the person to the nearest hospital. Otherwise, call your doctor.  Reactions should be reported to the Vaccine Adverse Event Reporting System (VAERS). Your doctor should file this report, or you can do it yourself through the VAERS web site at www.vaers.SamedayNews.es, or by calling 773-433-6284.  VAERS does not give medical advice. 6. The National Vaccine Injury Compensation Program The [employer] Vaccine Injury Compensation Program (VICP) is a federal program that was created to compensate people who may have been injured by certain vaccines. Persons who believe they may have been injured by a vaccine can learn about the program and about filing a claim by calling 304 555 3392 or visiting the [place] website at GoldCloset.com.ee. There is a time limit to file a claim for compensation. 7. How can I learn more?  Ask your healthcare provider. He or she can give you the vaccine package insert or suggest other sources of information.  Call your local or state health department.  Contact the  Centers for Disease Control and Prevention (CDC):  Call (629)481-1219 (1-800-CDC-INFO) or  Visit CDC's website at http://hunter.com/ Vaccine Information Statement, PCV13 Vaccine (08/04/2014) This information is not intended to replace advice given to you by your health care provider. Make sure you discuss any questions you have with your health care provider. Document Released: 07/14/2006 Document Revised: 06/06/2016 Document Reviewed: 06/06/2016 Elsevier Interactive Patient Education  2017 [place] (Tetanus and Diphtheria): What You Need to Know 1. Why get vaccinated? Tetanus  and diphtheria are very serious diseases. They are rare in the [place] today, but people who do become infected often have severe complications. Td vaccine is used to protect adolescents and adults from both of these diseases. Both tetanus and diphtheria are infections caused by bacteria. Diphtheria spreads from person to person through coughing or sneezing. Tetanus-causing bacteria enter the body through cuts, scratches, or wounds. TETANUS (lockjaw) causes painful muscle tightening and stiffness, usually all over the body.  It can lead to tightening of muscles in the head and neck so you can't open your mouth, swallow, or sometimes even breathe. Tetanus kills about 1 out of every 10 people who are infected even after receiving the best medical care. DIPHTHERIA can cause a thick coating to form in the back of the throat.  It can lead to breathing problems, paralysis, heart failure, and death. Before vaccines, as many as 200,000 cases of diphtheria and hundreds of cases of tetanus were reported in the [place] each year. Since vaccination began, reports of cases for both diseases have dropped by about 99%. 2. Td vaccine Td vaccine can protect adolescents and adults from tetanus and diphtheria. Td is usually given as a booster dose every 10 years but it can also be given earlier after a severe  and dirty wound or burn. Another vaccine, called Tdap, which protects against pertussis in addition to tetanus and diphtheria, is sometimes recommended instead of Td vaccine. Your doctor or the person giving you the vaccine can give you more information. Td may safely be given at the same time as other vaccines. 3. Some people should not get this vaccine  A person who has ever had a life-threatening allergic reaction after a previous dose of any tetanus or diphtheria containing vaccine, OR has a severe allergy to any part of this vaccine, should not get Td vaccine. Tell the person giving the vaccine about any severe allergies.  Talk to your doctor if you:  had severe pain or swelling after any vaccine containing diphtheria or tetanus,  ever had a condition called Guillain Barre Syndrome (GBS),  aren't feeling well on the day the shot is scheduled. 4. What are the risks from Td vaccine? With any medicine, including vaccines, there is a chance of side effects. These are usually mild and go away on their own. Serious reactions are also possible but are rare. Most people who get Td vaccine do not have any problems with it. Mild problems following Td vaccine: (Did not interfere with activities)  Pain where the shot was given (about 8 people in 10)  Redness or swelling where the shot was given (about 1 person in 4)  Mild fever (rare)  Headache (about 1 person in 4)  Tiredness (about 1 person in 4) Moderate problems following Td vaccine: (Interfered with activities, but did not require medical attention)  Fever over 102F (rare) Severe problems following Td vaccine: (Unable to perform usual activities; required medical attention)  Swelling, severe pain, bleeding and/or redness in the arm where the shot was given (rare). Problems that could happen after any vaccine:  People sometimes faint after a medical procedure, including vaccination. Sitting or lying down for about 15 minutes can  help prevent fainting, and injuries caused by a fall. Tell your doctor if you feel dizzy, or have vision changes or ringing in the ears.  Some people get severe pain in the shoulder and have difficulty moving the arm where a shot was given. This happens very rarely.  Any medication can cause a severe allergic reaction. Such reactions from a vaccine are very rare, estimated at fewer than 1 in a million doses, and would happen within a few minutes to a few hours after the vaccination. As with any medicine, there is a very remote chance of a vaccine causing a serious injury or death. The safety of vaccines is always being monitored. For more information, visit: http://www.aguilar.org/ 5. What if there is a serious reaction? What should I look for? Look for anything that concerns you, such as signs of a severe allergic reaction, very high fever, or unusual behavior. Signs of a severe allergic reaction can include hives, swelling of the face and throat, difficulty breathing, a fast heartbeat, dizziness, and weakness. These would usually start a few minutes to a few  hours after the vaccination. What should I do?  If you think it is a severe allergic reaction or other emergency that can't wait, call 9-1-1 or get the person to the nearest hospital. Otherwise, call your doctor.  Afterward, the reaction should be reported to the Vaccine Adverse Event Reporting System (VAERS). Your doctor might file this report, or you can do it yourself through the VAERS web site at www.vaers.SamedayNews.es, or by calling [phone].  VAERS does not give medical advice. 6. The National Vaccine Injury Compensation Program The [employer] Vaccine Injury Compensation Program (VICP) is a federal program that was created to compensate people who may have been injured by certain vaccines. Persons who believe they may have been injured by a vaccine can learn about the program and about filing a claim by calling 719-419-8440 or  visiting the [place] website at GoldCloset.com.ee. There is a time limit to file a claim for compensation. 7. How can I learn more?  Ask your doctor. He or she can give you the vaccine package insert or suggest other sources of information.  Call your local or state health department.  Contact the Centers for Disease Control and Prevention (CDC):  Call (858)311-8753 (1-800-CDC-INFO)  Visit CDC's website at http://hunter.com/ CDC Td Vaccine VIS (01/09/16) This information is not intended to replace advice given to you by your health care provider. Make sure you discuss any questions you have with your health care provider. Document Released: 07/14/2006 Document Revised: 06/06/2016 Document Reviewed: 06/06/2016 Elsevier Interactive Patient Education  2017 [place].  Vascular Dementia Introduction Dementia is a condition in which a person has problems with thinking, memory, and behavior that are severe enough to interfere with daily life. Vascular dementia is a type of dementia. It results from brain damage that is caused by the brain not getting enough blood. Vascular dementia usually begins between 39 and 68 years of age. What are the causes? Vascular dementia is caused by conditions that lessen blood flow to the brain. Common causes include:  Multiple small strokes. These may happen without symptoms (silent stroke).  Major stroke.  Damage to small blood vessels in the brain (cerebral small vessel disease). What increases the risk?  Advancing age.  Having had a stroke.  Having high blood pressure (hypertension) or high cholesterol.  Having a disease that affects the heart or blood vessels.  Smoking.  Having diabetes.  Being female.  Being obese.  Not being active.  Having depression. What are the signs or symptoms? Symptoms can vary a lot from one person to another. Symptoms may be mild or severe depending on the amount of damage and which parts of  the brain have been affected. Symptoms may begin suddenly or may develop gradually. Symptoms may remain stable, or they may get worse over time. Symptoms of vascular dementia may be similar to those of Alzheimer disease. The two conditions can occur together (mixed dementia). Symptoms of vascular dementia may include: Mental  Confusion.  Memory problems.  Poor attention and concentration.  Trouble understanding speech.  Depression.  Personality changes.  Trouble recognizing familiar people.  Agitation or aggression.  Paranoia.  Delusions or hallucinations. Physical  Weakness.  Poor balance.  Loss of bladder or bowel control (incontinence).  Unsteady walking (gait).  Speaking problems. Behavioral  Getting lost in familiar places.  Problems with planning and judgment.  Trouble following instructions.  Social problems.  Emotional outbursts.  Trouble with daily activities and self-care.  Problems handling money. How is this diagnosed? There is  not a specific test to diagnose vascular dementia. The health care provider will consider the person's medical history and symptoms or changes that are reported by friends and family. The health care provider will do a physical exam and may order lab tests or other tests that check brain and nervous system function. Tests that may be done include:  Blood tests.  Brain imaging tests.  Tests of movement, speech, and other daily activities (neurological exam).  Tests of memory, thinking, and problem-solving (neuropsychological or neurocognitive testing). Diagnosis may involve several specialists. These may include a health care provider who specializes in the brain and nervous system (neurologist), a provider who specializes in disorders of the mind (psychiatrist), and a provider who focuses on speech and language changes (Electrical engineer). How is this treated? There is no cure for vascular dementia. Brain damage that  has already occurred cannot be reversed. Treatment depends on:  How severe the condition is.  Which parts of the brain have been affected.  The person's overall health. Treatment measures aim to:  Treat the underlying cause of vascular dementia and manage risk factors. This may include:  Controlling blood pressure.  Lowering cholesterol.  Treating diabetes.  Quitting smoking.  Losing weight.  Manage symptoms.  Prevent further brain damage.  Improve the person's health and quality of life. Treatment for dementia may involve a team of health care providers, including:  A neurologist.  A psychiatrist.  An occupational therapist.  A speech pathologist.  A cardiologist.  An exercise physiologist or physical therapist. Follow these instructions at home: Home care for a person with vascular dementia depends on what caused the condition and how severe the symptoms are. General guidelines for care at home include:  Following the health care provider's instructions for treating the condition that caused the dementia.  Using medicines only as told by the person's health care provider.  Creating a safe living space.  Learning ways to help the person remember people, appointments, and daily activities.  Finding a support group to help caregivers and family to cope with the effects of dementia.  Helping family and friends learn about ways to communicate with a person who has dementia.  Making sure the person keeps all follow-up visits and goes to all rehabilitation appointments as told by the health care team. This is important. Contact a health care provider if:  A fever develops.  New behavioral problems develop.  Problems with swallowing develop.  Confusion gets worse.  Sleepiness gets worse. Get help right away if:  Loss of consciousness occurs.  There is a sudden loss of speech, balance, or thinking ability.  New numbness or paralysis occurs.  Sudden,  severe headache occurs.  Vision is lost or suddenly gets worse in one or both eyes. This information is not intended to replace advice given to you by your health care provider. Make sure you discuss any questions you have with your health care provider. Document Released: 09/06/2002 Document Revised: 02/22/2016 Document Reviewed: 12/28/2014  2017 Elsevier

## 2016-11-08 NOTE — Assessment & Plan Note (Signed)
Checking urine. Continue to monitor.

## 2016-11-08 NOTE — Assessment & Plan Note (Signed)
Rechecking levels today. 

## 2016-11-08 NOTE — Assessment & Plan Note (Signed)
Stable.       - Continue to monitor

## 2016-11-09 LAB — COMPREHENSIVE METABOLIC PANEL
A/G RATIO: 1.3 (ref 1.2–2.2)
ALK PHOS: 117 IU/L (ref 39–117)
ALT: 5 IU/L (ref 0–32)
AST: 13 IU/L (ref 0–40)
Albumin: 3.8 g/dL (ref 3.5–4.8)
BILIRUBIN TOTAL: 0.2 mg/dL (ref 0.0–1.2)
BUN/Creatinine Ratio: 8 — ABNORMAL LOW (ref 12–28)
BUN: 8 mg/dL (ref 8–27)
CHLORIDE: 101 mmol/L (ref 96–106)
CO2: 26 mmol/L (ref 18–29)
Calcium: 9.3 mg/dL (ref 8.7–10.3)
Creatinine, Ser: 0.97 mg/dL (ref 0.57–1.00)
GFR calc Af Amer: 64 mL/min/{1.73_m2} (ref >59)
GFR calc non Af Amer: 56 mL/min/{1.73_m2} — ABNORMAL LOW (ref >59)
GLOBULIN, TOTAL: 3 g/dL (ref 1.5–4.5)
Glucose: 105 mg/dL — ABNORMAL HIGH (ref 65–99)
POTASSIUM: 3.7 mmol/L (ref 3.5–5.2)
SODIUM: 144 mmol/L (ref 134–144)
Total Protein: 6.8 g/dL (ref 6.0–8.5)

## 2016-11-09 LAB — B12 AND FOLATE PANEL
Folate: 10 ng/mL (ref >3.0)
VITAMIN B 12: 386 pg/mL (ref 232–1245)

## 2016-11-09 LAB — UA/M W/RFLX CULTURE, ROUTINE
BILIRUBIN UA: NEGATIVE
Glucose, UA: NEGATIVE
Ketones, UA: NEGATIVE
LEUKOCYTES UA: NEGATIVE
NITRITE UA: NEGATIVE
RBC, UA: NEGATIVE
SPEC GRAV UA: 1.015 (ref 1.005–1.030)
UUROB: 0.2 mg/dL (ref 0.2–1.0)
pH, UA: 7 (ref 5.0–7.5)

## 2016-11-09 LAB — BAYER DCA HB A1C WAIVED: HB A1C (BAYER DCA - WAIVED): 7 % — ABNORMAL HIGH (ref <7.0)

## 2016-11-09 LAB — CBC WITH DIFFERENTIAL/PLATELET
BASOS: 0 %
Basophils Absolute: 0 10*3/uL (ref 0.0–0.2)
EOS (ABSOLUTE): 0.1 10*3/uL (ref 0.0–0.4)
Eos: 1 %
HEMATOCRIT: 42.3 % (ref 34.0–46.6)
Hemoglobin: 13.5 g/dL (ref 11.1–15.9)
Immature Grans (Abs): 0 10*3/uL (ref 0.0–0.1)
Immature Granulocytes: 0 %
LYMPHS ABS: 1.5 10*3/uL (ref 0.7–3.1)
Lymphs: 27 %
MCH: 26.8 pg (ref 26.6–33.0)
MCHC: 31.9 g/dL (ref 31.5–35.7)
MCV: 84 fL (ref 79–97)
MONOS ABS: 0.3 10*3/uL (ref 0.1–0.9)
Monocytes: 6 %
NEUTROS PCT: 66 %
Neutrophils Absolute: 3.6 10*3/uL (ref 1.4–7.0)
PLATELETS: 278 10*3/uL (ref 150–379)
RBC: 5.03 x10E6/uL (ref 3.77–5.28)
RDW: 16.5 % — AB (ref 12.3–15.4)
WBC: 5.5 10*3/uL (ref 3.4–10.8)

## 2016-11-09 LAB — LIPID PANEL W/O CHOL/HDL RATIO
CHOLESTEROL TOTAL: 165 mg/dL (ref 100–199)
HDL: 56 mg/dL (ref >39)
LDL Calculated: 85 mg/dL (ref 0–99)
TRIGLYCERIDES: 119 mg/dL (ref 0–149)
VLDL Cholesterol Cal: 24 mg/dL (ref 5–40)

## 2016-11-09 LAB — TSH: TSH: 1.13 u[IU]/mL (ref 0.450–4.500)

## 2016-11-09 LAB — MICROALBUMIN, URINE WAIVED
Creatinine, Urine Waived: 100 mg/dL (ref 10–300)
Microalb, Ur Waived: 150 mg/L — ABNORMAL HIGH (ref 0–19)

## 2016-11-11 ENCOUNTER — Telehealth: Payer: Self-pay | Admitting: Family Medicine

## 2016-11-11 NOTE — Telephone Encounter (Signed)
Please let her sister or niece know that her labs came back normal. (She has dementia)

## 2016-11-11 NOTE — Telephone Encounter (Signed)
Left Message for Jasmine Arias to return my call

## 2016-11-13 NOTE — Telephone Encounter (Signed)
Sister notified Rose-Marie labs came back normal.

## 2016-11-22 ENCOUNTER — Ambulatory Visit (INDEPENDENT_AMBULATORY_CARE_PROVIDER_SITE_OTHER): Payer: Medicare Other | Admitting: Family Medicine

## 2016-11-22 ENCOUNTER — Encounter: Payer: Self-pay | Admitting: Family Medicine

## 2016-11-22 ENCOUNTER — Ambulatory Visit: Payer: Medicare Other | Admitting: Family Medicine

## 2016-11-22 VITALS — BP 168/78 | HR 64 | Temp 98.2°F | Wt 108.0 lb

## 2016-11-22 DIAGNOSIS — I10 Essential (primary) hypertension: Secondary | ICD-10-CM

## 2016-11-22 MED ORDER — LISINOPRIL 40 MG PO TABS: 40.0000 mg | ORAL_TABLET | Freq: Every day | ORAL | 0 refills | Status: AC

## 2016-11-22 NOTE — Progress Notes (Signed)
   BP (!) 168/78   Pulse 64   Temp 98.2 F (36.8 C)   Wt 108 lb (49 kg)   LMP  (LMP Unknown)   SpO2 95%   BMI 21.81 kg/m    Subjective:    Patient ID: Jasmine Arias, female    DOB: [birth year], 82 y.o.   MRN: [id number]  HPI: Jasmine Arias is a 82 y.o. female  Chief Complaint  Patient presents with  . Hypertension    follow up   Patient presents for BP f/u today. Lisinopril 20 mg was added to current regimen of metoprolol 25 mg at previous visit. Doing well with the medication, taking faithfully. Has not been checking BPs at home and daughter states she has no means to do so. Denies CP, SOB, HA, dizziness, visual changes.   Relevant past medical, surgical, family and social history reviewed and updated as indicated. Interim medical history since our last visit reviewed. Allergies and medications reviewed and updated.  Review of Systems  Constitutional: Negative.   HENT: Negative.   Eyes: Negative.   Respiratory: Negative.   Cardiovascular: Negative.   Gastrointestinal: Negative.   Genitourinary: Negative.   Musculoskeletal: Negative.   Neurological: Negative.   Psychiatric/Behavioral: Negative.     Per HPI unless specifically indicated above     Objective:    BP (!) 168/78   Pulse 64   Temp 98.2 F (36.8 C)   Wt 108 lb (49 kg)   LMP  (LMP Unknown)   SpO2 95%   BMI 21.81 kg/m   Wt Readings from Last 3 Encounters:  11/22/16 108 lb (49 kg)  11/08/16 110 lb 12.8 oz (50.3 kg)  07/05/16 109 lb (49.4 kg)    Physical Exam  Constitutional: She appears well-developed and well-nourished.  HENT:  Head: Atraumatic.  Eyes: Conjunctivae are normal. Pupils are equal, round, and reactive to light.  Neck: Normal range of motion. Neck supple.  Cardiovascular: Normal rate and normal heart sounds.   Pulmonary/Chest: Effort normal and breath sounds normal. No respiratory distress.  Musculoskeletal: Normal range of motion.  Neurological: She is alert.  Skin: Skin is  warm and dry.  Psychiatric: She has a normal mood and affect. Her behavior is normal.  Nursing note and vitals reviewed.     Assessment & Plan:   Problem List Items Addressed This Visit      Cardiovascular and Mediastinum   Essential hypertension - Primary    Not to goal, increase lisinopril to 40 mg QD. Follow up in 1 month for recheck.       Relevant Medications   lisinopril (PRINIVIL,ZESTRIL) 40 MG tablet       Follow up plan: Return in about 4 weeks (around 12/20/2016) for BP F/u.

## 2016-11-22 NOTE — Assessment & Plan Note (Signed)
Not to goal, increase lisinopril to 40 mg QD. Follow up in 1 month for recheck.

## 2016-11-22 NOTE — Patient Instructions (Signed)
Follow up in 1 month   

## 2016-11-26 ENCOUNTER — Telehealth: Payer: Self-pay | Admitting: Family Medicine

## 2016-11-26 NOTE — Telephone Encounter (Signed)
Doris dropped off paperwork for Dr Lenna Sciara to fill out.  I put in her basket.  Doris said call her or Adela Lank once completed.  Thanks  [phone]

## 2016-12-03 ENCOUNTER — Telehealth: Payer: Self-pay | Admitting: Family Medicine

## 2016-12-03 NOTE — Telephone Encounter (Signed)
Please let her sisters know that I've got her form nearly all filled out for them to pick up. I just need them to answer a few questions:   1. Is she disoriented all the time or just some times?  2. Does she wander, or is she verbally abusive or injurious to herself, others or property?  3. Does she need help with bathing, feeding and dressing?  4. Can she hear OK?  5. Can she hold her urine or is she in diapers most of the time?  6. Can she hold her bowels?  7. Are they still looking at Assisted living or are they wanting a care home? Where are they wanting her to live?  Thanks! Once I've got this stuff, her form will be ready by the end of the day.

## 2016-12-03 NOTE — Telephone Encounter (Signed)
Jasmine Arias came by office and Dr. Wynetta Emery asked needed questions, finished form and gave to sister.

## 2016-12-03 NOTE — Telephone Encounter (Signed)
Sister came in to pick up forms. Forms filled out and scanned in. See scanned document.

## 2016-12-03 NOTE — Telephone Encounter (Signed)
Form is done and faxed, sister knows.

## 2016-12-20 IMAGING — CT CT HEAD W/O CM
3 series · 17 of 30 positions shown, 19 images · non-contrast
Comparison: 12/19/2014

CLINICAL DATA: Lightheadedness and anxiety.  Onset this morning.

EXAM:
CT HEAD WITHOUT CONTRAST
TECHNIQUE: Contiguous axial images were obtained from the base of the skull
through the vertex without intravenous contrast.

[Series 2: soft tissue · axial · 0.42mm/px · z∈[-122,-16]mm · 8 of 28 slices shown]
[im 4/28  brain]
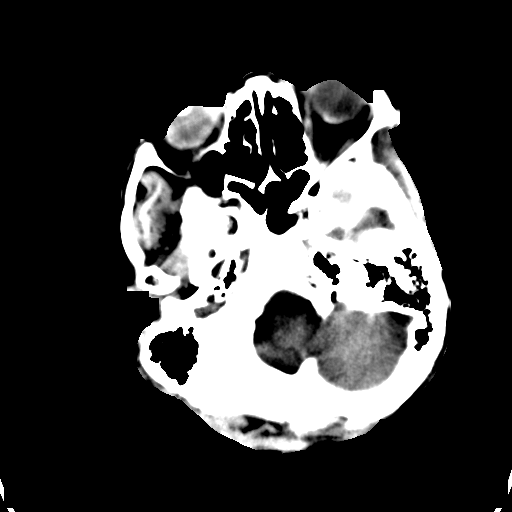
[im 7/28  brain]
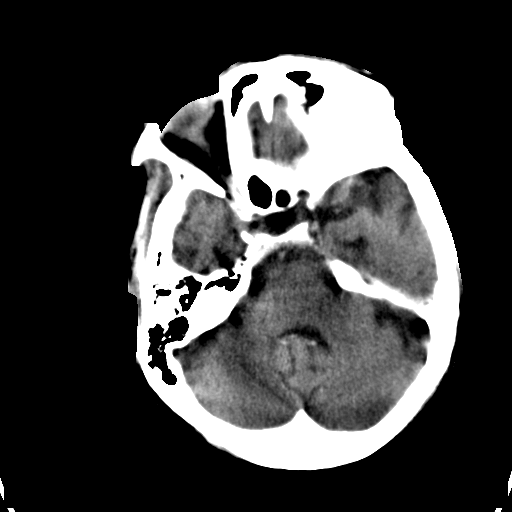
[im 10/28  brain]
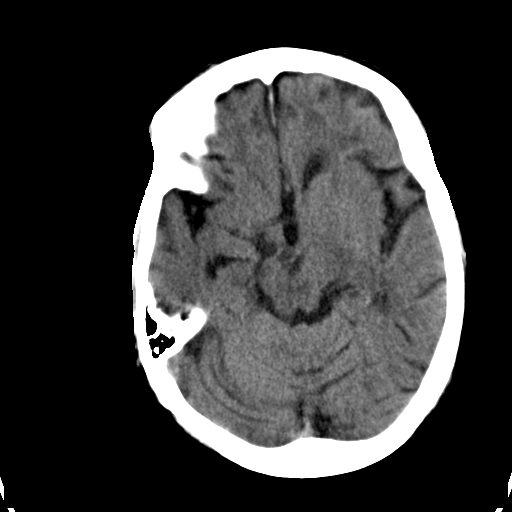
[im 13/28  brain]
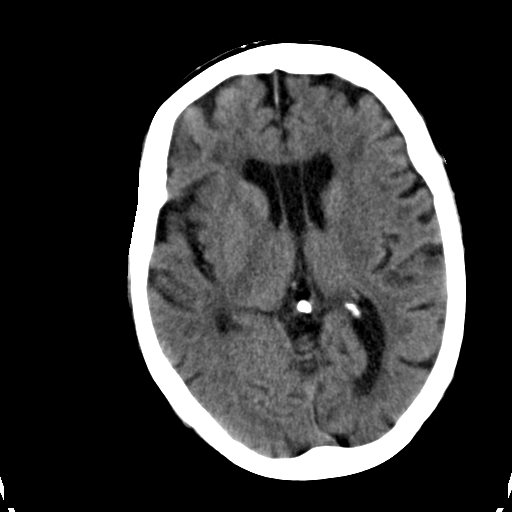
[im 16/28  brain]
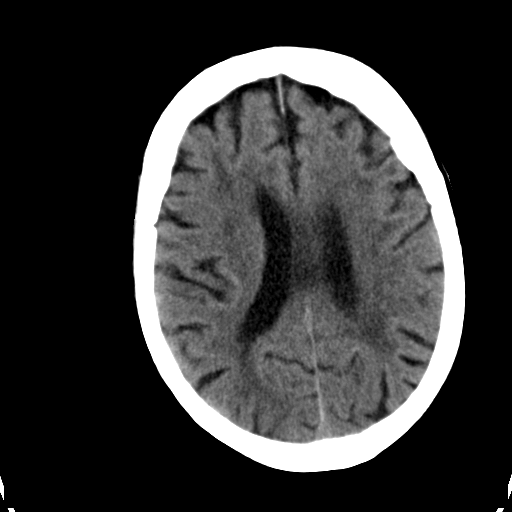
[im 19/28  brain]
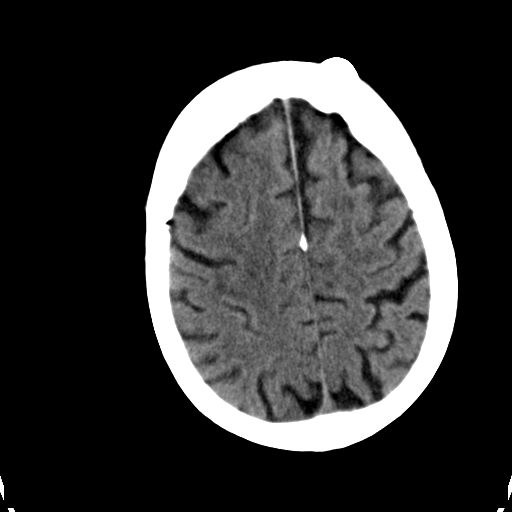
[im 22/28  brain]
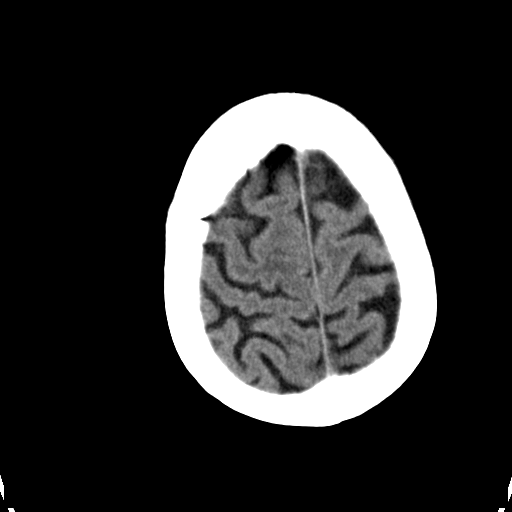
[im 25/28  brain]
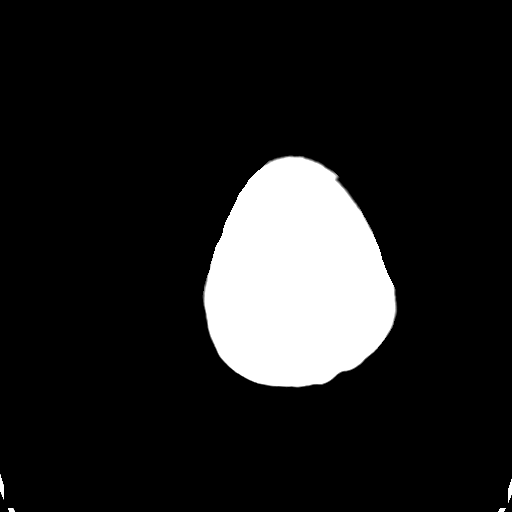

[Series 4: soft tissue-- · axial · 0.42mm/px · z∈[-76,+25]mm · 8 of 29 slices shown, 10 images]
[im 4/29  brain]
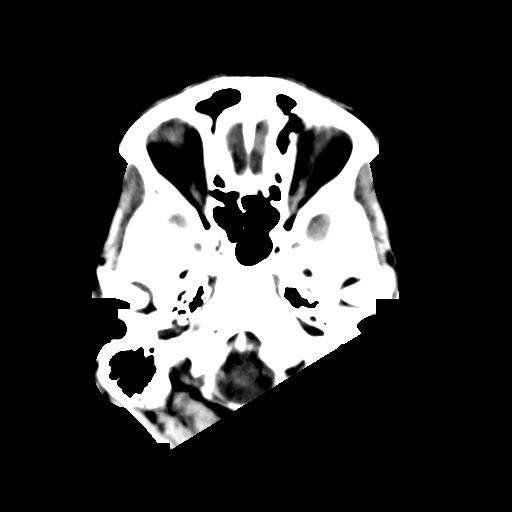
[im 4/29  bone]
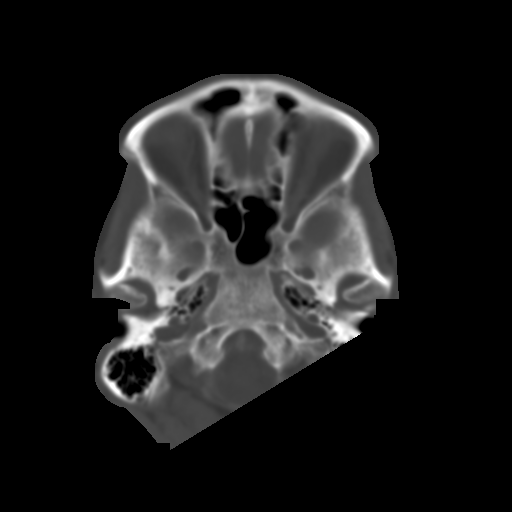
[im 7/29  brain]
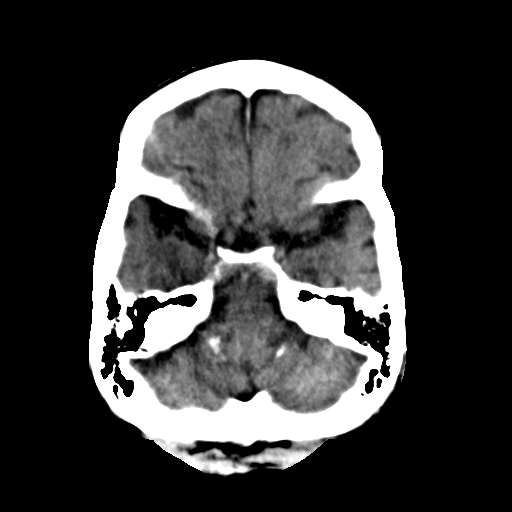
[im 10/29  brain]
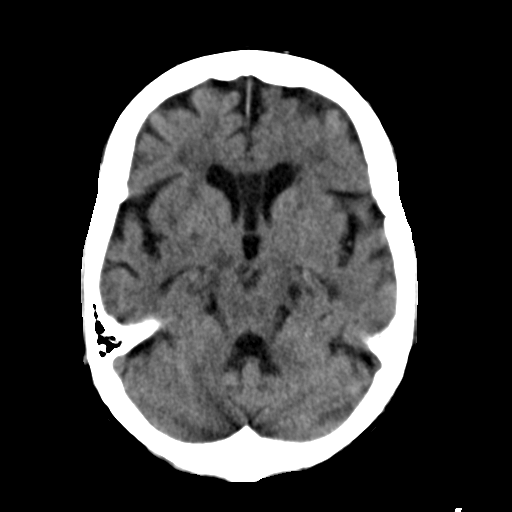
[im 13/29  brain]
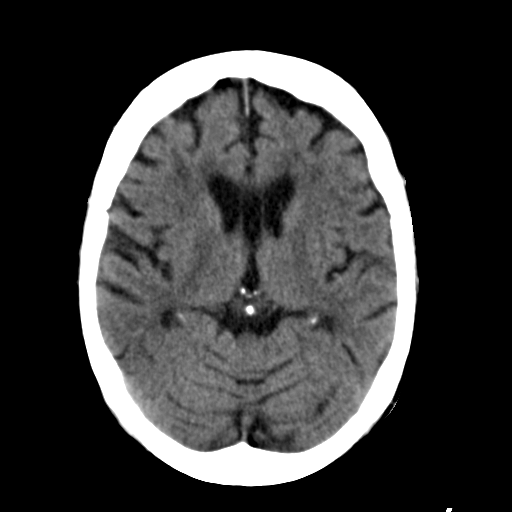
[im 16/29  brain]
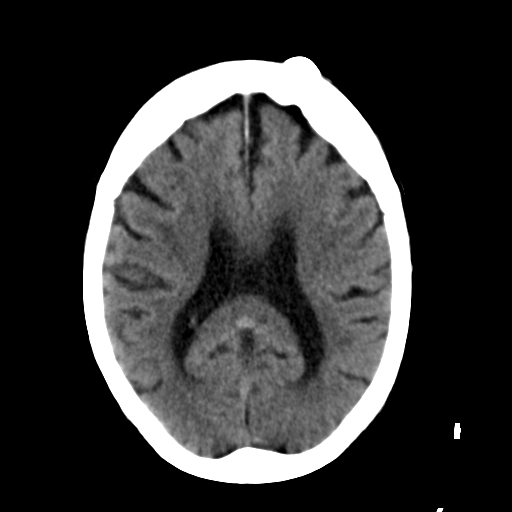
[im 16/29  bone]
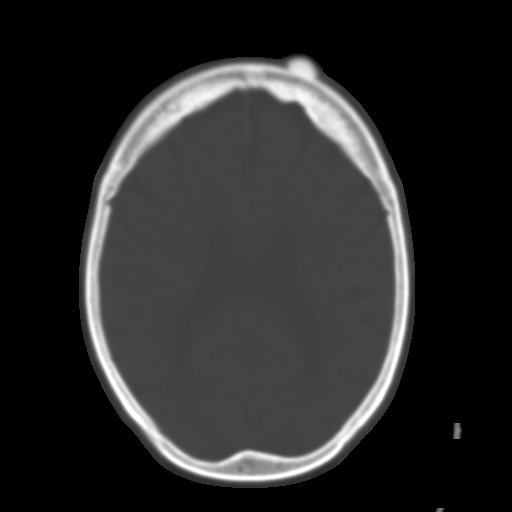
[im 19/29  brain]
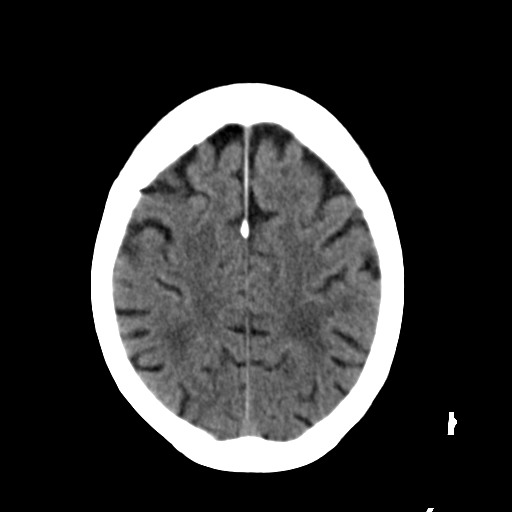
[im 22/29  brain]
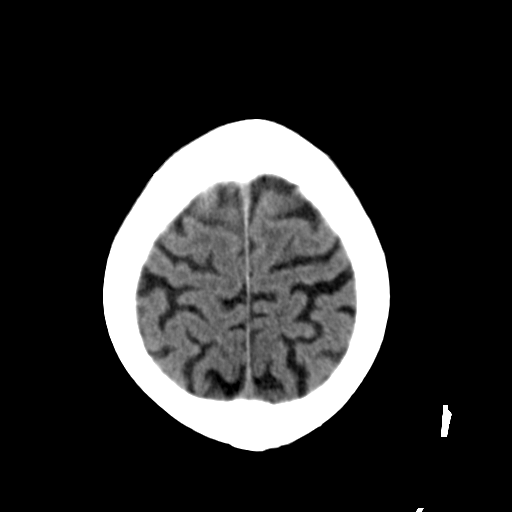
[im 25/29  brain]
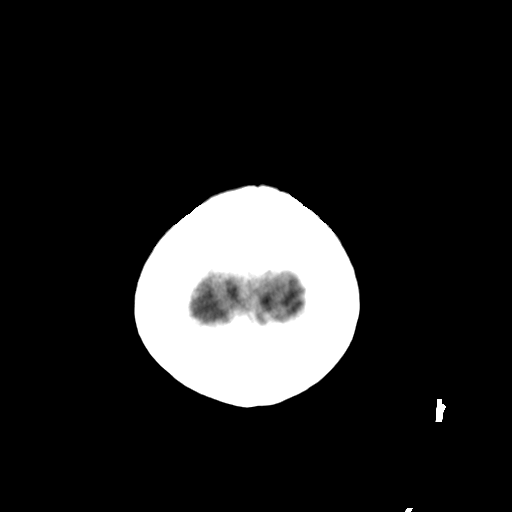

[Series 5: bone-- · axial · 0.42mm/px · 1 of 28 slices shown]
[im 4/28  bone]
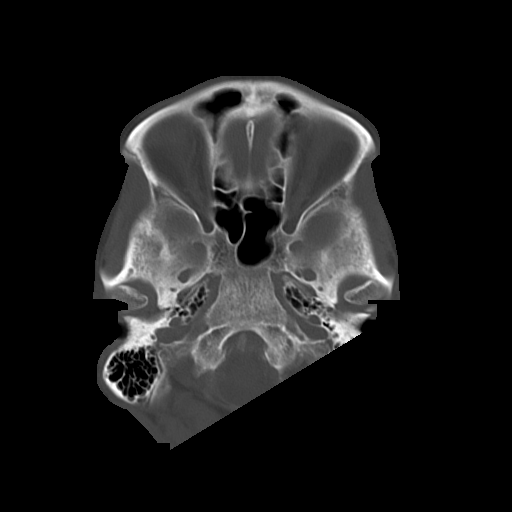

[17 of 30 positions shown; findings below may reference images not displayed]

FINDINGS: There is no intracranial hemorrhage, mass or evidence of acute
infarction. There is moderate generalized atrophy. There is white
matter hypodensity consistent with small vessel ischemic disease.
Remote lacunar infarctions are suggested in the frontal
periventricular white matter. No acute findings are evident. No
interval change is evident. Visible paranasal sinuses are clear.
Left frontal osteoma incidentally noted. No significant bone lesions
are evident.
IMPRESSION: No acute findings. Moderate generalized atrophy and chronic small
vessel disease.

## 2016-12-23 ENCOUNTER — Ambulatory Visit: Payer: Medicare Other | Admitting: Family Medicine

## 2016-12-24 ENCOUNTER — Telehealth: Payer: Self-pay | Admitting: Family Medicine

## 2016-12-24 NOTE — Telephone Encounter (Signed)
Paperwork dropped off put in Dr [employer]

## 2016-12-26 DIAGNOSIS — I10 Essential (primary) hypertension: Secondary | ICD-10-CM | POA: Diagnosis not present

## 2016-12-26 DIAGNOSIS — G309 Alzheimer's disease, unspecified: Secondary | ICD-10-CM | POA: Diagnosis not present

## 2016-12-26 DIAGNOSIS — E119 Type 2 diabetes mellitus without complications: Secondary | ICD-10-CM | POA: Diagnosis not present

## 2016-12-26 DIAGNOSIS — R2689 Other abnormalities of gait and mobility: Secondary | ICD-10-CM | POA: Diagnosis not present

## 2016-12-26 DIAGNOSIS — Z8673 Personal history of transient ischemic attack (TIA), and cerebral infarction without residual deficits: Secondary | ICD-10-CM | POA: Diagnosis not present

## 2016-12-26 DIAGNOSIS — J449 Chronic obstructive pulmonary disease, unspecified: Secondary | ICD-10-CM | POA: Diagnosis not present

## 2016-12-26 DIAGNOSIS — E785 Hyperlipidemia, unspecified: Secondary | ICD-10-CM | POA: Diagnosis not present

## 2016-12-26 NOTE — Telephone Encounter (Signed)
More paperwork dropped for Dr Wynetta Emery to fill out and call when completed.

## 2016-12-27 NOTE — Telephone Encounter (Signed)
Called to let them know some of Patients paperwork is ready to pick up at the front desk.  Left a message for the office to call me back

## 2016-12-28 DIAGNOSIS — I10 Essential (primary) hypertension: Secondary | ICD-10-CM | POA: Diagnosis not present

## 2016-12-28 DIAGNOSIS — Z8673 Personal history of transient ischemic attack (TIA), and cerebral infarction without residual deficits: Secondary | ICD-10-CM | POA: Diagnosis not present

## 2016-12-28 DIAGNOSIS — J449 Chronic obstructive pulmonary disease, unspecified: Secondary | ICD-10-CM | POA: Diagnosis not present

## 2016-12-28 DIAGNOSIS — G309 Alzheimer's disease, unspecified: Secondary | ICD-10-CM | POA: Diagnosis not present

## 2016-12-28 DIAGNOSIS — R2689 Other abnormalities of gait and mobility: Secondary | ICD-10-CM | POA: Diagnosis not present

## 2016-12-28 DIAGNOSIS — E119 Type 2 diabetes mellitus without complications: Secondary | ICD-10-CM | POA: Diagnosis not present

## 2016-12-28 DIAGNOSIS — E785 Hyperlipidemia, unspecified: Secondary | ICD-10-CM | POA: Diagnosis not present

## 2016-12-30 ENCOUNTER — Telehealth: Payer: Self-pay | Admitting: Family Medicine

## 2016-12-30 MED ORDER — ACETAMINOPHEN 325 MG PO TABS: 325.0000 mg | ORAL_TABLET | ORAL | 3 refills | Status: DC | PRN

## 2016-12-30 NOTE — Telephone Encounter (Signed)
Forms for nursing home dropped off. Forms filled out and signed. Please see scanned document. OK for them to pick them up.

## 2016-12-31 DIAGNOSIS — J449 Chronic obstructive pulmonary disease, unspecified: Secondary | ICD-10-CM | POA: Diagnosis not present

## 2016-12-31 DIAGNOSIS — E785 Hyperlipidemia, unspecified: Secondary | ICD-10-CM | POA: Diagnosis not present

## 2016-12-31 DIAGNOSIS — Z8673 Personal history of transient ischemic attack (TIA), and cerebral infarction without residual deficits: Secondary | ICD-10-CM | POA: Diagnosis not present

## 2016-12-31 DIAGNOSIS — I10 Essential (primary) hypertension: Secondary | ICD-10-CM | POA: Diagnosis not present

## 2016-12-31 DIAGNOSIS — E119 Type 2 diabetes mellitus without complications: Secondary | ICD-10-CM | POA: Diagnosis not present

## 2016-12-31 DIAGNOSIS — G309 Alzheimer's disease, unspecified: Secondary | ICD-10-CM | POA: Diagnosis not present

## 2016-12-31 DIAGNOSIS — R2689 Other abnormalities of gait and mobility: Secondary | ICD-10-CM | POA: Diagnosis not present

## 2016-12-31 NOTE — Telephone Encounter (Signed)
Paperwork was picked up on 12/30/2016

## 2017-01-01 DIAGNOSIS — E785 Hyperlipidemia, unspecified: Secondary | ICD-10-CM | POA: Diagnosis not present

## 2017-01-01 DIAGNOSIS — Z8673 Personal history of transient ischemic attack (TIA), and cerebral infarction without residual deficits: Secondary | ICD-10-CM | POA: Diagnosis not present

## 2017-01-01 DIAGNOSIS — R2689 Other abnormalities of gait and mobility: Secondary | ICD-10-CM | POA: Diagnosis not present

## 2017-01-01 DIAGNOSIS — J449 Chronic obstructive pulmonary disease, unspecified: Secondary | ICD-10-CM | POA: Diagnosis not present

## 2017-01-01 DIAGNOSIS — I10 Essential (primary) hypertension: Secondary | ICD-10-CM | POA: Diagnosis not present

## 2017-01-01 DIAGNOSIS — E119 Type 2 diabetes mellitus without complications: Secondary | ICD-10-CM | POA: Diagnosis not present

## 2017-01-01 DIAGNOSIS — G309 Alzheimer's disease, unspecified: Secondary | ICD-10-CM | POA: Diagnosis not present

## 2017-01-02 DIAGNOSIS — E119 Type 2 diabetes mellitus without complications: Secondary | ICD-10-CM | POA: Diagnosis not present

## 2017-01-02 DIAGNOSIS — J449 Chronic obstructive pulmonary disease, unspecified: Secondary | ICD-10-CM | POA: Diagnosis not present

## 2017-01-02 DIAGNOSIS — Z8673 Personal history of transient ischemic attack (TIA), and cerebral infarction without residual deficits: Secondary | ICD-10-CM | POA: Diagnosis not present

## 2017-01-02 DIAGNOSIS — R2689 Other abnormalities of gait and mobility: Secondary | ICD-10-CM | POA: Diagnosis not present

## 2017-01-02 DIAGNOSIS — G309 Alzheimer's disease, unspecified: Secondary | ICD-10-CM | POA: Diagnosis not present

## 2017-01-02 DIAGNOSIS — I10 Essential (primary) hypertension: Secondary | ICD-10-CM | POA: Diagnosis not present

## 2017-01-02 DIAGNOSIS — E785 Hyperlipidemia, unspecified: Secondary | ICD-10-CM | POA: Diagnosis not present

## 2017-01-03 DIAGNOSIS — J449 Chronic obstructive pulmonary disease, unspecified: Secondary | ICD-10-CM | POA: Diagnosis not present

## 2017-01-03 DIAGNOSIS — G309 Alzheimer's disease, unspecified: Secondary | ICD-10-CM | POA: Diagnosis not present

## 2017-01-03 DIAGNOSIS — I10 Essential (primary) hypertension: Secondary | ICD-10-CM | POA: Diagnosis not present

## 2017-01-03 DIAGNOSIS — E119 Type 2 diabetes mellitus without complications: Secondary | ICD-10-CM | POA: Diagnosis not present

## 2017-01-03 DIAGNOSIS — R2689 Other abnormalities of gait and mobility: Secondary | ICD-10-CM | POA: Diagnosis not present

## 2017-01-03 DIAGNOSIS — Z8673 Personal history of transient ischemic attack (TIA), and cerebral infarction without residual deficits: Secondary | ICD-10-CM | POA: Diagnosis not present

## 2017-01-03 DIAGNOSIS — E785 Hyperlipidemia, unspecified: Secondary | ICD-10-CM | POA: Diagnosis not present

## 2017-01-06 ENCOUNTER — Ambulatory Visit: Payer: Medicare Other | Admitting: Family Medicine

## 2017-01-06 DIAGNOSIS — G309 Alzheimer's disease, unspecified: Secondary | ICD-10-CM | POA: Diagnosis not present

## 2017-01-06 DIAGNOSIS — R2689 Other abnormalities of gait and mobility: Secondary | ICD-10-CM | POA: Diagnosis not present

## 2017-01-06 DIAGNOSIS — E785 Hyperlipidemia, unspecified: Secondary | ICD-10-CM | POA: Diagnosis not present

## 2017-01-06 DIAGNOSIS — Z8673 Personal history of transient ischemic attack (TIA), and cerebral infarction without residual deficits: Secondary | ICD-10-CM | POA: Diagnosis not present

## 2017-01-06 DIAGNOSIS — E119 Type 2 diabetes mellitus without complications: Secondary | ICD-10-CM | POA: Diagnosis not present

## 2017-01-06 DIAGNOSIS — J449 Chronic obstructive pulmonary disease, unspecified: Secondary | ICD-10-CM | POA: Diagnosis not present

## 2017-01-06 DIAGNOSIS — I10 Essential (primary) hypertension: Secondary | ICD-10-CM | POA: Diagnosis not present

## 2017-01-06 NOTE — Progress Notes (Deleted)
LMP  (LMP Unknown)    Subjective:    Patient ID: Jasmine Arias, female    DOB: 1934/11/04, 82 y.o.   MRN: [id number]  HPI: Jasmine Arias is a 82 y.o. female  No chief complaint on file.  HYPERTENSION Hypertension status: {Blank single:19197::"controlled","uncontrolled","better","worse","exacerbated","stable"}  Satisfied with current treatment? {Blank single:19197::"yes","no"} Duration of hypertension: {Blank single:19197::"chronic","months","years"} BP monitoring frequency:  {Blank single:19197::"not checking","rarely","daily","weekly","monthly","a few times a day","a few times a week","a few times a month"} BP range:  BP medication side effects:  {Blank single:19197::"yes","no"} Medication compliance: {Blank single:19197::"excellent compliance","good compliance","fair compliance","poor compliance"} Previous BP meds:{Blank KDTOIZTI:45809::"XIPJ","ASNKNLZJQB","HALPFXTKWI/OXBDZHGDJM","EQASTMHD","QQIWLNLGXQ","JJHERDEYCX/KGYJ","EHUDJSHFWY (bystolic)","carvedilol","chlorthalidone","clonidine","diltiazem","exforge HCT","HCTZ","irbesartan (avapro)","labetalol","lisinopril","lisinopril-HCTZ","losartan (cozaar)","methyldopa","nifedipine","olmesartan (benicar)","olmesartan-HCTZ","quinapril","ramipril","spironalactone","tekturna","valsartan","valsartan-HCTZ","verapamil"} Aspirin: {Blank single:19197::"yes","no"} Recurrent headaches: {Blank single:19197::"yes","no"} Visual changes: {Blank single:19197::"yes","no"} Palpitations: {Blank single:19197::"yes","no"} Dyspnea: {Blank single:19197::"yes","no"} Chest pain: {Blank single:19197::"yes","no"} Lower extremity edema: {Blank single:19197::"yes","no"} Dizzy/lightheaded: {Blank single:19197::"yes","no"}   Relevant past medical, surgical, family and social history reviewed and updated as indicated. Interim medical history since our last visit reviewed. Allergies and medications reviewed and updated.  Review of Systems  Per HPI unless  specifically indicated above     Objective:    LMP  (LMP Unknown)   Wt Readings from Last 3 Encounters:  11/22/16 108 lb (49 kg)  11/08/16 110 lb 12.8 oz (50.3 kg)  07/05/16 109 lb (49.4 kg)    Physical Exam  Results for orders placed or performed in visit on 11/08/16  Bayer DCA Hb A1c Waived  Result Value Ref Range   Bayer DCA Hb A1c Waived 7.0 (H) <7.0 %  CBC with Differential/Platelet  Result Value Ref Range   WBC 5.5 3.4 - 10.8 x10E3/uL   RBC 5.03 3.77 - 5.28 x10E6/uL   Hemoglobin 13.5 11.1 - 15.9 g/dL   Hematocrit 42.3 34.0 - 46.6 %   MCV 84 79 - 97 fL   MCH 26.8 26.6 - 33.0 pg   MCHC 31.9 31.5 - 35.7 g/dL   RDW 16.5 (H) 12.3 - 15.4 %   Platelets 278 150 - 379 x10E3/uL   Neutrophils 66 Not Estab. %   Lymphs 27 Not Estab. %   Monocytes 6 Not Estab. %   Eos 1 Not Estab. %   Basos 0 Not Estab. %   Neutrophils Absolute 3.6 1.4 - 7.0 x10E3/uL   Lymphocytes Absolute 1.5 0.7 - 3.1 x10E3/uL   Monocytes Absolute 0.3 0.1 - 0.9 x10E3/uL   EOS (ABSOLUTE) 0.1 0.0 - 0.4 x10E3/uL   Basophils Absolute 0.0 0.0 - 0.2 x10E3/uL   Immature Granulocytes 0 Not Estab. %   Immature Grans (Abs) 0.0 0.0 - 0.1 x10E3/uL  Comprehensive metabolic panel  Result Value Ref Range   Glucose 105 (H) 65 - 99 mg/dL   BUN 8 8 - 27 mg/dL   Creatinine, Ser 0.97 0.57 - 1.00 mg/dL   GFR calc non Af Amer 56 (L) >59 mL/min/1.73   GFR calc Af Amer 64 >59 mL/min/1.73   BUN/Creatinine Ratio 8 (L) 12 - 28   Sodium 144 134 - 144 mmol/L   Potassium 3.7 3.5 - 5.2 mmol/L   Chloride 101 96 - 106 mmol/L   CO2 26 18 - 29 mmol/L   Calcium 9.3 8.7 - 10.3 mg/dL   Total Protein 6.8 6.0 - 8.5 g/dL   Albumin 3.8 3.5 - 4.8 g/dL   Globulin, Total 3.0 1.5 - 4.5 g/dL   Albumin/Globulin Ratio 1.3 1.2 - 2.2   Bilirubin Total 0.2 0.0 - 1.2 mg/dL   Alkaline Phosphatase 117 39 - 117 IU/L   AST 13 0 - 40  IU/L   ALT 5 0 - 32 IU/L  Lipid Panel w/o Chol/HDL Ratio  Result Value Ref Range   Cholesterol, Total 165 100 - 199  mg/dL   Triglycerides 119 0 - 149 mg/dL   HDL 56 >39 mg/dL   VLDL Cholesterol Cal 24 5 - 40 mg/dL   LDL Calculated 85 0 - 99 mg/dL  Microalbumin, Urine Waived  Result Value Ref Range   Microalb, Ur Waived 150 (H) 0 - 19 mg/L   Creatinine, Urine Waived 100 10 - 300 mg/dL   Microalb/Creat Ratio 30-300 (H) <30 mg/g  TSH  Result Value Ref Range   TSH 1.130 0.450 - 4.500 uIU/mL  UA/M w/rflx Culture, Routine  Result Value Ref Range   Specific Gravity, UA 1.015 1.005 - 1.030   pH, UA 7.0 5.0 - 7.5   Color, UA Yellow Yellow   Appearance Ur Clear Clear   Leukocytes, UA Negative Negative   Protein, UA Trace (A) Negative/Trace   Glucose, UA Negative Negative   Ketones, UA Negative Negative   RBC, UA Negative Negative   Bilirubin, UA Negative Negative   Urobilinogen, Ur 0.2 0.2 - 1.0 mg/dL   Nitrite, UA Negative Negative  B12 and Folate Panel  Result Value Ref Range   Vitamin B-12 386 232 - 1,245 pg/mL   Folate 10.0 >3.0 ng/mL      Assessment & Plan:   Problem List Items Addressed This Visit    None       Follow up plan: No Follow-up on file.

## 2017-01-07 DIAGNOSIS — G309 Alzheimer's disease, unspecified: Secondary | ICD-10-CM | POA: Diagnosis not present

## 2017-01-07 DIAGNOSIS — J449 Chronic obstructive pulmonary disease, unspecified: Secondary | ICD-10-CM | POA: Diagnosis not present

## 2017-01-07 DIAGNOSIS — Z8673 Personal history of transient ischemic attack (TIA), and cerebral infarction without residual deficits: Secondary | ICD-10-CM | POA: Diagnosis not present

## 2017-01-07 DIAGNOSIS — I10 Essential (primary) hypertension: Secondary | ICD-10-CM | POA: Diagnosis not present

## 2017-01-07 DIAGNOSIS — E785 Hyperlipidemia, unspecified: Secondary | ICD-10-CM | POA: Diagnosis not present

## 2017-01-07 DIAGNOSIS — E119 Type 2 diabetes mellitus without complications: Secondary | ICD-10-CM | POA: Diagnosis not present

## 2017-01-07 DIAGNOSIS — R2689 Other abnormalities of gait and mobility: Secondary | ICD-10-CM | POA: Diagnosis not present

## 2017-01-14 DIAGNOSIS — R2689 Other abnormalities of gait and mobility: Secondary | ICD-10-CM | POA: Diagnosis not present

## 2017-01-14 DIAGNOSIS — E785 Hyperlipidemia, unspecified: Secondary | ICD-10-CM | POA: Diagnosis not present

## 2017-01-14 DIAGNOSIS — E119 Type 2 diabetes mellitus without complications: Secondary | ICD-10-CM | POA: Diagnosis not present

## 2017-01-14 DIAGNOSIS — I10 Essential (primary) hypertension: Secondary | ICD-10-CM | POA: Diagnosis not present

## 2017-01-14 DIAGNOSIS — Z8673 Personal history of transient ischemic attack (TIA), and cerebral infarction without residual deficits: Secondary | ICD-10-CM | POA: Diagnosis not present

## 2017-01-14 DIAGNOSIS — G309 Alzheimer's disease, unspecified: Secondary | ICD-10-CM | POA: Diagnosis not present

## 2017-01-14 DIAGNOSIS — J449 Chronic obstructive pulmonary disease, unspecified: Secondary | ICD-10-CM | POA: Diagnosis not present

## 2017-01-16 DIAGNOSIS — J449 Chronic obstructive pulmonary disease, unspecified: Secondary | ICD-10-CM | POA: Diagnosis not present

## 2017-01-16 DIAGNOSIS — E785 Hyperlipidemia, unspecified: Secondary | ICD-10-CM | POA: Diagnosis not present

## 2017-01-16 DIAGNOSIS — G309 Alzheimer's disease, unspecified: Secondary | ICD-10-CM | POA: Diagnosis not present

## 2017-01-16 DIAGNOSIS — E119 Type 2 diabetes mellitus without complications: Secondary | ICD-10-CM | POA: Diagnosis not present

## 2017-01-16 DIAGNOSIS — I10 Essential (primary) hypertension: Secondary | ICD-10-CM | POA: Diagnosis not present

## 2017-01-16 DIAGNOSIS — R2689 Other abnormalities of gait and mobility: Secondary | ICD-10-CM | POA: Diagnosis not present

## 2017-01-16 DIAGNOSIS — Z8673 Personal history of transient ischemic attack (TIA), and cerebral infarction without residual deficits: Secondary | ICD-10-CM | POA: Diagnosis not present

## 2017-01-17 DIAGNOSIS — I10 Essential (primary) hypertension: Secondary | ICD-10-CM | POA: Diagnosis not present

## 2017-01-17 DIAGNOSIS — Z8673 Personal history of transient ischemic attack (TIA), and cerebral infarction without residual deficits: Secondary | ICD-10-CM | POA: Diagnosis not present

## 2017-01-17 DIAGNOSIS — R2689 Other abnormalities of gait and mobility: Secondary | ICD-10-CM | POA: Diagnosis not present

## 2017-01-17 DIAGNOSIS — J449 Chronic obstructive pulmonary disease, unspecified: Secondary | ICD-10-CM | POA: Diagnosis not present

## 2017-01-17 DIAGNOSIS — G309 Alzheimer's disease, unspecified: Secondary | ICD-10-CM | POA: Diagnosis not present

## 2017-01-17 DIAGNOSIS — E785 Hyperlipidemia, unspecified: Secondary | ICD-10-CM | POA: Diagnosis not present

## 2017-01-17 DIAGNOSIS — E119 Type 2 diabetes mellitus without complications: Secondary | ICD-10-CM | POA: Diagnosis not present

## 2017-01-20 DIAGNOSIS — Z8673 Personal history of transient ischemic attack (TIA), and cerebral infarction without residual deficits: Secondary | ICD-10-CM | POA: Diagnosis not present

## 2017-01-20 DIAGNOSIS — I10 Essential (primary) hypertension: Secondary | ICD-10-CM | POA: Diagnosis not present

## 2017-01-20 DIAGNOSIS — J449 Chronic obstructive pulmonary disease, unspecified: Secondary | ICD-10-CM | POA: Diagnosis not present

## 2017-01-20 DIAGNOSIS — E785 Hyperlipidemia, unspecified: Secondary | ICD-10-CM | POA: Diagnosis not present

## 2017-01-20 DIAGNOSIS — E119 Type 2 diabetes mellitus without complications: Secondary | ICD-10-CM | POA: Diagnosis not present

## 2017-01-20 DIAGNOSIS — R2689 Other abnormalities of gait and mobility: Secondary | ICD-10-CM | POA: Diagnosis not present

## 2017-01-20 DIAGNOSIS — G309 Alzheimer's disease, unspecified: Secondary | ICD-10-CM | POA: Diagnosis not present

## 2017-01-21 DIAGNOSIS — J449 Chronic obstructive pulmonary disease, unspecified: Secondary | ICD-10-CM | POA: Diagnosis not present

## 2017-01-21 DIAGNOSIS — Z8673 Personal history of transient ischemic attack (TIA), and cerebral infarction without residual deficits: Secondary | ICD-10-CM | POA: Diagnosis not present

## 2017-01-21 DIAGNOSIS — G309 Alzheimer's disease, unspecified: Secondary | ICD-10-CM | POA: Diagnosis not present

## 2017-01-21 DIAGNOSIS — E119 Type 2 diabetes mellitus without complications: Secondary | ICD-10-CM | POA: Diagnosis not present

## 2017-01-21 DIAGNOSIS — E785 Hyperlipidemia, unspecified: Secondary | ICD-10-CM | POA: Diagnosis not present

## 2017-01-21 DIAGNOSIS — I10 Essential (primary) hypertension: Secondary | ICD-10-CM | POA: Diagnosis not present

## 2017-01-21 DIAGNOSIS — R2689 Other abnormalities of gait and mobility: Secondary | ICD-10-CM | POA: Diagnosis not present

## 2017-01-22 DIAGNOSIS — J449 Chronic obstructive pulmonary disease, unspecified: Secondary | ICD-10-CM | POA: Diagnosis not present

## 2017-01-22 DIAGNOSIS — E119 Type 2 diabetes mellitus without complications: Secondary | ICD-10-CM | POA: Diagnosis not present

## 2017-01-22 DIAGNOSIS — G309 Alzheimer's disease, unspecified: Secondary | ICD-10-CM | POA: Diagnosis not present

## 2017-01-22 DIAGNOSIS — R2689 Other abnormalities of gait and mobility: Secondary | ICD-10-CM | POA: Diagnosis not present

## 2017-01-22 DIAGNOSIS — Z8673 Personal history of transient ischemic attack (TIA), and cerebral infarction without residual deficits: Secondary | ICD-10-CM | POA: Diagnosis not present

## 2017-01-22 DIAGNOSIS — I10 Essential (primary) hypertension: Secondary | ICD-10-CM | POA: Diagnosis not present

## 2017-01-22 DIAGNOSIS — E785 Hyperlipidemia, unspecified: Secondary | ICD-10-CM | POA: Diagnosis not present

## 2017-01-26 IMAGING — CR DG CHEST 2V
2 series · 2 of 2 positions shown · non-contrast
Comparison: 06/12/2015 and earlier.

CLINICAL DATA: 80-year-old who was recently in a rehabilitation
facility after a stroke, recent left carotid endarterectomy
08/11/2015, presenting with recent generalized weakness and an acute
near syncopal episode while at the physician's office earlier today.

EXAM:
CHEST  2 VIEW

[chest pa]
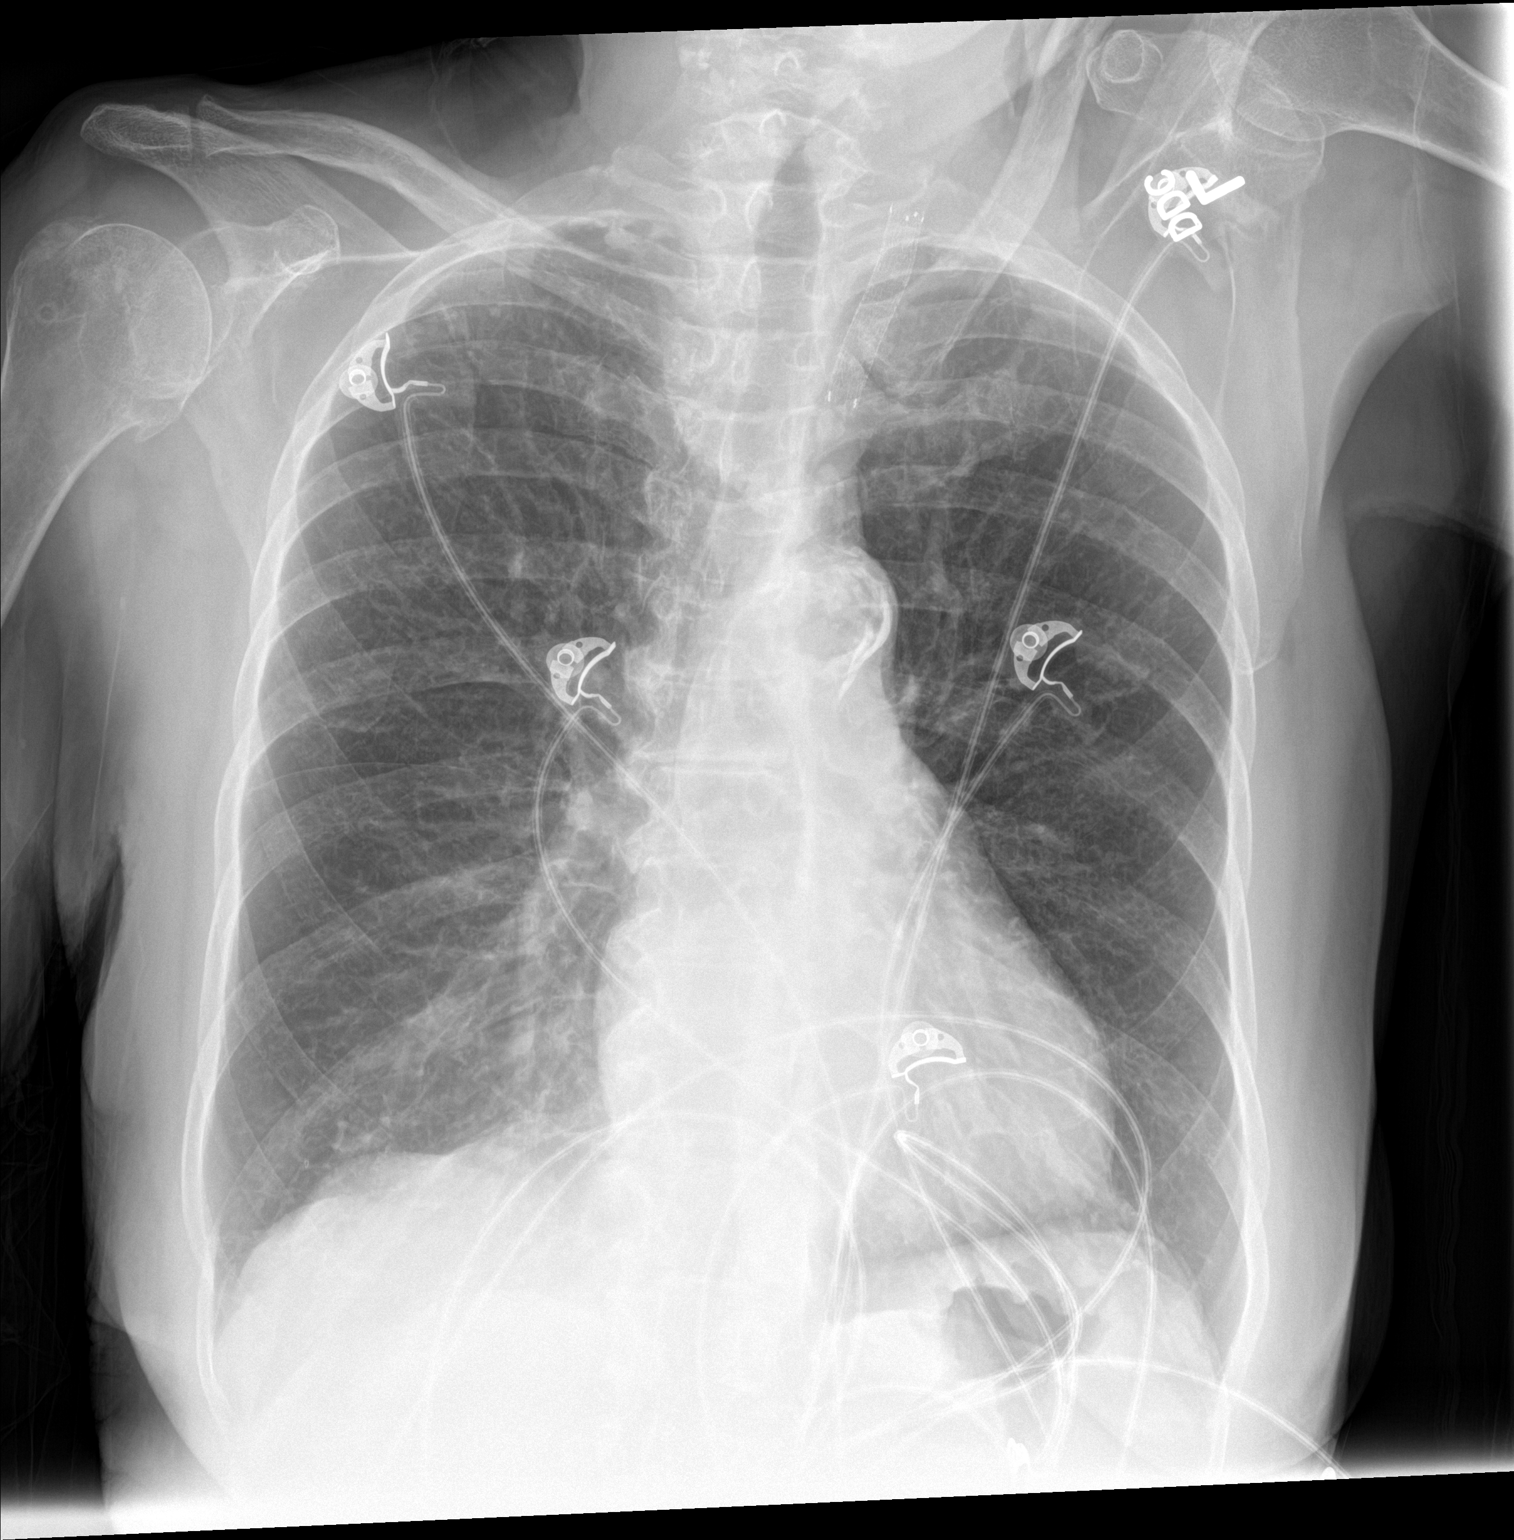

[chest lat]
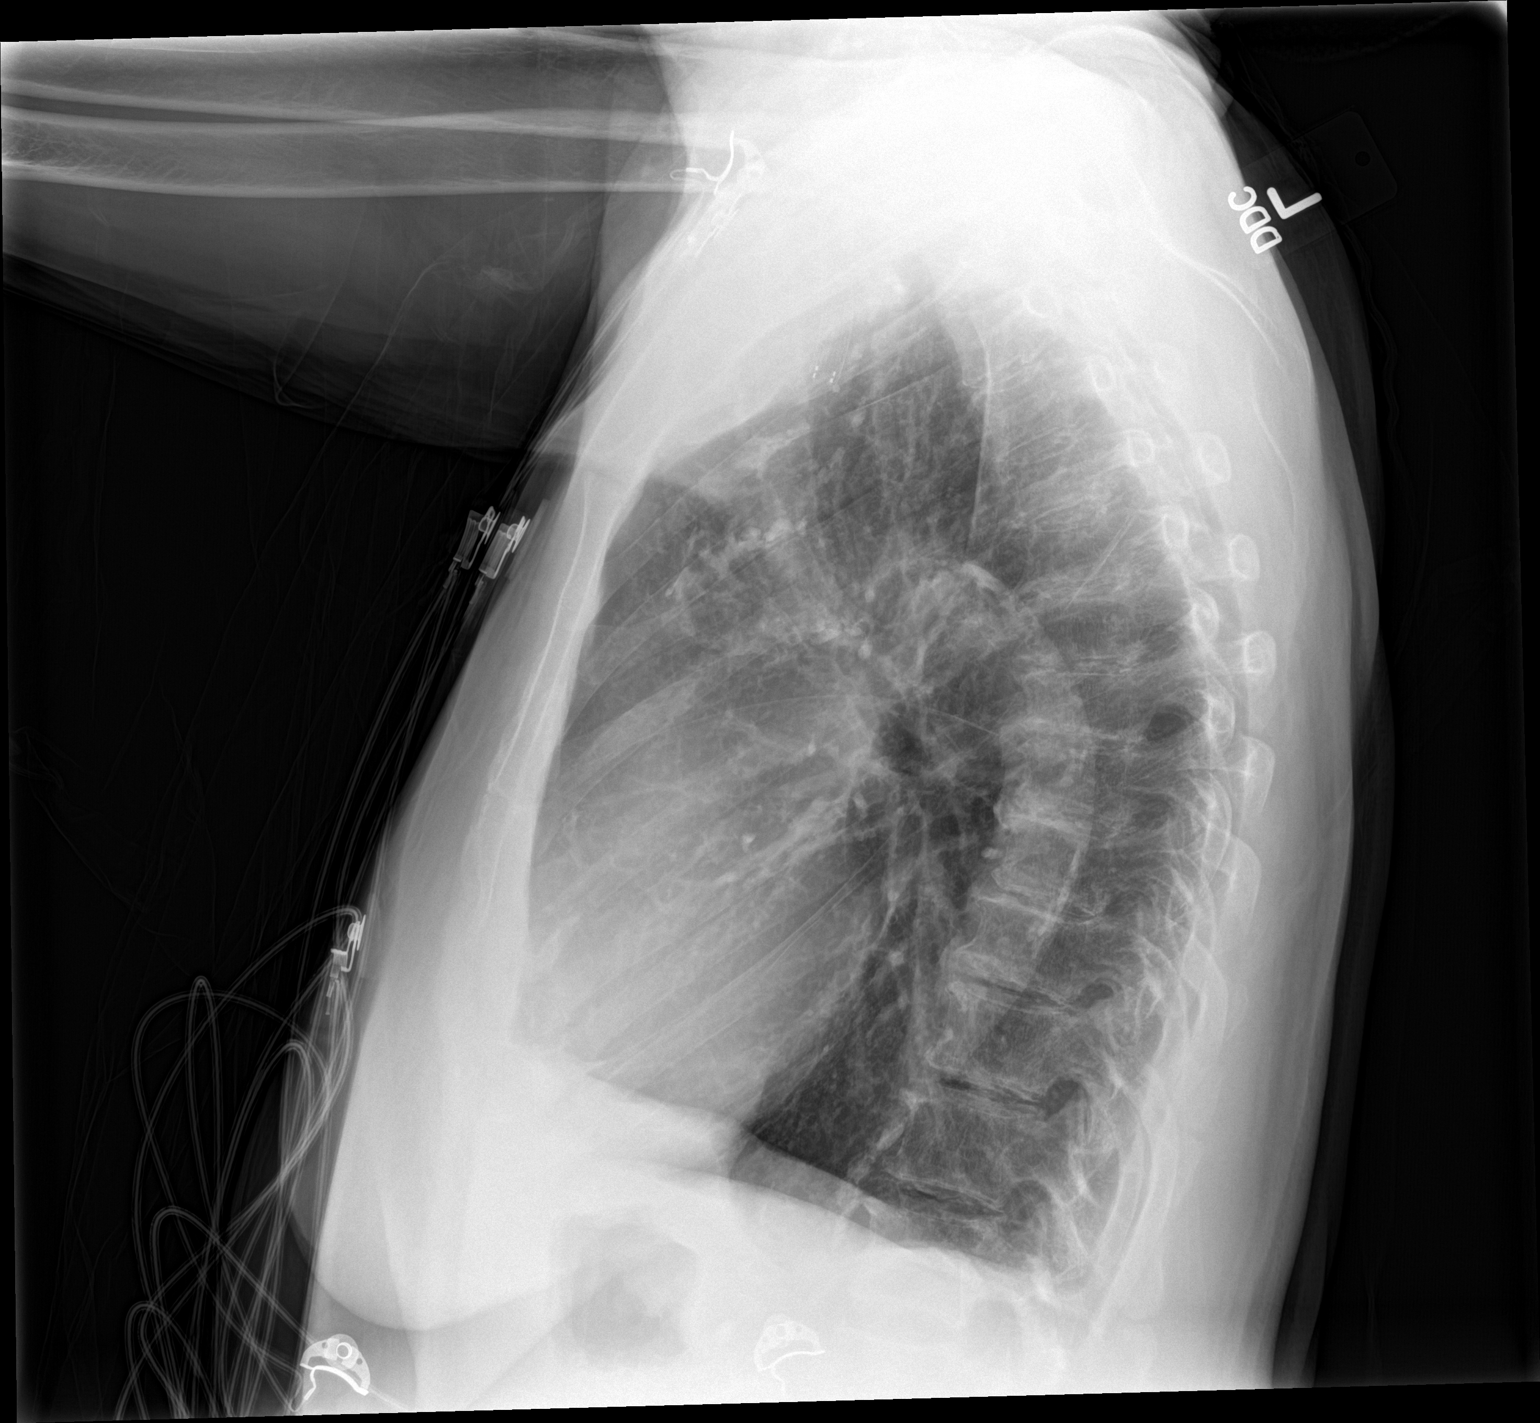

[2 of 2 positions shown; findings below may reference images not displayed]

FINDINGS: Cardiac silhouette mildly enlarged, unchanged. Thoracic aorta
atherosclerotic, unchanged. Hilar and mediastinal contours otherwise
unremarkable. Hyperinflation and emphysematous changes throughout
both lungs, unchanged. Mildly prominent bronchovascular markings
diffusely and mild central peribronchial thickening, unchanged.
Lungs otherwise clear. No localized airspace consolidation. No
pleural effusions. No pneumothorax. Normal pulmonary vascularity.
Degenerative changes throughout the thoracic spine. Proximal left
common carotid artery stent. Atherosclerotic calcification of the
great vessels projects over the lung apices.
IMPRESSION: COPD/emphysema. No acute cardiopulmonary disease. Stable mild
cardiomegaly.

## 2017-01-29 DIAGNOSIS — R2689 Other abnormalities of gait and mobility: Secondary | ICD-10-CM | POA: Diagnosis not present

## 2017-01-29 DIAGNOSIS — G309 Alzheimer's disease, unspecified: Secondary | ICD-10-CM | POA: Diagnosis not present

## 2017-01-29 DIAGNOSIS — E119 Type 2 diabetes mellitus without complications: Secondary | ICD-10-CM | POA: Diagnosis not present

## 2017-01-29 DIAGNOSIS — E785 Hyperlipidemia, unspecified: Secondary | ICD-10-CM | POA: Diagnosis not present

## 2017-01-29 DIAGNOSIS — J449 Chronic obstructive pulmonary disease, unspecified: Secondary | ICD-10-CM | POA: Diagnosis not present

## 2017-01-29 DIAGNOSIS — I10 Essential (primary) hypertension: Secondary | ICD-10-CM | POA: Diagnosis not present

## 2017-01-29 DIAGNOSIS — Z8673 Personal history of transient ischemic attack (TIA), and cerebral infarction without residual deficits: Secondary | ICD-10-CM | POA: Diagnosis not present

## 2017-01-31 DIAGNOSIS — I10 Essential (primary) hypertension: Secondary | ICD-10-CM | POA: Diagnosis not present

## 2017-01-31 DIAGNOSIS — E785 Hyperlipidemia, unspecified: Secondary | ICD-10-CM | POA: Diagnosis not present

## 2017-01-31 DIAGNOSIS — Z8673 Personal history of transient ischemic attack (TIA), and cerebral infarction without residual deficits: Secondary | ICD-10-CM | POA: Diagnosis not present

## 2017-01-31 DIAGNOSIS — E119 Type 2 diabetes mellitus without complications: Secondary | ICD-10-CM | POA: Diagnosis not present

## 2017-01-31 DIAGNOSIS — G309 Alzheimer's disease, unspecified: Secondary | ICD-10-CM | POA: Diagnosis not present

## 2017-01-31 DIAGNOSIS — R2689 Other abnormalities of gait and mobility: Secondary | ICD-10-CM | POA: Diagnosis not present

## 2017-01-31 DIAGNOSIS — J449 Chronic obstructive pulmonary disease, unspecified: Secondary | ICD-10-CM | POA: Diagnosis not present

## 2017-02-04 DIAGNOSIS — E119 Type 2 diabetes mellitus without complications: Secondary | ICD-10-CM | POA: Diagnosis not present

## 2017-02-04 DIAGNOSIS — E785 Hyperlipidemia, unspecified: Secondary | ICD-10-CM | POA: Diagnosis not present

## 2017-02-04 DIAGNOSIS — Z8673 Personal history of transient ischemic attack (TIA), and cerebral infarction without residual deficits: Secondary | ICD-10-CM | POA: Diagnosis not present

## 2017-02-04 DIAGNOSIS — I10 Essential (primary) hypertension: Secondary | ICD-10-CM | POA: Diagnosis not present

## 2017-02-04 DIAGNOSIS — J449 Chronic obstructive pulmonary disease, unspecified: Secondary | ICD-10-CM | POA: Diagnosis not present

## 2017-02-04 DIAGNOSIS — G309 Alzheimer's disease, unspecified: Secondary | ICD-10-CM | POA: Diagnosis not present

## 2017-02-04 DIAGNOSIS — R2689 Other abnormalities of gait and mobility: Secondary | ICD-10-CM | POA: Diagnosis not present

## 2017-02-07 DIAGNOSIS — G309 Alzheimer's disease, unspecified: Secondary | ICD-10-CM | POA: Diagnosis not present

## 2017-02-07 DIAGNOSIS — I10 Essential (primary) hypertension: Secondary | ICD-10-CM | POA: Diagnosis not present

## 2017-02-07 DIAGNOSIS — Z8673 Personal history of transient ischemic attack (TIA), and cerebral infarction without residual deficits: Secondary | ICD-10-CM | POA: Diagnosis not present

## 2017-02-07 DIAGNOSIS — J449 Chronic obstructive pulmonary disease, unspecified: Secondary | ICD-10-CM | POA: Diagnosis not present

## 2017-02-07 DIAGNOSIS — E785 Hyperlipidemia, unspecified: Secondary | ICD-10-CM | POA: Diagnosis not present

## 2017-02-07 DIAGNOSIS — E119 Type 2 diabetes mellitus without complications: Secondary | ICD-10-CM | POA: Diagnosis not present

## 2017-02-07 DIAGNOSIS — R2689 Other abnormalities of gait and mobility: Secondary | ICD-10-CM | POA: Diagnosis not present

## 2017-02-10 DIAGNOSIS — E119 Type 2 diabetes mellitus without complications: Secondary | ICD-10-CM | POA: Diagnosis not present

## 2017-02-10 DIAGNOSIS — R2689 Other abnormalities of gait and mobility: Secondary | ICD-10-CM | POA: Diagnosis not present

## 2017-02-10 DIAGNOSIS — G309 Alzheimer's disease, unspecified: Secondary | ICD-10-CM | POA: Diagnosis not present

## 2017-02-10 DIAGNOSIS — E785 Hyperlipidemia, unspecified: Secondary | ICD-10-CM | POA: Diagnosis not present

## 2017-02-10 DIAGNOSIS — Z8673 Personal history of transient ischemic attack (TIA), and cerebral infarction without residual deficits: Secondary | ICD-10-CM | POA: Diagnosis not present

## 2017-02-10 DIAGNOSIS — I10 Essential (primary) hypertension: Secondary | ICD-10-CM | POA: Diagnosis not present

## 2017-02-10 DIAGNOSIS — J449 Chronic obstructive pulmonary disease, unspecified: Secondary | ICD-10-CM | POA: Diagnosis not present

## 2017-02-11 DIAGNOSIS — H2513 Age-related nuclear cataract, bilateral: Secondary | ICD-10-CM | POA: Diagnosis not present

## 2017-02-11 DIAGNOSIS — E113293 Type 2 diabetes mellitus with mild nonproliferative diabetic retinopathy without macular edema, bilateral: Secondary | ICD-10-CM | POA: Diagnosis not present

## 2017-02-14 DIAGNOSIS — Z8673 Personal history of transient ischemic attack (TIA), and cerebral infarction without residual deficits: Secondary | ICD-10-CM | POA: Diagnosis not present

## 2017-02-14 DIAGNOSIS — E785 Hyperlipidemia, unspecified: Secondary | ICD-10-CM | POA: Diagnosis not present

## 2017-02-14 DIAGNOSIS — I10 Essential (primary) hypertension: Secondary | ICD-10-CM | POA: Diagnosis not present

## 2017-02-14 DIAGNOSIS — G309 Alzheimer's disease, unspecified: Secondary | ICD-10-CM | POA: Diagnosis not present

## 2017-02-14 DIAGNOSIS — E119 Type 2 diabetes mellitus without complications: Secondary | ICD-10-CM | POA: Diagnosis not present

## 2017-02-14 DIAGNOSIS — R2689 Other abnormalities of gait and mobility: Secondary | ICD-10-CM | POA: Diagnosis not present

## 2017-02-14 DIAGNOSIS — J449 Chronic obstructive pulmonary disease, unspecified: Secondary | ICD-10-CM | POA: Diagnosis not present

## 2017-02-18 DIAGNOSIS — E119 Type 2 diabetes mellitus without complications: Secondary | ICD-10-CM | POA: Diagnosis not present

## 2017-02-18 DIAGNOSIS — I10 Essential (primary) hypertension: Secondary | ICD-10-CM | POA: Diagnosis not present

## 2017-02-18 DIAGNOSIS — J449 Chronic obstructive pulmonary disease, unspecified: Secondary | ICD-10-CM | POA: Diagnosis not present

## 2017-02-18 DIAGNOSIS — Z8673 Personal history of transient ischemic attack (TIA), and cerebral infarction without residual deficits: Secondary | ICD-10-CM | POA: Diagnosis not present

## 2017-02-18 DIAGNOSIS — G309 Alzheimer's disease, unspecified: Secondary | ICD-10-CM | POA: Diagnosis not present

## 2017-02-18 DIAGNOSIS — E785 Hyperlipidemia, unspecified: Secondary | ICD-10-CM | POA: Diagnosis not present

## 2017-02-18 DIAGNOSIS — R2689 Other abnormalities of gait and mobility: Secondary | ICD-10-CM | POA: Diagnosis not present

## 2017-02-19 DIAGNOSIS — Z8673 Personal history of transient ischemic attack (TIA), and cerebral infarction without residual deficits: Secondary | ICD-10-CM | POA: Diagnosis not present

## 2017-02-19 DIAGNOSIS — E785 Hyperlipidemia, unspecified: Secondary | ICD-10-CM | POA: Diagnosis not present

## 2017-02-19 DIAGNOSIS — G309 Alzheimer's disease, unspecified: Secondary | ICD-10-CM | POA: Diagnosis not present

## 2017-02-19 DIAGNOSIS — I10 Essential (primary) hypertension: Secondary | ICD-10-CM | POA: Diagnosis not present

## 2017-02-19 DIAGNOSIS — R2689 Other abnormalities of gait and mobility: Secondary | ICD-10-CM | POA: Diagnosis not present

## 2017-02-19 DIAGNOSIS — J449 Chronic obstructive pulmonary disease, unspecified: Secondary | ICD-10-CM | POA: Diagnosis not present

## 2017-02-19 DIAGNOSIS — E119 Type 2 diabetes mellitus without complications: Secondary | ICD-10-CM | POA: Diagnosis not present

## 2017-02-26 DIAGNOSIS — E119 Type 2 diabetes mellitus without complications: Secondary | ICD-10-CM | POA: Diagnosis not present

## 2017-02-26 DIAGNOSIS — R2689 Other abnormalities of gait and mobility: Secondary | ICD-10-CM | POA: Diagnosis not present

## 2017-02-26 DIAGNOSIS — G309 Alzheimer's disease, unspecified: Secondary | ICD-10-CM | POA: Diagnosis not present

## 2017-02-26 DIAGNOSIS — J449 Chronic obstructive pulmonary disease, unspecified: Secondary | ICD-10-CM | POA: Diagnosis not present

## 2017-02-26 DIAGNOSIS — Z8673 Personal history of transient ischemic attack (TIA), and cerebral infarction without residual deficits: Secondary | ICD-10-CM | POA: Diagnosis not present

## 2017-02-26 DIAGNOSIS — E785 Hyperlipidemia, unspecified: Secondary | ICD-10-CM | POA: Diagnosis not present

## 2017-02-26 DIAGNOSIS — I10 Essential (primary) hypertension: Secondary | ICD-10-CM | POA: Diagnosis not present

## 2017-03-05 DIAGNOSIS — G309 Alzheimer's disease, unspecified: Secondary | ICD-10-CM | POA: Diagnosis not present

## 2017-03-05 DIAGNOSIS — J449 Chronic obstructive pulmonary disease, unspecified: Secondary | ICD-10-CM | POA: Diagnosis not present

## 2017-03-05 DIAGNOSIS — Z8673 Personal history of transient ischemic attack (TIA), and cerebral infarction without residual deficits: Secondary | ICD-10-CM | POA: Diagnosis not present

## 2017-03-05 DIAGNOSIS — I10 Essential (primary) hypertension: Secondary | ICD-10-CM | POA: Diagnosis not present

## 2017-03-05 DIAGNOSIS — E785 Hyperlipidemia, unspecified: Secondary | ICD-10-CM | POA: Diagnosis not present

## 2017-03-05 DIAGNOSIS — E119 Type 2 diabetes mellitus without complications: Secondary | ICD-10-CM | POA: Diagnosis not present

## 2017-03-05 DIAGNOSIS — R2689 Other abnormalities of gait and mobility: Secondary | ICD-10-CM | POA: Diagnosis not present

## 2017-03-06 DIAGNOSIS — Z8673 Personal history of transient ischemic attack (TIA), and cerebral infarction without residual deficits: Secondary | ICD-10-CM | POA: Diagnosis not present

## 2017-03-06 DIAGNOSIS — I10 Essential (primary) hypertension: Secondary | ICD-10-CM | POA: Diagnosis not present

## 2017-03-06 DIAGNOSIS — G309 Alzheimer's disease, unspecified: Secondary | ICD-10-CM | POA: Diagnosis not present

## 2017-03-06 DIAGNOSIS — E785 Hyperlipidemia, unspecified: Secondary | ICD-10-CM | POA: Diagnosis not present

## 2017-03-06 DIAGNOSIS — R2689 Other abnormalities of gait and mobility: Secondary | ICD-10-CM | POA: Diagnosis not present

## 2017-03-06 DIAGNOSIS — J449 Chronic obstructive pulmonary disease, unspecified: Secondary | ICD-10-CM | POA: Diagnosis not present

## 2017-03-06 DIAGNOSIS — E119 Type 2 diabetes mellitus without complications: Secondary | ICD-10-CM | POA: Diagnosis not present

## 2017-03-07 DIAGNOSIS — E11319 Type 2 diabetes mellitus with unspecified diabetic retinopathy without macular edema: Secondary | ICD-10-CM | POA: Diagnosis not present

## 2017-03-07 DIAGNOSIS — I10 Essential (primary) hypertension: Secondary | ICD-10-CM | POA: Diagnosis not present

## 2017-03-07 DIAGNOSIS — E785 Hyperlipidemia, unspecified: Secondary | ICD-10-CM | POA: Diagnosis not present

## 2017-03-11 DIAGNOSIS — G309 Alzheimer's disease, unspecified: Secondary | ICD-10-CM | POA: Diagnosis not present

## 2017-03-11 DIAGNOSIS — J449 Chronic obstructive pulmonary disease, unspecified: Secondary | ICD-10-CM | POA: Diagnosis not present

## 2017-03-11 DIAGNOSIS — I10 Essential (primary) hypertension: Secondary | ICD-10-CM | POA: Diagnosis not present

## 2017-03-11 DIAGNOSIS — R2689 Other abnormalities of gait and mobility: Secondary | ICD-10-CM | POA: Diagnosis not present

## 2017-03-11 DIAGNOSIS — E119 Type 2 diabetes mellitus without complications: Secondary | ICD-10-CM | POA: Diagnosis not present

## 2017-03-11 DIAGNOSIS — E785 Hyperlipidemia, unspecified: Secondary | ICD-10-CM | POA: Diagnosis not present

## 2017-03-11 DIAGNOSIS — Z8673 Personal history of transient ischemic attack (TIA), and cerebral infarction without residual deficits: Secondary | ICD-10-CM | POA: Diagnosis not present

## 2017-03-13 DIAGNOSIS — R2689 Other abnormalities of gait and mobility: Secondary | ICD-10-CM | POA: Diagnosis not present

## 2017-03-13 DIAGNOSIS — Z8673 Personal history of transient ischemic attack (TIA), and cerebral infarction without residual deficits: Secondary | ICD-10-CM | POA: Diagnosis not present

## 2017-03-13 DIAGNOSIS — G309 Alzheimer's disease, unspecified: Secondary | ICD-10-CM | POA: Diagnosis not present

## 2017-03-13 DIAGNOSIS — E119 Type 2 diabetes mellitus without complications: Secondary | ICD-10-CM | POA: Diagnosis not present

## 2017-03-13 DIAGNOSIS — J449 Chronic obstructive pulmonary disease, unspecified: Secondary | ICD-10-CM | POA: Diagnosis not present

## 2017-03-13 DIAGNOSIS — I10 Essential (primary) hypertension: Secondary | ICD-10-CM | POA: Diagnosis not present

## 2017-03-13 DIAGNOSIS — E785 Hyperlipidemia, unspecified: Secondary | ICD-10-CM | POA: Diagnosis not present

## 2017-03-19 DIAGNOSIS — J449 Chronic obstructive pulmonary disease, unspecified: Secondary | ICD-10-CM | POA: Diagnosis not present

## 2017-03-19 DIAGNOSIS — E119 Type 2 diabetes mellitus without complications: Secondary | ICD-10-CM | POA: Diagnosis not present

## 2017-03-19 DIAGNOSIS — Z8673 Personal history of transient ischemic attack (TIA), and cerebral infarction without residual deficits: Secondary | ICD-10-CM | POA: Diagnosis not present

## 2017-03-19 DIAGNOSIS — E785 Hyperlipidemia, unspecified: Secondary | ICD-10-CM | POA: Diagnosis not present

## 2017-03-19 DIAGNOSIS — I10 Essential (primary) hypertension: Secondary | ICD-10-CM | POA: Diagnosis not present

## 2017-03-19 DIAGNOSIS — R2689 Other abnormalities of gait and mobility: Secondary | ICD-10-CM | POA: Diagnosis not present

## 2017-03-19 DIAGNOSIS — G309 Alzheimer's disease, unspecified: Secondary | ICD-10-CM | POA: Diagnosis not present

## 2017-03-21 DIAGNOSIS — Z8673 Personal history of transient ischemic attack (TIA), and cerebral infarction without residual deficits: Secondary | ICD-10-CM | POA: Diagnosis not present

## 2017-03-21 DIAGNOSIS — J449 Chronic obstructive pulmonary disease, unspecified: Secondary | ICD-10-CM | POA: Diagnosis not present

## 2017-03-21 DIAGNOSIS — E785 Hyperlipidemia, unspecified: Secondary | ICD-10-CM | POA: Diagnosis not present

## 2017-03-21 DIAGNOSIS — I10 Essential (primary) hypertension: Secondary | ICD-10-CM | POA: Diagnosis not present

## 2017-03-21 DIAGNOSIS — E119 Type 2 diabetes mellitus without complications: Secondary | ICD-10-CM | POA: Diagnosis not present

## 2017-03-21 DIAGNOSIS — R2689 Other abnormalities of gait and mobility: Secondary | ICD-10-CM | POA: Diagnosis not present

## 2017-03-21 DIAGNOSIS — G309 Alzheimer's disease, unspecified: Secondary | ICD-10-CM | POA: Diagnosis not present

## 2017-03-25 DIAGNOSIS — Z8673 Personal history of transient ischemic attack (TIA), and cerebral infarction without residual deficits: Secondary | ICD-10-CM | POA: Diagnosis not present

## 2017-03-25 DIAGNOSIS — E785 Hyperlipidemia, unspecified: Secondary | ICD-10-CM | POA: Diagnosis not present

## 2017-03-25 DIAGNOSIS — J449 Chronic obstructive pulmonary disease, unspecified: Secondary | ICD-10-CM | POA: Diagnosis not present

## 2017-03-25 DIAGNOSIS — E119 Type 2 diabetes mellitus without complications: Secondary | ICD-10-CM | POA: Diagnosis not present

## 2017-03-25 DIAGNOSIS — R2689 Other abnormalities of gait and mobility: Secondary | ICD-10-CM | POA: Diagnosis not present

## 2017-03-25 DIAGNOSIS — I10 Essential (primary) hypertension: Secondary | ICD-10-CM | POA: Diagnosis not present

## 2017-03-25 DIAGNOSIS — G309 Alzheimer's disease, unspecified: Secondary | ICD-10-CM | POA: Diagnosis not present

## 2017-03-28 DIAGNOSIS — Z8673 Personal history of transient ischemic attack (TIA), and cerebral infarction without residual deficits: Secondary | ICD-10-CM | POA: Diagnosis not present

## 2017-03-28 DIAGNOSIS — G309 Alzheimer's disease, unspecified: Secondary | ICD-10-CM | POA: Diagnosis not present

## 2017-03-28 DIAGNOSIS — I10 Essential (primary) hypertension: Secondary | ICD-10-CM | POA: Diagnosis not present

## 2017-03-28 DIAGNOSIS — J449 Chronic obstructive pulmonary disease, unspecified: Secondary | ICD-10-CM | POA: Diagnosis not present

## 2017-03-28 DIAGNOSIS — R2689 Other abnormalities of gait and mobility: Secondary | ICD-10-CM | POA: Diagnosis not present

## 2017-03-28 DIAGNOSIS — E785 Hyperlipidemia, unspecified: Secondary | ICD-10-CM | POA: Diagnosis not present

## 2017-03-28 DIAGNOSIS — E119 Type 2 diabetes mellitus without complications: Secondary | ICD-10-CM | POA: Diagnosis not present

## 2017-03-31 DIAGNOSIS — R2689 Other abnormalities of gait and mobility: Secondary | ICD-10-CM | POA: Diagnosis not present

## 2017-03-31 DIAGNOSIS — I10 Essential (primary) hypertension: Secondary | ICD-10-CM | POA: Diagnosis not present

## 2017-03-31 DIAGNOSIS — J449 Chronic obstructive pulmonary disease, unspecified: Secondary | ICD-10-CM | POA: Diagnosis not present

## 2017-03-31 DIAGNOSIS — E785 Hyperlipidemia, unspecified: Secondary | ICD-10-CM | POA: Diagnosis not present

## 2017-03-31 DIAGNOSIS — Z8673 Personal history of transient ischemic attack (TIA), and cerebral infarction without residual deficits: Secondary | ICD-10-CM | POA: Diagnosis not present

## 2017-03-31 DIAGNOSIS — G309 Alzheimer's disease, unspecified: Secondary | ICD-10-CM | POA: Diagnosis not present

## 2017-03-31 DIAGNOSIS — E119 Type 2 diabetes mellitus without complications: Secondary | ICD-10-CM | POA: Diagnosis not present

## 2017-03-31 DIAGNOSIS — E11319 Type 2 diabetes mellitus with unspecified diabetic retinopathy without macular edema: Secondary | ICD-10-CM | POA: Diagnosis not present

## 2017-04-01 DIAGNOSIS — E038 Other specified hypothyroidism: Secondary | ICD-10-CM | POA: Diagnosis not present

## 2017-04-01 DIAGNOSIS — Z79899 Other long term (current) drug therapy: Secondary | ICD-10-CM | POA: Diagnosis not present

## 2017-04-01 DIAGNOSIS — E782 Mixed hyperlipidemia: Secondary | ICD-10-CM | POA: Diagnosis not present

## 2017-04-01 DIAGNOSIS — E119 Type 2 diabetes mellitus without complications: Secondary | ICD-10-CM | POA: Diagnosis not present

## 2017-04-01 DIAGNOSIS — E559 Vitamin D deficiency, unspecified: Secondary | ICD-10-CM | POA: Diagnosis not present

## 2017-04-01 DIAGNOSIS — D518 Other vitamin B12 deficiency anemias: Secondary | ICD-10-CM | POA: Diagnosis not present

## 2017-04-07 DIAGNOSIS — G309 Alzheimer's disease, unspecified: Secondary | ICD-10-CM | POA: Diagnosis not present

## 2017-04-07 DIAGNOSIS — I10 Essential (primary) hypertension: Secondary | ICD-10-CM | POA: Diagnosis not present

## 2017-04-07 DIAGNOSIS — E119 Type 2 diabetes mellitus without complications: Secondary | ICD-10-CM | POA: Diagnosis not present

## 2017-04-07 DIAGNOSIS — J449 Chronic obstructive pulmonary disease, unspecified: Secondary | ICD-10-CM | POA: Diagnosis not present

## 2017-04-07 DIAGNOSIS — R2689 Other abnormalities of gait and mobility: Secondary | ICD-10-CM | POA: Diagnosis not present

## 2017-04-07 DIAGNOSIS — E785 Hyperlipidemia, unspecified: Secondary | ICD-10-CM | POA: Diagnosis not present

## 2017-04-07 DIAGNOSIS — Z8673 Personal history of transient ischemic attack (TIA), and cerebral infarction without residual deficits: Secondary | ICD-10-CM | POA: Diagnosis not present

## 2017-04-11 DIAGNOSIS — E785 Hyperlipidemia, unspecified: Secondary | ICD-10-CM | POA: Diagnosis not present

## 2017-04-11 DIAGNOSIS — J449 Chronic obstructive pulmonary disease, unspecified: Secondary | ICD-10-CM | POA: Diagnosis not present

## 2017-04-11 DIAGNOSIS — Z8673 Personal history of transient ischemic attack (TIA), and cerebral infarction without residual deficits: Secondary | ICD-10-CM | POA: Diagnosis not present

## 2017-04-11 DIAGNOSIS — I10 Essential (primary) hypertension: Secondary | ICD-10-CM | POA: Diagnosis not present

## 2017-04-11 DIAGNOSIS — E119 Type 2 diabetes mellitus without complications: Secondary | ICD-10-CM | POA: Diagnosis not present

## 2017-04-11 DIAGNOSIS — G309 Alzheimer's disease, unspecified: Secondary | ICD-10-CM | POA: Diagnosis not present

## 2017-04-11 DIAGNOSIS — R2689 Other abnormalities of gait and mobility: Secondary | ICD-10-CM | POA: Diagnosis not present

## 2017-04-16 DIAGNOSIS — E785 Hyperlipidemia, unspecified: Secondary | ICD-10-CM | POA: Diagnosis not present

## 2017-04-16 DIAGNOSIS — Z8673 Personal history of transient ischemic attack (TIA), and cerebral infarction without residual deficits: Secondary | ICD-10-CM | POA: Diagnosis not present

## 2017-04-16 DIAGNOSIS — G309 Alzheimer's disease, unspecified: Secondary | ICD-10-CM | POA: Diagnosis not present

## 2017-04-16 DIAGNOSIS — E119 Type 2 diabetes mellitus without complications: Secondary | ICD-10-CM | POA: Diagnosis not present

## 2017-04-16 DIAGNOSIS — I10 Essential (primary) hypertension: Secondary | ICD-10-CM | POA: Diagnosis not present

## 2017-04-16 DIAGNOSIS — R2689 Other abnormalities of gait and mobility: Secondary | ICD-10-CM | POA: Diagnosis not present

## 2017-04-16 DIAGNOSIS — J449 Chronic obstructive pulmonary disease, unspecified: Secondary | ICD-10-CM | POA: Diagnosis not present

## 2017-04-17 DIAGNOSIS — E119 Type 2 diabetes mellitus without complications: Secondary | ICD-10-CM | POA: Diagnosis not present

## 2017-04-17 DIAGNOSIS — E785 Hyperlipidemia, unspecified: Secondary | ICD-10-CM | POA: Diagnosis not present

## 2017-04-17 DIAGNOSIS — Z8673 Personal history of transient ischemic attack (TIA), and cerebral infarction without residual deficits: Secondary | ICD-10-CM | POA: Diagnosis not present

## 2017-04-17 DIAGNOSIS — J449 Chronic obstructive pulmonary disease, unspecified: Secondary | ICD-10-CM | POA: Diagnosis not present

## 2017-04-17 DIAGNOSIS — I10 Essential (primary) hypertension: Secondary | ICD-10-CM | POA: Diagnosis not present

## 2017-04-17 DIAGNOSIS — R2689 Other abnormalities of gait and mobility: Secondary | ICD-10-CM | POA: Diagnosis not present

## 2017-04-17 DIAGNOSIS — G309 Alzheimer's disease, unspecified: Secondary | ICD-10-CM | POA: Diagnosis not present

## 2017-04-22 DIAGNOSIS — R2689 Other abnormalities of gait and mobility: Secondary | ICD-10-CM | POA: Diagnosis not present

## 2017-04-22 DIAGNOSIS — Z8673 Personal history of transient ischemic attack (TIA), and cerebral infarction without residual deficits: Secondary | ICD-10-CM | POA: Diagnosis not present

## 2017-04-22 DIAGNOSIS — G309 Alzheimer's disease, unspecified: Secondary | ICD-10-CM | POA: Diagnosis not present

## 2017-04-22 DIAGNOSIS — E785 Hyperlipidemia, unspecified: Secondary | ICD-10-CM | POA: Diagnosis not present

## 2017-04-22 DIAGNOSIS — E119 Type 2 diabetes mellitus without complications: Secondary | ICD-10-CM | POA: Diagnosis not present

## 2017-04-22 DIAGNOSIS — I10 Essential (primary) hypertension: Secondary | ICD-10-CM | POA: Diagnosis not present

## 2017-04-22 DIAGNOSIS — J449 Chronic obstructive pulmonary disease, unspecified: Secondary | ICD-10-CM | POA: Diagnosis not present

## 2017-04-28 DIAGNOSIS — E785 Hyperlipidemia, unspecified: Secondary | ICD-10-CM | POA: Diagnosis not present

## 2017-04-28 DIAGNOSIS — E11319 Type 2 diabetes mellitus with unspecified diabetic retinopathy without macular edema: Secondary | ICD-10-CM | POA: Diagnosis not present

## 2017-04-28 DIAGNOSIS — I10 Essential (primary) hypertension: Secondary | ICD-10-CM | POA: Diagnosis not present

## 2017-05-23 DIAGNOSIS — B351 Tinea unguium: Secondary | ICD-10-CM | POA: Diagnosis not present

## 2017-05-23 DIAGNOSIS — E11319 Type 2 diabetes mellitus with unspecified diabetic retinopathy without macular edema: Secondary | ICD-10-CM | POA: Diagnosis not present

## 2017-05-23 DIAGNOSIS — L84 Corns and callosities: Secondary | ICD-10-CM | POA: Diagnosis not present

## 2017-05-23 DIAGNOSIS — M216X2 Other acquired deformities of left foot: Secondary | ICD-10-CM | POA: Diagnosis not present

## 2017-05-26 DIAGNOSIS — I10 Essential (primary) hypertension: Secondary | ICD-10-CM | POA: Diagnosis not present

## 2017-05-26 DIAGNOSIS — E11319 Type 2 diabetes mellitus with unspecified diabetic retinopathy without macular edema: Secondary | ICD-10-CM | POA: Diagnosis not present

## 2017-05-26 DIAGNOSIS — E785 Hyperlipidemia, unspecified: Secondary | ICD-10-CM | POA: Diagnosis not present

## 2017-06-05 ENCOUNTER — Ambulatory Visit (INDEPENDENT_AMBULATORY_CARE_PROVIDER_SITE_OTHER): Payer: Self-pay | Admitting: Vascular Surgery

## 2017-06-05 ENCOUNTER — Encounter (INDEPENDENT_AMBULATORY_CARE_PROVIDER_SITE_OTHER): Payer: Medicare Other

## 2017-06-06 ENCOUNTER — Telehealth: Payer: Self-pay | Admitting: Family Medicine

## 2017-06-06 NOTE — Telephone Encounter (Signed)
Called pt to schedule for Annual Wellness Visit with Nurse Health Advisor, Tiffany Hill, my c/b # is [phone]  Kathryn Brown ° °

## 2017-06-16 DIAGNOSIS — R05 Cough: Secondary | ICD-10-CM | POA: Diagnosis not present

## 2017-06-19 DIAGNOSIS — R05 Cough: Secondary | ICD-10-CM | POA: Diagnosis not present

## 2017-06-24 DIAGNOSIS — E785 Hyperlipidemia, unspecified: Secondary | ICD-10-CM | POA: Diagnosis not present

## 2017-06-24 DIAGNOSIS — E11319 Type 2 diabetes mellitus with unspecified diabetic retinopathy without macular edema: Secondary | ICD-10-CM | POA: Diagnosis not present

## 2017-06-24 DIAGNOSIS — I10 Essential (primary) hypertension: Secondary | ICD-10-CM | POA: Diagnosis not present

## 2017-06-27 DIAGNOSIS — Z79899 Other long term (current) drug therapy: Secondary | ICD-10-CM | POA: Diagnosis not present

## 2017-06-27 DIAGNOSIS — E038 Other specified hypothyroidism: Secondary | ICD-10-CM | POA: Diagnosis not present

## 2017-06-27 DIAGNOSIS — D518 Other vitamin B12 deficiency anemias: Secondary | ICD-10-CM | POA: Diagnosis not present

## 2017-06-27 DIAGNOSIS — E782 Mixed hyperlipidemia: Secondary | ICD-10-CM | POA: Diagnosis not present

## 2017-06-27 DIAGNOSIS — E559 Vitamin D deficiency, unspecified: Secondary | ICD-10-CM | POA: Diagnosis not present

## 2017-06-27 DIAGNOSIS — E119 Type 2 diabetes mellitus without complications: Secondary | ICD-10-CM | POA: Diagnosis not present

## 2017-07-03 NOTE — Telephone Encounter (Signed)
Called to schedule medicare wellness visit. Was informed patient is in rest home.

## 2017-07-22 DIAGNOSIS — E785 Hyperlipidemia, unspecified: Secondary | ICD-10-CM | POA: Diagnosis not present

## 2017-07-22 DIAGNOSIS — E11319 Type 2 diabetes mellitus with unspecified diabetic retinopathy without macular edema: Secondary | ICD-10-CM | POA: Diagnosis not present

## 2017-07-22 DIAGNOSIS — I10 Essential (primary) hypertension: Secondary | ICD-10-CM | POA: Diagnosis not present

## 2017-08-19 DIAGNOSIS — I10 Essential (primary) hypertension: Secondary | ICD-10-CM | POA: Diagnosis not present

## 2017-08-19 DIAGNOSIS — E785 Hyperlipidemia, unspecified: Secondary | ICD-10-CM | POA: Diagnosis not present

## 2017-08-19 DIAGNOSIS — E11319 Type 2 diabetes mellitus with unspecified diabetic retinopathy without macular edema: Secondary | ICD-10-CM | POA: Diagnosis not present

## 2017-09-09 DIAGNOSIS — E119 Type 2 diabetes mellitus without complications: Secondary | ICD-10-CM | POA: Diagnosis not present

## 2017-09-09 DIAGNOSIS — E559 Vitamin D deficiency, unspecified: Secondary | ICD-10-CM | POA: Diagnosis not present

## 2017-09-09 DIAGNOSIS — D518 Other vitamin B12 deficiency anemias: Secondary | ICD-10-CM | POA: Diagnosis not present

## 2017-09-09 DIAGNOSIS — Z79899 Other long term (current) drug therapy: Secondary | ICD-10-CM | POA: Diagnosis not present

## 2017-09-09 DIAGNOSIS — E038 Other specified hypothyroidism: Secondary | ICD-10-CM | POA: Diagnosis not present

## 2017-09-09 DIAGNOSIS — E7849 Other hyperlipidemia: Secondary | ICD-10-CM | POA: Diagnosis not present

## 2017-09-16 DIAGNOSIS — Z0000 Encounter for general adult medical examination without abnormal findings: Secondary | ICD-10-CM | POA: Diagnosis not present

## 2017-09-16 DIAGNOSIS — E11319 Type 2 diabetes mellitus with unspecified diabetic retinopathy without macular edema: Secondary | ICD-10-CM | POA: Diagnosis not present

## 2017-09-16 DIAGNOSIS — E785 Hyperlipidemia, unspecified: Secondary | ICD-10-CM | POA: Diagnosis not present

## 2017-09-16 DIAGNOSIS — I10 Essential (primary) hypertension: Secondary | ICD-10-CM | POA: Diagnosis not present

## 2017-09-16 DIAGNOSIS — J449 Chronic obstructive pulmonary disease, unspecified: Secondary | ICD-10-CM | POA: Diagnosis not present

## 2017-10-03 DIAGNOSIS — E11319 Type 2 diabetes mellitus with unspecified diabetic retinopathy without macular edema: Secondary | ICD-10-CM | POA: Diagnosis not present

## 2017-10-03 DIAGNOSIS — E785 Hyperlipidemia, unspecified: Secondary | ICD-10-CM | POA: Diagnosis not present

## 2017-10-03 DIAGNOSIS — I10 Essential (primary) hypertension: Secondary | ICD-10-CM | POA: Diagnosis not present

## 2017-10-20 DIAGNOSIS — E785 Hyperlipidemia, unspecified: Secondary | ICD-10-CM | POA: Diagnosis not present

## 2017-10-20 DIAGNOSIS — E11319 Type 2 diabetes mellitus with unspecified diabetic retinopathy without macular edema: Secondary | ICD-10-CM | POA: Diagnosis not present

## 2017-10-20 DIAGNOSIS — I10 Essential (primary) hypertension: Secondary | ICD-10-CM | POA: Diagnosis not present

## 2017-11-11 DIAGNOSIS — E11319 Type 2 diabetes mellitus with unspecified diabetic retinopathy without macular edema: Secondary | ICD-10-CM | POA: Diagnosis not present

## 2017-11-11 DIAGNOSIS — B351 Tinea unguium: Secondary | ICD-10-CM | POA: Diagnosis not present

## 2017-11-18 DIAGNOSIS — E11319 Type 2 diabetes mellitus with unspecified diabetic retinopathy without macular edema: Secondary | ICD-10-CM | POA: Diagnosis not present

## 2017-11-18 DIAGNOSIS — E785 Hyperlipidemia, unspecified: Secondary | ICD-10-CM | POA: Diagnosis not present

## 2017-11-18 DIAGNOSIS — I10 Essential (primary) hypertension: Secondary | ICD-10-CM | POA: Diagnosis not present

## 2017-12-16 DIAGNOSIS — G309 Alzheimer's disease, unspecified: Secondary | ICD-10-CM | POA: Diagnosis not present

## 2017-12-16 DIAGNOSIS — J449 Chronic obstructive pulmonary disease, unspecified: Secondary | ICD-10-CM | POA: Diagnosis not present

## 2017-12-16 DIAGNOSIS — E11319 Type 2 diabetes mellitus with unspecified diabetic retinopathy without macular edema: Secondary | ICD-10-CM | POA: Diagnosis not present

## 2017-12-23 DIAGNOSIS — E038 Other specified hypothyroidism: Secondary | ICD-10-CM | POA: Diagnosis not present

## 2017-12-23 DIAGNOSIS — Z79899 Other long term (current) drug therapy: Secondary | ICD-10-CM | POA: Diagnosis not present

## 2017-12-23 DIAGNOSIS — E7849 Other hyperlipidemia: Secondary | ICD-10-CM | POA: Diagnosis not present

## 2017-12-23 DIAGNOSIS — E559 Vitamin D deficiency, unspecified: Secondary | ICD-10-CM | POA: Diagnosis not present

## 2017-12-23 DIAGNOSIS — E119 Type 2 diabetes mellitus without complications: Secondary | ICD-10-CM | POA: Diagnosis not present

## 2017-12-23 DIAGNOSIS — D518 Other vitamin B12 deficiency anemias: Secondary | ICD-10-CM | POA: Diagnosis not present

## 2018-01-13 DIAGNOSIS — I10 Essential (primary) hypertension: Secondary | ICD-10-CM | POA: Diagnosis not present

## 2018-01-13 DIAGNOSIS — G309 Alzheimer's disease, unspecified: Secondary | ICD-10-CM | POA: Diagnosis not present

## 2018-01-13 DIAGNOSIS — J449 Chronic obstructive pulmonary disease, unspecified: Secondary | ICD-10-CM | POA: Diagnosis not present

## 2018-01-13 DIAGNOSIS — E11319 Type 2 diabetes mellitus with unspecified diabetic retinopathy without macular edema: Secondary | ICD-10-CM | POA: Diagnosis not present

## 2018-02-03 DIAGNOSIS — G309 Alzheimer's disease, unspecified: Secondary | ICD-10-CM | POA: Diagnosis not present

## 2018-02-04 DIAGNOSIS — E038 Other specified hypothyroidism: Secondary | ICD-10-CM | POA: Diagnosis not present

## 2018-02-04 DIAGNOSIS — E7849 Other hyperlipidemia: Secondary | ICD-10-CM | POA: Diagnosis not present

## 2018-02-04 DIAGNOSIS — D518 Other vitamin B12 deficiency anemias: Secondary | ICD-10-CM | POA: Diagnosis not present

## 2018-02-04 DIAGNOSIS — E119 Type 2 diabetes mellitus without complications: Secondary | ICD-10-CM | POA: Diagnosis not present

## 2018-02-04 DIAGNOSIS — Z79899 Other long term (current) drug therapy: Secondary | ICD-10-CM | POA: Diagnosis not present

## 2018-02-04 DIAGNOSIS — E559 Vitamin D deficiency, unspecified: Secondary | ICD-10-CM | POA: Diagnosis not present

## 2018-02-10 DIAGNOSIS — J449 Chronic obstructive pulmonary disease, unspecified: Secondary | ICD-10-CM | POA: Diagnosis not present

## 2018-02-10 DIAGNOSIS — E11319 Type 2 diabetes mellitus with unspecified diabetic retinopathy without macular edema: Secondary | ICD-10-CM | POA: Diagnosis not present

## 2018-02-10 DIAGNOSIS — G309 Alzheimer's disease, unspecified: Secondary | ICD-10-CM | POA: Diagnosis not present

## 2018-02-10 DIAGNOSIS — E785 Hyperlipidemia, unspecified: Secondary | ICD-10-CM | POA: Diagnosis not present

## 2018-03-09 DIAGNOSIS — R221 Localized swelling, mass and lump, neck: Secondary | ICD-10-CM | POA: Diagnosis not present

## 2018-03-09 DIAGNOSIS — I739 Peripheral vascular disease, unspecified: Secondary | ICD-10-CM | POA: Diagnosis not present

## 2018-03-09 DIAGNOSIS — R0989 Other specified symptoms and signs involving the circulatory and respiratory systems: Secondary | ICD-10-CM | POA: Diagnosis not present

## 2018-03-09 DIAGNOSIS — I714 Abdominal aortic aneurysm, without rupture: Secondary | ICD-10-CM | POA: Diagnosis not present

## 2018-03-10 DIAGNOSIS — E11319 Type 2 diabetes mellitus with unspecified diabetic retinopathy without macular edema: Secondary | ICD-10-CM | POA: Diagnosis not present

## 2018-03-10 DIAGNOSIS — E785 Hyperlipidemia, unspecified: Secondary | ICD-10-CM | POA: Diagnosis not present

## 2018-03-10 DIAGNOSIS — I10 Essential (primary) hypertension: Secondary | ICD-10-CM | POA: Diagnosis not present

## 2018-03-10 DIAGNOSIS — R011 Cardiac murmur, unspecified: Secondary | ICD-10-CM | POA: Diagnosis not present

## 2018-03-10 DIAGNOSIS — J449 Chronic obstructive pulmonary disease, unspecified: Secondary | ICD-10-CM | POA: Diagnosis not present

## 2018-03-11 DIAGNOSIS — R0989 Other specified symptoms and signs involving the circulatory and respiratory systems: Secondary | ICD-10-CM | POA: Diagnosis not present

## 2018-03-11 DIAGNOSIS — I739 Peripheral vascular disease, unspecified: Secondary | ICD-10-CM | POA: Diagnosis not present

## 2018-03-11 DIAGNOSIS — R221 Localized swelling, mass and lump, neck: Secondary | ICD-10-CM | POA: Diagnosis not present

## 2018-03-12 DIAGNOSIS — E119 Type 2 diabetes mellitus without complications: Secondary | ICD-10-CM | POA: Diagnosis not present

## 2018-03-12 DIAGNOSIS — Z79899 Other long term (current) drug therapy: Secondary | ICD-10-CM | POA: Diagnosis not present

## 2018-03-24 DIAGNOSIS — E7849 Other hyperlipidemia: Secondary | ICD-10-CM | POA: Diagnosis not present

## 2018-03-24 DIAGNOSIS — E119 Type 2 diabetes mellitus without complications: Secondary | ICD-10-CM | POA: Diagnosis not present

## 2018-03-24 DIAGNOSIS — D518 Other vitamin B12 deficiency anemias: Secondary | ICD-10-CM | POA: Diagnosis not present

## 2018-03-24 DIAGNOSIS — E038 Other specified hypothyroidism: Secondary | ICD-10-CM | POA: Diagnosis not present

## 2018-03-24 DIAGNOSIS — Z79899 Other long term (current) drug therapy: Secondary | ICD-10-CM | POA: Diagnosis not present

## 2018-03-24 DIAGNOSIS — E559 Vitamin D deficiency, unspecified: Secondary | ICD-10-CM | POA: Diagnosis not present

## 2018-04-14 DIAGNOSIS — E785 Hyperlipidemia, unspecified: Secondary | ICD-10-CM | POA: Diagnosis not present

## 2018-04-14 DIAGNOSIS — E11319 Type 2 diabetes mellitus with unspecified diabetic retinopathy without macular edema: Secondary | ICD-10-CM | POA: Diagnosis not present

## 2018-04-14 DIAGNOSIS — J449 Chronic obstructive pulmonary disease, unspecified: Secondary | ICD-10-CM | POA: Diagnosis not present

## 2018-04-14 DIAGNOSIS — I10 Essential (primary) hypertension: Secondary | ICD-10-CM | POA: Diagnosis not present

## 2018-05-08 ENCOUNTER — Other Ambulatory Visit: Payer: Self-pay | Admitting: Family Medicine

## 2018-05-12 DIAGNOSIS — G309 Alzheimer's disease, unspecified: Secondary | ICD-10-CM | POA: Diagnosis not present

## 2018-05-12 DIAGNOSIS — E11319 Type 2 diabetes mellitus with unspecified diabetic retinopathy without macular edema: Secondary | ICD-10-CM | POA: Diagnosis not present

## 2018-05-12 DIAGNOSIS — J449 Chronic obstructive pulmonary disease, unspecified: Secondary | ICD-10-CM | POA: Diagnosis not present

## 2018-05-12 DIAGNOSIS — I10 Essential (primary) hypertension: Secondary | ICD-10-CM | POA: Diagnosis not present

## 2018-05-15 ENCOUNTER — Other Ambulatory Visit: Payer: Self-pay | Admitting: Family Medicine

## 2018-05-15 NOTE — Telephone Encounter (Signed)
[place] and pharmacist the Lantus prescription and pen needles were hand written on the FL-2 form. No information was found in the chart. Routing back to provider.

## 2018-05-15 NOTE — Telephone Encounter (Signed)
Patient has not been seen since 10/2016

## 2018-05-29 ENCOUNTER — Other Ambulatory Visit: Payer: Self-pay | Admitting: Family Medicine

## 2018-05-29 NOTE — Telephone Encounter (Addendum)
Patient called, left VM to return call to schedule an appointment in order to receive medication refills. [place] called and spoke to [facility], [employer]chant navy officer, advised the patient will need to schedule an OV in order to receive refills due to she hasn't been seen in 18 months. I asked is the patient in a facility, she says yes. I advised to have the doctor in the facility to prescribe her medications, she verbalized understanding.

## 2018-06-05 ENCOUNTER — Other Ambulatory Visit: Payer: Self-pay | Admitting: Family Medicine

## 2018-06-05 NOTE — Telephone Encounter (Signed)
The pharmacy has requested this medication refill. I am unable to find it on any medication listed for her. An office note on 06/06/18 say that the patient is now in a rest home.  Please review

## 2018-06-09 DIAGNOSIS — I10 Essential (primary) hypertension: Secondary | ICD-10-CM | POA: Diagnosis not present

## 2018-06-09 DIAGNOSIS — E785 Hyperlipidemia, unspecified: Secondary | ICD-10-CM | POA: Diagnosis not present

## 2018-06-09 DIAGNOSIS — J449 Chronic obstructive pulmonary disease, unspecified: Secondary | ICD-10-CM | POA: Diagnosis not present

## 2018-06-09 DIAGNOSIS — G309 Alzheimer's disease, unspecified: Secondary | ICD-10-CM | POA: Diagnosis not present

## 2018-06-22 ENCOUNTER — Other Ambulatory Visit: Payer: Self-pay | Admitting: Family Medicine

## 2018-06-22 NOTE — Telephone Encounter (Signed)
It's my understanding that this lady is now in a nursing home. I haven't seen her in over a year (10/2016)- can we confirm that she's in the nursing home, her doctor there should be writing these

## 2018-06-22 NOTE — Telephone Encounter (Signed)
Called and spoke to [employer] at [phone]. She stated RX request was meant to go to them. She asked [place] to disregard request.

## 2018-06-23 DIAGNOSIS — E038 Other specified hypothyroidism: Secondary | ICD-10-CM | POA: Diagnosis not present

## 2018-06-23 DIAGNOSIS — D518 Other vitamin B12 deficiency anemias: Secondary | ICD-10-CM | POA: Diagnosis not present

## 2018-06-23 DIAGNOSIS — E7849 Other hyperlipidemia: Secondary | ICD-10-CM | POA: Diagnosis not present

## 2018-06-23 DIAGNOSIS — Z79899 Other long term (current) drug therapy: Secondary | ICD-10-CM | POA: Diagnosis not present

## 2018-06-23 DIAGNOSIS — E119 Type 2 diabetes mellitus without complications: Secondary | ICD-10-CM | POA: Diagnosis not present

## 2018-06-23 DIAGNOSIS — E559 Vitamin D deficiency, unspecified: Secondary | ICD-10-CM | POA: Diagnosis not present

## 2018-07-07 DIAGNOSIS — E11319 Type 2 diabetes mellitus with unspecified diabetic retinopathy without macular edema: Secondary | ICD-10-CM | POA: Diagnosis not present

## 2018-07-07 DIAGNOSIS — E785 Hyperlipidemia, unspecified: Secondary | ICD-10-CM | POA: Diagnosis not present

## 2018-07-07 DIAGNOSIS — I10 Essential (primary) hypertension: Secondary | ICD-10-CM | POA: Diagnosis not present

## 2018-07-07 DIAGNOSIS — G309 Alzheimer's disease, unspecified: Secondary | ICD-10-CM | POA: Diagnosis not present

## 2018-07-10 DIAGNOSIS — B351 Tinea unguium: Secondary | ICD-10-CM | POA: Diagnosis not present

## 2018-07-10 DIAGNOSIS — E11319 Type 2 diabetes mellitus with unspecified diabetic retinopathy without macular edema: Secondary | ICD-10-CM | POA: Diagnosis not present

## 2018-07-16 DIAGNOSIS — I2510 Atherosclerotic heart disease of native coronary artery without angina pectoris: Secondary | ICD-10-CM | POA: Diagnosis not present

## 2018-07-17 DIAGNOSIS — D518 Other vitamin B12 deficiency anemias: Secondary | ICD-10-CM | POA: Diagnosis not present

## 2018-07-17 DIAGNOSIS — E038 Other specified hypothyroidism: Secondary | ICD-10-CM | POA: Diagnosis not present

## 2018-07-17 DIAGNOSIS — E119 Type 2 diabetes mellitus without complications: Secondary | ICD-10-CM | POA: Diagnosis not present

## 2018-07-24 DIAGNOSIS — I10 Essential (primary) hypertension: Secondary | ICD-10-CM | POA: Diagnosis not present

## 2018-07-24 DIAGNOSIS — Z794 Long term (current) use of insulin: Secondary | ICD-10-CM | POA: Diagnosis not present

## 2018-07-24 DIAGNOSIS — R262 Difficulty in walking, not elsewhere classified: Secondary | ICD-10-CM | POA: Diagnosis not present

## 2018-07-24 DIAGNOSIS — E785 Hyperlipidemia, unspecified: Secondary | ICD-10-CM | POA: Diagnosis not present

## 2018-07-24 DIAGNOSIS — I679 Cerebrovascular disease, unspecified: Secondary | ICD-10-CM | POA: Diagnosis not present

## 2018-07-24 DIAGNOSIS — Z7902 Long term (current) use of antithrombotics/antiplatelets: Secondary | ICD-10-CM | POA: Diagnosis not present

## 2018-07-24 DIAGNOSIS — G309 Alzheimer's disease, unspecified: Secondary | ICD-10-CM | POA: Diagnosis not present

## 2018-07-24 DIAGNOSIS — Z7982 Long term (current) use of aspirin: Secondary | ICD-10-CM | POA: Diagnosis not present

## 2018-07-24 DIAGNOSIS — E11319 Type 2 diabetes mellitus with unspecified diabetic retinopathy without macular edema: Secondary | ICD-10-CM | POA: Diagnosis not present

## 2018-07-24 DIAGNOSIS — J449 Chronic obstructive pulmonary disease, unspecified: Secondary | ICD-10-CM | POA: Diagnosis not present

## 2018-07-24 DIAGNOSIS — Z8673 Personal history of transient ischemic attack (TIA), and cerebral infarction without residual deficits: Secondary | ICD-10-CM | POA: Diagnosis not present

## 2018-07-28 DIAGNOSIS — R262 Difficulty in walking, not elsewhere classified: Secondary | ICD-10-CM | POA: Diagnosis not present

## 2018-07-28 DIAGNOSIS — E11319 Type 2 diabetes mellitus with unspecified diabetic retinopathy without macular edema: Secondary | ICD-10-CM | POA: Diagnosis not present

## 2018-07-28 DIAGNOSIS — Z7902 Long term (current) use of antithrombotics/antiplatelets: Secondary | ICD-10-CM | POA: Diagnosis not present

## 2018-07-28 DIAGNOSIS — I10 Essential (primary) hypertension: Secondary | ICD-10-CM | POA: Diagnosis not present

## 2018-07-28 DIAGNOSIS — J449 Chronic obstructive pulmonary disease, unspecified: Secondary | ICD-10-CM | POA: Diagnosis not present

## 2018-07-28 DIAGNOSIS — I679 Cerebrovascular disease, unspecified: Secondary | ICD-10-CM | POA: Diagnosis not present

## 2018-07-28 DIAGNOSIS — Z794 Long term (current) use of insulin: Secondary | ICD-10-CM | POA: Diagnosis not present

## 2018-07-28 DIAGNOSIS — Z8673 Personal history of transient ischemic attack (TIA), and cerebral infarction without residual deficits: Secondary | ICD-10-CM | POA: Diagnosis not present

## 2018-07-28 DIAGNOSIS — Z7982 Long term (current) use of aspirin: Secondary | ICD-10-CM | POA: Diagnosis not present

## 2018-07-28 DIAGNOSIS — G309 Alzheimer's disease, unspecified: Secondary | ICD-10-CM | POA: Diagnosis not present

## 2018-07-28 DIAGNOSIS — E785 Hyperlipidemia, unspecified: Secondary | ICD-10-CM | POA: Diagnosis not present

## 2018-07-29 DIAGNOSIS — I679 Cerebrovascular disease, unspecified: Secondary | ICD-10-CM | POA: Diagnosis not present

## 2018-07-29 DIAGNOSIS — Z8673 Personal history of transient ischemic attack (TIA), and cerebral infarction without residual deficits: Secondary | ICD-10-CM | POA: Diagnosis not present

## 2018-07-29 DIAGNOSIS — Z7902 Long term (current) use of antithrombotics/antiplatelets: Secondary | ICD-10-CM | POA: Diagnosis not present

## 2018-07-29 DIAGNOSIS — I10 Essential (primary) hypertension: Secondary | ICD-10-CM | POA: Diagnosis not present

## 2018-07-29 DIAGNOSIS — R262 Difficulty in walking, not elsewhere classified: Secondary | ICD-10-CM | POA: Diagnosis not present

## 2018-07-29 DIAGNOSIS — J449 Chronic obstructive pulmonary disease, unspecified: Secondary | ICD-10-CM | POA: Diagnosis not present

## 2018-07-29 DIAGNOSIS — E11319 Type 2 diabetes mellitus with unspecified diabetic retinopathy without macular edema: Secondary | ICD-10-CM | POA: Diagnosis not present

## 2018-07-29 DIAGNOSIS — Z7982 Long term (current) use of aspirin: Secondary | ICD-10-CM | POA: Diagnosis not present

## 2018-07-29 DIAGNOSIS — E785 Hyperlipidemia, unspecified: Secondary | ICD-10-CM | POA: Diagnosis not present

## 2018-07-29 DIAGNOSIS — Z794 Long term (current) use of insulin: Secondary | ICD-10-CM | POA: Diagnosis not present

## 2018-07-29 DIAGNOSIS — G309 Alzheimer's disease, unspecified: Secondary | ICD-10-CM | POA: Diagnosis not present

## 2018-07-31 DIAGNOSIS — G309 Alzheimer's disease, unspecified: Secondary | ICD-10-CM | POA: Diagnosis not present

## 2018-07-31 DIAGNOSIS — Z7982 Long term (current) use of aspirin: Secondary | ICD-10-CM | POA: Diagnosis not present

## 2018-07-31 DIAGNOSIS — J449 Chronic obstructive pulmonary disease, unspecified: Secondary | ICD-10-CM | POA: Diagnosis not present

## 2018-07-31 DIAGNOSIS — Z8673 Personal history of transient ischemic attack (TIA), and cerebral infarction without residual deficits: Secondary | ICD-10-CM | POA: Diagnosis not present

## 2018-07-31 DIAGNOSIS — Z7902 Long term (current) use of antithrombotics/antiplatelets: Secondary | ICD-10-CM | POA: Diagnosis not present

## 2018-07-31 DIAGNOSIS — E11319 Type 2 diabetes mellitus with unspecified diabetic retinopathy without macular edema: Secondary | ICD-10-CM | POA: Diagnosis not present

## 2018-07-31 DIAGNOSIS — R262 Difficulty in walking, not elsewhere classified: Secondary | ICD-10-CM | POA: Diagnosis not present

## 2018-07-31 DIAGNOSIS — Z794 Long term (current) use of insulin: Secondary | ICD-10-CM | POA: Diagnosis not present

## 2018-07-31 DIAGNOSIS — I679 Cerebrovascular disease, unspecified: Secondary | ICD-10-CM | POA: Diagnosis not present

## 2018-07-31 DIAGNOSIS — I10 Essential (primary) hypertension: Secondary | ICD-10-CM | POA: Diagnosis not present

## 2018-07-31 DIAGNOSIS — E785 Hyperlipidemia, unspecified: Secondary | ICD-10-CM | POA: Diagnosis not present

## 2018-08-03 DIAGNOSIS — G309 Alzheimer's disease, unspecified: Secondary | ICD-10-CM | POA: Diagnosis not present

## 2018-08-03 DIAGNOSIS — Z794 Long term (current) use of insulin: Secondary | ICD-10-CM | POA: Diagnosis not present

## 2018-08-03 DIAGNOSIS — J449 Chronic obstructive pulmonary disease, unspecified: Secondary | ICD-10-CM | POA: Diagnosis not present

## 2018-08-03 DIAGNOSIS — Z7902 Long term (current) use of antithrombotics/antiplatelets: Secondary | ICD-10-CM | POA: Diagnosis not present

## 2018-08-03 DIAGNOSIS — R262 Difficulty in walking, not elsewhere classified: Secondary | ICD-10-CM | POA: Diagnosis not present

## 2018-08-03 DIAGNOSIS — Z8673 Personal history of transient ischemic attack (TIA), and cerebral infarction without residual deficits: Secondary | ICD-10-CM | POA: Diagnosis not present

## 2018-08-03 DIAGNOSIS — E11319 Type 2 diabetes mellitus with unspecified diabetic retinopathy without macular edema: Secondary | ICD-10-CM | POA: Diagnosis not present

## 2018-08-03 DIAGNOSIS — I10 Essential (primary) hypertension: Secondary | ICD-10-CM | POA: Diagnosis not present

## 2018-08-03 DIAGNOSIS — Z7982 Long term (current) use of aspirin: Secondary | ICD-10-CM | POA: Diagnosis not present

## 2018-08-03 DIAGNOSIS — E785 Hyperlipidemia, unspecified: Secondary | ICD-10-CM | POA: Diagnosis not present

## 2018-08-03 DIAGNOSIS — I679 Cerebrovascular disease, unspecified: Secondary | ICD-10-CM | POA: Diagnosis not present

## 2018-08-04 DIAGNOSIS — E785 Hyperlipidemia, unspecified: Secondary | ICD-10-CM | POA: Diagnosis not present

## 2018-08-04 DIAGNOSIS — J449 Chronic obstructive pulmonary disease, unspecified: Secondary | ICD-10-CM | POA: Diagnosis not present

## 2018-08-04 DIAGNOSIS — Z7982 Long term (current) use of aspirin: Secondary | ICD-10-CM | POA: Diagnosis not present

## 2018-08-04 DIAGNOSIS — E11319 Type 2 diabetes mellitus with unspecified diabetic retinopathy without macular edema: Secondary | ICD-10-CM | POA: Diagnosis not present

## 2018-08-04 DIAGNOSIS — G309 Alzheimer's disease, unspecified: Secondary | ICD-10-CM | POA: Diagnosis not present

## 2018-08-04 DIAGNOSIS — Z794 Long term (current) use of insulin: Secondary | ICD-10-CM | POA: Diagnosis not present

## 2018-08-04 DIAGNOSIS — I10 Essential (primary) hypertension: Secondary | ICD-10-CM | POA: Diagnosis not present

## 2018-08-04 DIAGNOSIS — I679 Cerebrovascular disease, unspecified: Secondary | ICD-10-CM | POA: Diagnosis not present

## 2018-08-04 DIAGNOSIS — R262 Difficulty in walking, not elsewhere classified: Secondary | ICD-10-CM | POA: Diagnosis not present

## 2018-08-04 DIAGNOSIS — Z8673 Personal history of transient ischemic attack (TIA), and cerebral infarction without residual deficits: Secondary | ICD-10-CM | POA: Diagnosis not present

## 2018-08-04 DIAGNOSIS — Z7902 Long term (current) use of antithrombotics/antiplatelets: Secondary | ICD-10-CM | POA: Diagnosis not present

## 2018-08-06 DIAGNOSIS — R262 Difficulty in walking, not elsewhere classified: Secondary | ICD-10-CM | POA: Diagnosis not present

## 2018-08-06 DIAGNOSIS — J449 Chronic obstructive pulmonary disease, unspecified: Secondary | ICD-10-CM | POA: Diagnosis not present

## 2018-08-06 DIAGNOSIS — Z7902 Long term (current) use of antithrombotics/antiplatelets: Secondary | ICD-10-CM | POA: Diagnosis not present

## 2018-08-06 DIAGNOSIS — E785 Hyperlipidemia, unspecified: Secondary | ICD-10-CM | POA: Diagnosis not present

## 2018-08-06 DIAGNOSIS — G309 Alzheimer's disease, unspecified: Secondary | ICD-10-CM | POA: Diagnosis not present

## 2018-08-06 DIAGNOSIS — I679 Cerebrovascular disease, unspecified: Secondary | ICD-10-CM | POA: Diagnosis not present

## 2018-08-06 DIAGNOSIS — Z794 Long term (current) use of insulin: Secondary | ICD-10-CM | POA: Diagnosis not present

## 2018-08-06 DIAGNOSIS — I10 Essential (primary) hypertension: Secondary | ICD-10-CM | POA: Diagnosis not present

## 2018-08-06 DIAGNOSIS — E11319 Type 2 diabetes mellitus with unspecified diabetic retinopathy without macular edema: Secondary | ICD-10-CM | POA: Diagnosis not present

## 2018-08-06 DIAGNOSIS — Z7982 Long term (current) use of aspirin: Secondary | ICD-10-CM | POA: Diagnosis not present

## 2018-08-06 DIAGNOSIS — Z8673 Personal history of transient ischemic attack (TIA), and cerebral infarction without residual deficits: Secondary | ICD-10-CM | POA: Diagnosis not present

## 2018-08-07 DIAGNOSIS — E785 Hyperlipidemia, unspecified: Secondary | ICD-10-CM | POA: Diagnosis not present

## 2018-08-07 DIAGNOSIS — I679 Cerebrovascular disease, unspecified: Secondary | ICD-10-CM | POA: Diagnosis not present

## 2018-08-07 DIAGNOSIS — G309 Alzheimer's disease, unspecified: Secondary | ICD-10-CM | POA: Diagnosis not present

## 2018-08-07 DIAGNOSIS — Z794 Long term (current) use of insulin: Secondary | ICD-10-CM | POA: Diagnosis not present

## 2018-08-07 DIAGNOSIS — J449 Chronic obstructive pulmonary disease, unspecified: Secondary | ICD-10-CM | POA: Diagnosis not present

## 2018-08-07 DIAGNOSIS — Z7982 Long term (current) use of aspirin: Secondary | ICD-10-CM | POA: Diagnosis not present

## 2018-08-07 DIAGNOSIS — I10 Essential (primary) hypertension: Secondary | ICD-10-CM | POA: Diagnosis not present

## 2018-08-07 DIAGNOSIS — Z7902 Long term (current) use of antithrombotics/antiplatelets: Secondary | ICD-10-CM | POA: Diagnosis not present

## 2018-08-07 DIAGNOSIS — R262 Difficulty in walking, not elsewhere classified: Secondary | ICD-10-CM | POA: Diagnosis not present

## 2018-08-07 DIAGNOSIS — Z8673 Personal history of transient ischemic attack (TIA), and cerebral infarction without residual deficits: Secondary | ICD-10-CM | POA: Diagnosis not present

## 2018-08-07 DIAGNOSIS — E11319 Type 2 diabetes mellitus with unspecified diabetic retinopathy without macular edema: Secondary | ICD-10-CM | POA: Diagnosis not present

## 2018-08-12 DIAGNOSIS — I679 Cerebrovascular disease, unspecified: Secondary | ICD-10-CM | POA: Diagnosis not present

## 2018-08-12 DIAGNOSIS — Z794 Long term (current) use of insulin: Secondary | ICD-10-CM | POA: Diagnosis not present

## 2018-08-12 DIAGNOSIS — R262 Difficulty in walking, not elsewhere classified: Secondary | ICD-10-CM | POA: Diagnosis not present

## 2018-08-12 DIAGNOSIS — J449 Chronic obstructive pulmonary disease, unspecified: Secondary | ICD-10-CM | POA: Diagnosis not present

## 2018-08-12 DIAGNOSIS — E11319 Type 2 diabetes mellitus with unspecified diabetic retinopathy without macular edema: Secondary | ICD-10-CM | POA: Diagnosis not present

## 2018-08-12 DIAGNOSIS — Z7982 Long term (current) use of aspirin: Secondary | ICD-10-CM | POA: Diagnosis not present

## 2018-08-12 DIAGNOSIS — Z8673 Personal history of transient ischemic attack (TIA), and cerebral infarction without residual deficits: Secondary | ICD-10-CM | POA: Diagnosis not present

## 2018-08-12 DIAGNOSIS — I10 Essential (primary) hypertension: Secondary | ICD-10-CM | POA: Diagnosis not present

## 2018-08-12 DIAGNOSIS — G309 Alzheimer's disease, unspecified: Secondary | ICD-10-CM | POA: Diagnosis not present

## 2018-08-12 DIAGNOSIS — Z7902 Long term (current) use of antithrombotics/antiplatelets: Secondary | ICD-10-CM | POA: Diagnosis not present

## 2018-08-12 DIAGNOSIS — E785 Hyperlipidemia, unspecified: Secondary | ICD-10-CM | POA: Diagnosis not present

## 2018-08-14 DIAGNOSIS — G309 Alzheimer's disease, unspecified: Secondary | ICD-10-CM | POA: Diagnosis not present

## 2018-08-14 DIAGNOSIS — R262 Difficulty in walking, not elsewhere classified: Secondary | ICD-10-CM | POA: Diagnosis not present

## 2018-08-14 DIAGNOSIS — Z794 Long term (current) use of insulin: Secondary | ICD-10-CM | POA: Diagnosis not present

## 2018-08-14 DIAGNOSIS — Z7902 Long term (current) use of antithrombotics/antiplatelets: Secondary | ICD-10-CM | POA: Diagnosis not present

## 2018-08-14 DIAGNOSIS — E11319 Type 2 diabetes mellitus with unspecified diabetic retinopathy without macular edema: Secondary | ICD-10-CM | POA: Diagnosis not present

## 2018-08-14 DIAGNOSIS — Z7982 Long term (current) use of aspirin: Secondary | ICD-10-CM | POA: Diagnosis not present

## 2018-08-14 DIAGNOSIS — I10 Essential (primary) hypertension: Secondary | ICD-10-CM | POA: Diagnosis not present

## 2018-08-14 DIAGNOSIS — E785 Hyperlipidemia, unspecified: Secondary | ICD-10-CM | POA: Diagnosis not present

## 2018-08-14 DIAGNOSIS — J449 Chronic obstructive pulmonary disease, unspecified: Secondary | ICD-10-CM | POA: Diagnosis not present

## 2018-08-14 DIAGNOSIS — Z8673 Personal history of transient ischemic attack (TIA), and cerebral infarction without residual deficits: Secondary | ICD-10-CM | POA: Diagnosis not present

## 2018-08-14 DIAGNOSIS — I679 Cerebrovascular disease, unspecified: Secondary | ICD-10-CM | POA: Diagnosis not present

## 2018-08-18 DIAGNOSIS — J449 Chronic obstructive pulmonary disease, unspecified: Secondary | ICD-10-CM | POA: Diagnosis not present

## 2018-08-18 DIAGNOSIS — Z7902 Long term (current) use of antithrombotics/antiplatelets: Secondary | ICD-10-CM | POA: Diagnosis not present

## 2018-08-18 DIAGNOSIS — Z8673 Personal history of transient ischemic attack (TIA), and cerebral infarction without residual deficits: Secondary | ICD-10-CM | POA: Diagnosis not present

## 2018-08-18 DIAGNOSIS — E785 Hyperlipidemia, unspecified: Secondary | ICD-10-CM | POA: Diagnosis not present

## 2018-08-18 DIAGNOSIS — I10 Essential (primary) hypertension: Secondary | ICD-10-CM | POA: Diagnosis not present

## 2018-08-18 DIAGNOSIS — Z794 Long term (current) use of insulin: Secondary | ICD-10-CM | POA: Diagnosis not present

## 2018-08-18 DIAGNOSIS — I679 Cerebrovascular disease, unspecified: Secondary | ICD-10-CM | POA: Diagnosis not present

## 2018-08-18 DIAGNOSIS — E11319 Type 2 diabetes mellitus with unspecified diabetic retinopathy without macular edema: Secondary | ICD-10-CM | POA: Diagnosis not present

## 2018-08-18 DIAGNOSIS — Z7982 Long term (current) use of aspirin: Secondary | ICD-10-CM | POA: Diagnosis not present

## 2018-08-18 DIAGNOSIS — R262 Difficulty in walking, not elsewhere classified: Secondary | ICD-10-CM | POA: Diagnosis not present

## 2018-08-18 DIAGNOSIS — G309 Alzheimer's disease, unspecified: Secondary | ICD-10-CM | POA: Diagnosis not present

## 2018-08-19 DIAGNOSIS — E11319 Type 2 diabetes mellitus with unspecified diabetic retinopathy without macular edema: Secondary | ICD-10-CM | POA: Diagnosis not present

## 2018-08-19 DIAGNOSIS — Z7902 Long term (current) use of antithrombotics/antiplatelets: Secondary | ICD-10-CM | POA: Diagnosis not present

## 2018-08-19 DIAGNOSIS — Z794 Long term (current) use of insulin: Secondary | ICD-10-CM | POA: Diagnosis not present

## 2018-08-19 DIAGNOSIS — I679 Cerebrovascular disease, unspecified: Secondary | ICD-10-CM | POA: Diagnosis not present

## 2018-08-19 DIAGNOSIS — J449 Chronic obstructive pulmonary disease, unspecified: Secondary | ICD-10-CM | POA: Diagnosis not present

## 2018-08-19 DIAGNOSIS — R262 Difficulty in walking, not elsewhere classified: Secondary | ICD-10-CM | POA: Diagnosis not present

## 2018-08-19 DIAGNOSIS — G309 Alzheimer's disease, unspecified: Secondary | ICD-10-CM | POA: Diagnosis not present

## 2018-08-19 DIAGNOSIS — Z8673 Personal history of transient ischemic attack (TIA), and cerebral infarction without residual deficits: Secondary | ICD-10-CM | POA: Diagnosis not present

## 2018-08-19 DIAGNOSIS — I10 Essential (primary) hypertension: Secondary | ICD-10-CM | POA: Diagnosis not present

## 2018-08-19 DIAGNOSIS — E785 Hyperlipidemia, unspecified: Secondary | ICD-10-CM | POA: Diagnosis not present

## 2018-08-19 DIAGNOSIS — Z7982 Long term (current) use of aspirin: Secondary | ICD-10-CM | POA: Diagnosis not present

## 2018-08-20 DIAGNOSIS — Z794 Long term (current) use of insulin: Secondary | ICD-10-CM | POA: Diagnosis not present

## 2018-08-20 DIAGNOSIS — G309 Alzheimer's disease, unspecified: Secondary | ICD-10-CM | POA: Diagnosis not present

## 2018-08-20 DIAGNOSIS — Z8673 Personal history of transient ischemic attack (TIA), and cerebral infarction without residual deficits: Secondary | ICD-10-CM | POA: Diagnosis not present

## 2018-08-20 DIAGNOSIS — R262 Difficulty in walking, not elsewhere classified: Secondary | ICD-10-CM | POA: Diagnosis not present

## 2018-08-20 DIAGNOSIS — E785 Hyperlipidemia, unspecified: Secondary | ICD-10-CM | POA: Diagnosis not present

## 2018-08-20 DIAGNOSIS — Z7902 Long term (current) use of antithrombotics/antiplatelets: Secondary | ICD-10-CM | POA: Diagnosis not present

## 2018-08-20 DIAGNOSIS — J449 Chronic obstructive pulmonary disease, unspecified: Secondary | ICD-10-CM | POA: Diagnosis not present

## 2018-08-20 DIAGNOSIS — I679 Cerebrovascular disease, unspecified: Secondary | ICD-10-CM | POA: Diagnosis not present

## 2018-08-20 DIAGNOSIS — I10 Essential (primary) hypertension: Secondary | ICD-10-CM | POA: Diagnosis not present

## 2018-08-20 DIAGNOSIS — Z7982 Long term (current) use of aspirin: Secondary | ICD-10-CM | POA: Diagnosis not present

## 2018-08-20 DIAGNOSIS — E11319 Type 2 diabetes mellitus with unspecified diabetic retinopathy without macular edema: Secondary | ICD-10-CM | POA: Diagnosis not present

## 2018-08-21 DIAGNOSIS — Z7902 Long term (current) use of antithrombotics/antiplatelets: Secondary | ICD-10-CM | POA: Diagnosis not present

## 2018-08-21 DIAGNOSIS — I679 Cerebrovascular disease, unspecified: Secondary | ICD-10-CM | POA: Diagnosis not present

## 2018-08-21 DIAGNOSIS — E785 Hyperlipidemia, unspecified: Secondary | ICD-10-CM | POA: Diagnosis not present

## 2018-08-21 DIAGNOSIS — J449 Chronic obstructive pulmonary disease, unspecified: Secondary | ICD-10-CM | POA: Diagnosis not present

## 2018-08-21 DIAGNOSIS — Z794 Long term (current) use of insulin: Secondary | ICD-10-CM | POA: Diagnosis not present

## 2018-08-21 DIAGNOSIS — R262 Difficulty in walking, not elsewhere classified: Secondary | ICD-10-CM | POA: Diagnosis not present

## 2018-08-21 DIAGNOSIS — Z8673 Personal history of transient ischemic attack (TIA), and cerebral infarction without residual deficits: Secondary | ICD-10-CM | POA: Diagnosis not present

## 2018-08-21 DIAGNOSIS — E11319 Type 2 diabetes mellitus with unspecified diabetic retinopathy without macular edema: Secondary | ICD-10-CM | POA: Diagnosis not present

## 2018-08-21 DIAGNOSIS — G309 Alzheimer's disease, unspecified: Secondary | ICD-10-CM | POA: Diagnosis not present

## 2018-08-21 DIAGNOSIS — I10 Essential (primary) hypertension: Secondary | ICD-10-CM | POA: Diagnosis not present

## 2018-08-21 DIAGNOSIS — Z7982 Long term (current) use of aspirin: Secondary | ICD-10-CM | POA: Diagnosis not present

## 2018-08-23 DIAGNOSIS — Z7902 Long term (current) use of antithrombotics/antiplatelets: Secondary | ICD-10-CM | POA: Diagnosis not present

## 2018-08-23 DIAGNOSIS — R262 Difficulty in walking, not elsewhere classified: Secondary | ICD-10-CM | POA: Diagnosis not present

## 2018-08-23 DIAGNOSIS — Z8673 Personal history of transient ischemic attack (TIA), and cerebral infarction without residual deficits: Secondary | ICD-10-CM | POA: Diagnosis not present

## 2018-08-23 DIAGNOSIS — Z794 Long term (current) use of insulin: Secondary | ICD-10-CM | POA: Diagnosis not present

## 2018-08-23 DIAGNOSIS — I10 Essential (primary) hypertension: Secondary | ICD-10-CM | POA: Diagnosis not present

## 2018-08-23 DIAGNOSIS — E785 Hyperlipidemia, unspecified: Secondary | ICD-10-CM | POA: Diagnosis not present

## 2018-08-23 DIAGNOSIS — E11319 Type 2 diabetes mellitus with unspecified diabetic retinopathy without macular edema: Secondary | ICD-10-CM | POA: Diagnosis not present

## 2018-08-23 DIAGNOSIS — Z7982 Long term (current) use of aspirin: Secondary | ICD-10-CM | POA: Diagnosis not present

## 2018-08-23 DIAGNOSIS — J449 Chronic obstructive pulmonary disease, unspecified: Secondary | ICD-10-CM | POA: Diagnosis not present

## 2018-08-23 DIAGNOSIS — I679 Cerebrovascular disease, unspecified: Secondary | ICD-10-CM | POA: Diagnosis not present

## 2018-08-23 DIAGNOSIS — G309 Alzheimer's disease, unspecified: Secondary | ICD-10-CM | POA: Diagnosis not present

## 2018-08-26 DIAGNOSIS — E785 Hyperlipidemia, unspecified: Secondary | ICD-10-CM | POA: Diagnosis not present

## 2018-08-26 DIAGNOSIS — R262 Difficulty in walking, not elsewhere classified: Secondary | ICD-10-CM | POA: Diagnosis not present

## 2018-08-26 DIAGNOSIS — I10 Essential (primary) hypertension: Secondary | ICD-10-CM | POA: Diagnosis not present

## 2018-08-26 DIAGNOSIS — Z7982 Long term (current) use of aspirin: Secondary | ICD-10-CM | POA: Diagnosis not present

## 2018-08-26 DIAGNOSIS — Z7902 Long term (current) use of antithrombotics/antiplatelets: Secondary | ICD-10-CM | POA: Diagnosis not present

## 2018-08-26 DIAGNOSIS — E11319 Type 2 diabetes mellitus with unspecified diabetic retinopathy without macular edema: Secondary | ICD-10-CM | POA: Diagnosis not present

## 2018-08-26 DIAGNOSIS — Z8673 Personal history of transient ischemic attack (TIA), and cerebral infarction without residual deficits: Secondary | ICD-10-CM | POA: Diagnosis not present

## 2018-08-26 DIAGNOSIS — Z794 Long term (current) use of insulin: Secondary | ICD-10-CM | POA: Diagnosis not present

## 2018-08-26 DIAGNOSIS — J449 Chronic obstructive pulmonary disease, unspecified: Secondary | ICD-10-CM | POA: Diagnosis not present

## 2018-08-26 DIAGNOSIS — I679 Cerebrovascular disease, unspecified: Secondary | ICD-10-CM | POA: Diagnosis not present

## 2018-08-26 DIAGNOSIS — G309 Alzheimer's disease, unspecified: Secondary | ICD-10-CM | POA: Diagnosis not present

## 2018-09-01 DIAGNOSIS — E11319 Type 2 diabetes mellitus with unspecified diabetic retinopathy without macular edema: Secondary | ICD-10-CM | POA: Diagnosis not present

## 2018-09-01 DIAGNOSIS — E785 Hyperlipidemia, unspecified: Secondary | ICD-10-CM | POA: Diagnosis not present

## 2018-09-01 DIAGNOSIS — I10 Essential (primary) hypertension: Secondary | ICD-10-CM | POA: Diagnosis not present

## 2018-09-02 DIAGNOSIS — Z7982 Long term (current) use of aspirin: Secondary | ICD-10-CM | POA: Diagnosis not present

## 2018-09-02 DIAGNOSIS — E785 Hyperlipidemia, unspecified: Secondary | ICD-10-CM | POA: Diagnosis not present

## 2018-09-02 DIAGNOSIS — Z794 Long term (current) use of insulin: Secondary | ICD-10-CM | POA: Diagnosis not present

## 2018-09-02 DIAGNOSIS — I679 Cerebrovascular disease, unspecified: Secondary | ICD-10-CM | POA: Diagnosis not present

## 2018-09-02 DIAGNOSIS — Z8673 Personal history of transient ischemic attack (TIA), and cerebral infarction without residual deficits: Secondary | ICD-10-CM | POA: Diagnosis not present

## 2018-09-02 DIAGNOSIS — I10 Essential (primary) hypertension: Secondary | ICD-10-CM | POA: Diagnosis not present

## 2018-09-02 DIAGNOSIS — Z7902 Long term (current) use of antithrombotics/antiplatelets: Secondary | ICD-10-CM | POA: Diagnosis not present

## 2018-09-02 DIAGNOSIS — E11319 Type 2 diabetes mellitus with unspecified diabetic retinopathy without macular edema: Secondary | ICD-10-CM | POA: Diagnosis not present

## 2018-09-02 DIAGNOSIS — J449 Chronic obstructive pulmonary disease, unspecified: Secondary | ICD-10-CM | POA: Diagnosis not present

## 2018-09-02 DIAGNOSIS — G309 Alzheimer's disease, unspecified: Secondary | ICD-10-CM | POA: Diagnosis not present

## 2018-09-03 DIAGNOSIS — I679 Cerebrovascular disease, unspecified: Secondary | ICD-10-CM | POA: Diagnosis not present

## 2018-09-03 DIAGNOSIS — Z8673 Personal history of transient ischemic attack (TIA), and cerebral infarction without residual deficits: Secondary | ICD-10-CM | POA: Diagnosis not present

## 2018-09-03 DIAGNOSIS — Z794 Long term (current) use of insulin: Secondary | ICD-10-CM | POA: Diagnosis not present

## 2018-09-03 DIAGNOSIS — J449 Chronic obstructive pulmonary disease, unspecified: Secondary | ICD-10-CM | POA: Diagnosis not present

## 2018-09-03 DIAGNOSIS — E785 Hyperlipidemia, unspecified: Secondary | ICD-10-CM | POA: Diagnosis not present

## 2018-09-03 DIAGNOSIS — Z7902 Long term (current) use of antithrombotics/antiplatelets: Secondary | ICD-10-CM | POA: Diagnosis not present

## 2018-09-03 DIAGNOSIS — G309 Alzheimer's disease, unspecified: Secondary | ICD-10-CM | POA: Diagnosis not present

## 2018-09-03 DIAGNOSIS — I10 Essential (primary) hypertension: Secondary | ICD-10-CM | POA: Diagnosis not present

## 2018-09-03 DIAGNOSIS — Z7982 Long term (current) use of aspirin: Secondary | ICD-10-CM | POA: Diagnosis not present

## 2018-09-03 DIAGNOSIS — E11319 Type 2 diabetes mellitus with unspecified diabetic retinopathy without macular edema: Secondary | ICD-10-CM | POA: Diagnosis not present

## 2018-09-04 DIAGNOSIS — G309 Alzheimer's disease, unspecified: Secondary | ICD-10-CM | POA: Diagnosis not present

## 2018-09-04 DIAGNOSIS — I679 Cerebrovascular disease, unspecified: Secondary | ICD-10-CM | POA: Diagnosis not present

## 2018-09-04 DIAGNOSIS — E11319 Type 2 diabetes mellitus with unspecified diabetic retinopathy without macular edema: Secondary | ICD-10-CM | POA: Diagnosis not present

## 2018-09-04 DIAGNOSIS — Z7982 Long term (current) use of aspirin: Secondary | ICD-10-CM | POA: Diagnosis not present

## 2018-09-04 DIAGNOSIS — Z7902 Long term (current) use of antithrombotics/antiplatelets: Secondary | ICD-10-CM | POA: Diagnosis not present

## 2018-09-04 DIAGNOSIS — Z794 Long term (current) use of insulin: Secondary | ICD-10-CM | POA: Diagnosis not present

## 2018-09-04 DIAGNOSIS — E785 Hyperlipidemia, unspecified: Secondary | ICD-10-CM | POA: Diagnosis not present

## 2018-09-04 DIAGNOSIS — I10 Essential (primary) hypertension: Secondary | ICD-10-CM | POA: Diagnosis not present

## 2018-09-04 DIAGNOSIS — J449 Chronic obstructive pulmonary disease, unspecified: Secondary | ICD-10-CM | POA: Diagnosis not present

## 2018-09-04 DIAGNOSIS — Z8673 Personal history of transient ischemic attack (TIA), and cerebral infarction without residual deficits: Secondary | ICD-10-CM | POA: Diagnosis not present

## 2018-09-07 DIAGNOSIS — J449 Chronic obstructive pulmonary disease, unspecified: Secondary | ICD-10-CM | POA: Diagnosis not present

## 2018-09-07 DIAGNOSIS — G309 Alzheimer's disease, unspecified: Secondary | ICD-10-CM | POA: Diagnosis not present

## 2018-09-07 DIAGNOSIS — Z794 Long term (current) use of insulin: Secondary | ICD-10-CM | POA: Diagnosis not present

## 2018-09-07 DIAGNOSIS — E11319 Type 2 diabetes mellitus with unspecified diabetic retinopathy without macular edema: Secondary | ICD-10-CM | POA: Diagnosis not present

## 2018-09-07 DIAGNOSIS — I10 Essential (primary) hypertension: Secondary | ICD-10-CM | POA: Diagnosis not present

## 2018-09-07 DIAGNOSIS — I679 Cerebrovascular disease, unspecified: Secondary | ICD-10-CM | POA: Diagnosis not present

## 2018-09-07 DIAGNOSIS — Z8673 Personal history of transient ischemic attack (TIA), and cerebral infarction without residual deficits: Secondary | ICD-10-CM | POA: Diagnosis not present

## 2018-09-07 DIAGNOSIS — E785 Hyperlipidemia, unspecified: Secondary | ICD-10-CM | POA: Diagnosis not present

## 2018-09-07 DIAGNOSIS — Z7902 Long term (current) use of antithrombotics/antiplatelets: Secondary | ICD-10-CM | POA: Diagnosis not present

## 2018-09-07 DIAGNOSIS — Z7982 Long term (current) use of aspirin: Secondary | ICD-10-CM | POA: Diagnosis not present

## 2018-09-10 DIAGNOSIS — Z7982 Long term (current) use of aspirin: Secondary | ICD-10-CM | POA: Diagnosis not present

## 2018-09-10 DIAGNOSIS — I10 Essential (primary) hypertension: Secondary | ICD-10-CM | POA: Diagnosis not present

## 2018-09-10 DIAGNOSIS — E785 Hyperlipidemia, unspecified: Secondary | ICD-10-CM | POA: Diagnosis not present

## 2018-09-10 DIAGNOSIS — G309 Alzheimer's disease, unspecified: Secondary | ICD-10-CM | POA: Diagnosis not present

## 2018-09-10 DIAGNOSIS — E11319 Type 2 diabetes mellitus with unspecified diabetic retinopathy without macular edema: Secondary | ICD-10-CM | POA: Diagnosis not present

## 2018-09-10 DIAGNOSIS — Z7902 Long term (current) use of antithrombotics/antiplatelets: Secondary | ICD-10-CM | POA: Diagnosis not present

## 2018-09-10 DIAGNOSIS — Z794 Long term (current) use of insulin: Secondary | ICD-10-CM | POA: Diagnosis not present

## 2018-09-10 DIAGNOSIS — J449 Chronic obstructive pulmonary disease, unspecified: Secondary | ICD-10-CM | POA: Diagnosis not present

## 2018-09-10 DIAGNOSIS — I679 Cerebrovascular disease, unspecified: Secondary | ICD-10-CM | POA: Diagnosis not present

## 2018-09-10 DIAGNOSIS — Z8673 Personal history of transient ischemic attack (TIA), and cerebral infarction without residual deficits: Secondary | ICD-10-CM | POA: Diagnosis not present

## 2018-09-11 DIAGNOSIS — E11319 Type 2 diabetes mellitus with unspecified diabetic retinopathy without macular edema: Secondary | ICD-10-CM | POA: Diagnosis not present

## 2018-09-11 DIAGNOSIS — Z7902 Long term (current) use of antithrombotics/antiplatelets: Secondary | ICD-10-CM | POA: Diagnosis not present

## 2018-09-11 DIAGNOSIS — Z794 Long term (current) use of insulin: Secondary | ICD-10-CM | POA: Diagnosis not present

## 2018-09-11 DIAGNOSIS — J449 Chronic obstructive pulmonary disease, unspecified: Secondary | ICD-10-CM | POA: Diagnosis not present

## 2018-09-11 DIAGNOSIS — E785 Hyperlipidemia, unspecified: Secondary | ICD-10-CM | POA: Diagnosis not present

## 2018-09-11 DIAGNOSIS — I10 Essential (primary) hypertension: Secondary | ICD-10-CM | POA: Diagnosis not present

## 2018-09-11 DIAGNOSIS — G309 Alzheimer's disease, unspecified: Secondary | ICD-10-CM | POA: Diagnosis not present

## 2018-09-11 DIAGNOSIS — Z8673 Personal history of transient ischemic attack (TIA), and cerebral infarction without residual deficits: Secondary | ICD-10-CM | POA: Diagnosis not present

## 2018-09-11 DIAGNOSIS — Z7982 Long term (current) use of aspirin: Secondary | ICD-10-CM | POA: Diagnosis not present

## 2018-09-11 DIAGNOSIS — I679 Cerebrovascular disease, unspecified: Secondary | ICD-10-CM | POA: Diagnosis not present

## 2018-09-15 DIAGNOSIS — J449 Chronic obstructive pulmonary disease, unspecified: Secondary | ICD-10-CM | POA: Diagnosis not present

## 2018-09-15 DIAGNOSIS — Z7982 Long term (current) use of aspirin: Secondary | ICD-10-CM | POA: Diagnosis not present

## 2018-09-15 DIAGNOSIS — I10 Essential (primary) hypertension: Secondary | ICD-10-CM | POA: Diagnosis not present

## 2018-09-15 DIAGNOSIS — I679 Cerebrovascular disease, unspecified: Secondary | ICD-10-CM | POA: Diagnosis not present

## 2018-09-15 DIAGNOSIS — G309 Alzheimer's disease, unspecified: Secondary | ICD-10-CM | POA: Diagnosis not present

## 2018-09-15 DIAGNOSIS — Z7902 Long term (current) use of antithrombotics/antiplatelets: Secondary | ICD-10-CM | POA: Diagnosis not present

## 2018-09-15 DIAGNOSIS — E785 Hyperlipidemia, unspecified: Secondary | ICD-10-CM | POA: Diagnosis not present

## 2018-09-15 DIAGNOSIS — E11319 Type 2 diabetes mellitus with unspecified diabetic retinopathy without macular edema: Secondary | ICD-10-CM | POA: Diagnosis not present

## 2018-09-15 DIAGNOSIS — Z794 Long term (current) use of insulin: Secondary | ICD-10-CM | POA: Diagnosis not present

## 2018-09-15 DIAGNOSIS — Z8673 Personal history of transient ischemic attack (TIA), and cerebral infarction without residual deficits: Secondary | ICD-10-CM | POA: Diagnosis not present

## 2018-09-16 DIAGNOSIS — E038 Other specified hypothyroidism: Secondary | ICD-10-CM | POA: Diagnosis not present

## 2018-09-16 DIAGNOSIS — Z79899 Other long term (current) drug therapy: Secondary | ICD-10-CM | POA: Diagnosis not present

## 2018-09-16 DIAGNOSIS — D518 Other vitamin B12 deficiency anemias: Secondary | ICD-10-CM | POA: Diagnosis not present

## 2018-09-16 DIAGNOSIS — E119 Type 2 diabetes mellitus without complications: Secondary | ICD-10-CM | POA: Diagnosis not present

## 2018-09-16 DIAGNOSIS — E7849 Other hyperlipidemia: Secondary | ICD-10-CM | POA: Diagnosis not present

## 2018-09-16 DIAGNOSIS — E559 Vitamin D deficiency, unspecified: Secondary | ICD-10-CM | POA: Diagnosis not present

## 2018-09-17 DIAGNOSIS — Z7902 Long term (current) use of antithrombotics/antiplatelets: Secondary | ICD-10-CM | POA: Diagnosis not present

## 2018-09-17 DIAGNOSIS — E785 Hyperlipidemia, unspecified: Secondary | ICD-10-CM | POA: Diagnosis not present

## 2018-09-17 DIAGNOSIS — I10 Essential (primary) hypertension: Secondary | ICD-10-CM | POA: Diagnosis not present

## 2018-09-17 DIAGNOSIS — Z794 Long term (current) use of insulin: Secondary | ICD-10-CM | POA: Diagnosis not present

## 2018-09-17 DIAGNOSIS — Z8673 Personal history of transient ischemic attack (TIA), and cerebral infarction without residual deficits: Secondary | ICD-10-CM | POA: Diagnosis not present

## 2018-09-17 DIAGNOSIS — G309 Alzheimer's disease, unspecified: Secondary | ICD-10-CM | POA: Diagnosis not present

## 2018-09-17 DIAGNOSIS — Z7982 Long term (current) use of aspirin: Secondary | ICD-10-CM | POA: Diagnosis not present

## 2018-09-17 DIAGNOSIS — J449 Chronic obstructive pulmonary disease, unspecified: Secondary | ICD-10-CM | POA: Diagnosis not present

## 2018-09-17 DIAGNOSIS — E11319 Type 2 diabetes mellitus with unspecified diabetic retinopathy without macular edema: Secondary | ICD-10-CM | POA: Diagnosis not present

## 2018-09-17 DIAGNOSIS — I679 Cerebrovascular disease, unspecified: Secondary | ICD-10-CM | POA: Diagnosis not present

## 2018-09-25 DIAGNOSIS — I679 Cerebrovascular disease, unspecified: Secondary | ICD-10-CM | POA: Diagnosis not present

## 2018-09-25 DIAGNOSIS — Z7902 Long term (current) use of antithrombotics/antiplatelets: Secondary | ICD-10-CM | POA: Diagnosis not present

## 2018-09-25 DIAGNOSIS — G309 Alzheimer's disease, unspecified: Secondary | ICD-10-CM | POA: Diagnosis not present

## 2018-09-25 DIAGNOSIS — Z8673 Personal history of transient ischemic attack (TIA), and cerebral infarction without residual deficits: Secondary | ICD-10-CM | POA: Diagnosis not present

## 2018-09-25 DIAGNOSIS — I10 Essential (primary) hypertension: Secondary | ICD-10-CM | POA: Diagnosis not present

## 2018-09-25 DIAGNOSIS — Z7982 Long term (current) use of aspirin: Secondary | ICD-10-CM | POA: Diagnosis not present

## 2018-09-25 DIAGNOSIS — J449 Chronic obstructive pulmonary disease, unspecified: Secondary | ICD-10-CM | POA: Diagnosis not present

## 2018-09-25 DIAGNOSIS — E11319 Type 2 diabetes mellitus with unspecified diabetic retinopathy without macular edema: Secondary | ICD-10-CM | POA: Diagnosis not present

## 2018-09-25 DIAGNOSIS — Z794 Long term (current) use of insulin: Secondary | ICD-10-CM | POA: Diagnosis not present

## 2018-09-25 DIAGNOSIS — E785 Hyperlipidemia, unspecified: Secondary | ICD-10-CM | POA: Diagnosis not present

## 2018-09-29 DIAGNOSIS — I10 Essential (primary) hypertension: Secondary | ICD-10-CM | POA: Diagnosis not present

## 2018-09-29 DIAGNOSIS — E785 Hyperlipidemia, unspecified: Secondary | ICD-10-CM | POA: Diagnosis not present

## 2018-09-29 DIAGNOSIS — G309 Alzheimer's disease, unspecified: Secondary | ICD-10-CM | POA: Diagnosis not present

## 2018-09-29 DIAGNOSIS — J449 Chronic obstructive pulmonary disease, unspecified: Secondary | ICD-10-CM | POA: Diagnosis not present

## 2018-10-01 DIAGNOSIS — G309 Alzheimer's disease, unspecified: Secondary | ICD-10-CM | POA: Diagnosis not present

## 2018-10-01 DIAGNOSIS — I10 Essential (primary) hypertension: Secondary | ICD-10-CM | POA: Diagnosis not present

## 2018-10-02 DIAGNOSIS — I10 Essential (primary) hypertension: Secondary | ICD-10-CM | POA: Diagnosis not present

## 2018-10-02 DIAGNOSIS — I679 Cerebrovascular disease, unspecified: Secondary | ICD-10-CM | POA: Diagnosis not present

## 2018-10-02 DIAGNOSIS — J449 Chronic obstructive pulmonary disease, unspecified: Secondary | ICD-10-CM | POA: Diagnosis not present

## 2018-10-02 DIAGNOSIS — Z7902 Long term (current) use of antithrombotics/antiplatelets: Secondary | ICD-10-CM | POA: Diagnosis not present

## 2018-10-02 DIAGNOSIS — G309 Alzheimer's disease, unspecified: Secondary | ICD-10-CM | POA: Diagnosis not present

## 2018-10-02 DIAGNOSIS — Z7982 Long term (current) use of aspirin: Secondary | ICD-10-CM | POA: Diagnosis not present

## 2018-10-02 DIAGNOSIS — E785 Hyperlipidemia, unspecified: Secondary | ICD-10-CM | POA: Diagnosis not present

## 2018-10-02 DIAGNOSIS — E11319 Type 2 diabetes mellitus with unspecified diabetic retinopathy without macular edema: Secondary | ICD-10-CM | POA: Diagnosis not present

## 2018-10-02 DIAGNOSIS — Z8673 Personal history of transient ischemic attack (TIA), and cerebral infarction without residual deficits: Secondary | ICD-10-CM | POA: Diagnosis not present

## 2018-10-02 DIAGNOSIS — Z794 Long term (current) use of insulin: Secondary | ICD-10-CM | POA: Diagnosis not present

## 2018-10-06 DIAGNOSIS — G309 Alzheimer's disease, unspecified: Secondary | ICD-10-CM | POA: Diagnosis not present

## 2018-10-06 DIAGNOSIS — Z7902 Long term (current) use of antithrombotics/antiplatelets: Secondary | ICD-10-CM | POA: Diagnosis not present

## 2018-10-06 DIAGNOSIS — E119 Type 2 diabetes mellitus without complications: Secondary | ICD-10-CM | POA: Diagnosis not present

## 2018-10-06 DIAGNOSIS — Z7982 Long term (current) use of aspirin: Secondary | ICD-10-CM | POA: Diagnosis not present

## 2018-10-06 DIAGNOSIS — Z8673 Personal history of transient ischemic attack (TIA), and cerebral infarction without residual deficits: Secondary | ICD-10-CM | POA: Diagnosis not present

## 2018-10-06 DIAGNOSIS — I10 Essential (primary) hypertension: Secondary | ICD-10-CM | POA: Diagnosis not present

## 2018-10-06 DIAGNOSIS — E785 Hyperlipidemia, unspecified: Secondary | ICD-10-CM | POA: Diagnosis not present

## 2018-10-06 DIAGNOSIS — H4321 Crystalline deposits in vitreous body, right eye: Secondary | ICD-10-CM | POA: Diagnosis not present

## 2018-10-06 DIAGNOSIS — E11319 Type 2 diabetes mellitus with unspecified diabetic retinopathy without macular edema: Secondary | ICD-10-CM | POA: Diagnosis not present

## 2018-10-06 DIAGNOSIS — H2513 Age-related nuclear cataract, bilateral: Secondary | ICD-10-CM | POA: Diagnosis not present

## 2018-10-06 DIAGNOSIS — J449 Chronic obstructive pulmonary disease, unspecified: Secondary | ICD-10-CM | POA: Diagnosis not present

## 2018-10-06 DIAGNOSIS — Z794 Long term (current) use of insulin: Secondary | ICD-10-CM | POA: Diagnosis not present

## 2018-10-06 DIAGNOSIS — I679 Cerebrovascular disease, unspecified: Secondary | ICD-10-CM | POA: Diagnosis not present

## 2018-10-08 DIAGNOSIS — E11319 Type 2 diabetes mellitus with unspecified diabetic retinopathy without macular edema: Secondary | ICD-10-CM | POA: Diagnosis not present

## 2018-10-08 DIAGNOSIS — E785 Hyperlipidemia, unspecified: Secondary | ICD-10-CM | POA: Diagnosis not present

## 2018-10-08 DIAGNOSIS — I10 Essential (primary) hypertension: Secondary | ICD-10-CM | POA: Diagnosis not present

## 2018-10-08 DIAGNOSIS — Z794 Long term (current) use of insulin: Secondary | ICD-10-CM | POA: Diagnosis not present

## 2018-10-08 DIAGNOSIS — I679 Cerebrovascular disease, unspecified: Secondary | ICD-10-CM | POA: Diagnosis not present

## 2018-10-08 DIAGNOSIS — Z7982 Long term (current) use of aspirin: Secondary | ICD-10-CM | POA: Diagnosis not present

## 2018-10-08 DIAGNOSIS — Z7902 Long term (current) use of antithrombotics/antiplatelets: Secondary | ICD-10-CM | POA: Diagnosis not present

## 2018-10-08 DIAGNOSIS — G309 Alzheimer's disease, unspecified: Secondary | ICD-10-CM | POA: Diagnosis not present

## 2018-10-08 DIAGNOSIS — Z8673 Personal history of transient ischemic attack (TIA), and cerebral infarction without residual deficits: Secondary | ICD-10-CM | POA: Diagnosis not present

## 2018-10-08 DIAGNOSIS — J449 Chronic obstructive pulmonary disease, unspecified: Secondary | ICD-10-CM | POA: Diagnosis not present

## 2018-10-09 DIAGNOSIS — J449 Chronic obstructive pulmonary disease, unspecified: Secondary | ICD-10-CM | POA: Diagnosis not present

## 2018-10-09 DIAGNOSIS — I679 Cerebrovascular disease, unspecified: Secondary | ICD-10-CM | POA: Diagnosis not present

## 2018-10-09 DIAGNOSIS — Z8673 Personal history of transient ischemic attack (TIA), and cerebral infarction without residual deficits: Secondary | ICD-10-CM | POA: Diagnosis not present

## 2018-10-09 DIAGNOSIS — G309 Alzheimer's disease, unspecified: Secondary | ICD-10-CM | POA: Diagnosis not present

## 2018-10-09 DIAGNOSIS — E785 Hyperlipidemia, unspecified: Secondary | ICD-10-CM | POA: Diagnosis not present

## 2018-10-09 DIAGNOSIS — E11319 Type 2 diabetes mellitus with unspecified diabetic retinopathy without macular edema: Secondary | ICD-10-CM | POA: Diagnosis not present

## 2018-10-09 DIAGNOSIS — Z7902 Long term (current) use of antithrombotics/antiplatelets: Secondary | ICD-10-CM | POA: Diagnosis not present

## 2018-10-09 DIAGNOSIS — Z794 Long term (current) use of insulin: Secondary | ICD-10-CM | POA: Diagnosis not present

## 2018-10-09 DIAGNOSIS — I10 Essential (primary) hypertension: Secondary | ICD-10-CM | POA: Diagnosis not present

## 2018-10-09 DIAGNOSIS — Z7982 Long term (current) use of aspirin: Secondary | ICD-10-CM | POA: Diagnosis not present

## 2018-10-12 DIAGNOSIS — Z7902 Long term (current) use of antithrombotics/antiplatelets: Secondary | ICD-10-CM | POA: Diagnosis not present

## 2018-10-12 DIAGNOSIS — J449 Chronic obstructive pulmonary disease, unspecified: Secondary | ICD-10-CM | POA: Diagnosis not present

## 2018-10-12 DIAGNOSIS — Z794 Long term (current) use of insulin: Secondary | ICD-10-CM | POA: Diagnosis not present

## 2018-10-12 DIAGNOSIS — I679 Cerebrovascular disease, unspecified: Secondary | ICD-10-CM | POA: Diagnosis not present

## 2018-10-12 DIAGNOSIS — Z8673 Personal history of transient ischemic attack (TIA), and cerebral infarction without residual deficits: Secondary | ICD-10-CM | POA: Diagnosis not present

## 2018-10-12 DIAGNOSIS — G309 Alzheimer's disease, unspecified: Secondary | ICD-10-CM | POA: Diagnosis not present

## 2018-10-12 DIAGNOSIS — E785 Hyperlipidemia, unspecified: Secondary | ICD-10-CM | POA: Diagnosis not present

## 2018-10-12 DIAGNOSIS — E11319 Type 2 diabetes mellitus with unspecified diabetic retinopathy without macular edema: Secondary | ICD-10-CM | POA: Diagnosis not present

## 2018-10-12 DIAGNOSIS — Z7982 Long term (current) use of aspirin: Secondary | ICD-10-CM | POA: Diagnosis not present

## 2018-10-12 DIAGNOSIS — I10 Essential (primary) hypertension: Secondary | ICD-10-CM | POA: Diagnosis not present

## 2018-10-16 DIAGNOSIS — E11319 Type 2 diabetes mellitus with unspecified diabetic retinopathy without macular edema: Secondary | ICD-10-CM | POA: Diagnosis not present

## 2018-10-16 DIAGNOSIS — Z8673 Personal history of transient ischemic attack (TIA), and cerebral infarction without residual deficits: Secondary | ICD-10-CM | POA: Diagnosis not present

## 2018-10-16 DIAGNOSIS — J449 Chronic obstructive pulmonary disease, unspecified: Secondary | ICD-10-CM | POA: Diagnosis not present

## 2018-10-16 DIAGNOSIS — I10 Essential (primary) hypertension: Secondary | ICD-10-CM | POA: Diagnosis not present

## 2018-10-16 DIAGNOSIS — G309 Alzheimer's disease, unspecified: Secondary | ICD-10-CM | POA: Diagnosis not present

## 2018-10-16 DIAGNOSIS — Z794 Long term (current) use of insulin: Secondary | ICD-10-CM | POA: Diagnosis not present

## 2018-10-16 DIAGNOSIS — I679 Cerebrovascular disease, unspecified: Secondary | ICD-10-CM | POA: Diagnosis not present

## 2018-10-16 DIAGNOSIS — E785 Hyperlipidemia, unspecified: Secondary | ICD-10-CM | POA: Diagnosis not present

## 2018-10-16 DIAGNOSIS — Z7982 Long term (current) use of aspirin: Secondary | ICD-10-CM | POA: Diagnosis not present

## 2018-10-16 DIAGNOSIS — Z7902 Long term (current) use of antithrombotics/antiplatelets: Secondary | ICD-10-CM | POA: Diagnosis not present

## 2018-10-19 DIAGNOSIS — E785 Hyperlipidemia, unspecified: Secondary | ICD-10-CM | POA: Diagnosis not present

## 2018-10-19 DIAGNOSIS — I10 Essential (primary) hypertension: Secondary | ICD-10-CM | POA: Diagnosis not present

## 2018-10-19 DIAGNOSIS — I679 Cerebrovascular disease, unspecified: Secondary | ICD-10-CM | POA: Diagnosis not present

## 2018-10-19 DIAGNOSIS — J449 Chronic obstructive pulmonary disease, unspecified: Secondary | ICD-10-CM | POA: Diagnosis not present

## 2018-10-19 DIAGNOSIS — Z8673 Personal history of transient ischemic attack (TIA), and cerebral infarction without residual deficits: Secondary | ICD-10-CM | POA: Diagnosis not present

## 2018-10-19 DIAGNOSIS — E11319 Type 2 diabetes mellitus with unspecified diabetic retinopathy without macular edema: Secondary | ICD-10-CM | POA: Diagnosis not present

## 2018-10-19 DIAGNOSIS — Z7902 Long term (current) use of antithrombotics/antiplatelets: Secondary | ICD-10-CM | POA: Diagnosis not present

## 2018-10-19 DIAGNOSIS — Z794 Long term (current) use of insulin: Secondary | ICD-10-CM | POA: Diagnosis not present

## 2018-10-19 DIAGNOSIS — G309 Alzheimer's disease, unspecified: Secondary | ICD-10-CM | POA: Diagnosis not present

## 2018-10-19 DIAGNOSIS — Z7982 Long term (current) use of aspirin: Secondary | ICD-10-CM | POA: Diagnosis not present

## 2018-10-22 DIAGNOSIS — I679 Cerebrovascular disease, unspecified: Secondary | ICD-10-CM | POA: Diagnosis not present

## 2018-10-22 DIAGNOSIS — I10 Essential (primary) hypertension: Secondary | ICD-10-CM | POA: Diagnosis not present

## 2018-10-22 DIAGNOSIS — Z794 Long term (current) use of insulin: Secondary | ICD-10-CM | POA: Diagnosis not present

## 2018-10-22 DIAGNOSIS — G309 Alzheimer's disease, unspecified: Secondary | ICD-10-CM | POA: Diagnosis not present

## 2018-10-22 DIAGNOSIS — Z7982 Long term (current) use of aspirin: Secondary | ICD-10-CM | POA: Diagnosis not present

## 2018-10-22 DIAGNOSIS — Z8673 Personal history of transient ischemic attack (TIA), and cerebral infarction without residual deficits: Secondary | ICD-10-CM | POA: Diagnosis not present

## 2018-10-22 DIAGNOSIS — Z7902 Long term (current) use of antithrombotics/antiplatelets: Secondary | ICD-10-CM | POA: Diagnosis not present

## 2018-10-22 DIAGNOSIS — E785 Hyperlipidemia, unspecified: Secondary | ICD-10-CM | POA: Diagnosis not present

## 2018-10-22 DIAGNOSIS — J449 Chronic obstructive pulmonary disease, unspecified: Secondary | ICD-10-CM | POA: Diagnosis not present

## 2018-10-22 DIAGNOSIS — E11319 Type 2 diabetes mellitus with unspecified diabetic retinopathy without macular edema: Secondary | ICD-10-CM | POA: Diagnosis not present

## 2018-10-23 DIAGNOSIS — Z7982 Long term (current) use of aspirin: Secondary | ICD-10-CM | POA: Diagnosis not present

## 2018-10-23 DIAGNOSIS — G309 Alzheimer's disease, unspecified: Secondary | ICD-10-CM | POA: Diagnosis not present

## 2018-10-23 DIAGNOSIS — I10 Essential (primary) hypertension: Secondary | ICD-10-CM | POA: Diagnosis not present

## 2018-10-23 DIAGNOSIS — Z8673 Personal history of transient ischemic attack (TIA), and cerebral infarction without residual deficits: Secondary | ICD-10-CM | POA: Diagnosis not present

## 2018-10-23 DIAGNOSIS — E785 Hyperlipidemia, unspecified: Secondary | ICD-10-CM | POA: Diagnosis not present

## 2018-10-23 DIAGNOSIS — Z794 Long term (current) use of insulin: Secondary | ICD-10-CM | POA: Diagnosis not present

## 2018-10-23 DIAGNOSIS — J449 Chronic obstructive pulmonary disease, unspecified: Secondary | ICD-10-CM | POA: Diagnosis not present

## 2018-10-23 DIAGNOSIS — E11319 Type 2 diabetes mellitus with unspecified diabetic retinopathy without macular edema: Secondary | ICD-10-CM | POA: Diagnosis not present

## 2018-10-23 DIAGNOSIS — Z7902 Long term (current) use of antithrombotics/antiplatelets: Secondary | ICD-10-CM | POA: Diagnosis not present

## 2018-10-23 DIAGNOSIS — I679 Cerebrovascular disease, unspecified: Secondary | ICD-10-CM | POA: Diagnosis not present

## 2018-10-27 DIAGNOSIS — G309 Alzheimer's disease, unspecified: Secondary | ICD-10-CM | POA: Diagnosis not present

## 2018-10-27 DIAGNOSIS — I679 Cerebrovascular disease, unspecified: Secondary | ICD-10-CM | POA: Diagnosis not present

## 2018-10-27 DIAGNOSIS — E785 Hyperlipidemia, unspecified: Secondary | ICD-10-CM | POA: Diagnosis not present

## 2018-10-27 DIAGNOSIS — Z7902 Long term (current) use of antithrombotics/antiplatelets: Secondary | ICD-10-CM | POA: Diagnosis not present

## 2018-10-27 DIAGNOSIS — Z794 Long term (current) use of insulin: Secondary | ICD-10-CM | POA: Diagnosis not present

## 2018-10-27 DIAGNOSIS — Z7982 Long term (current) use of aspirin: Secondary | ICD-10-CM | POA: Diagnosis not present

## 2018-10-27 DIAGNOSIS — J449 Chronic obstructive pulmonary disease, unspecified: Secondary | ICD-10-CM | POA: Diagnosis not present

## 2018-10-27 DIAGNOSIS — Z8673 Personal history of transient ischemic attack (TIA), and cerebral infarction without residual deficits: Secondary | ICD-10-CM | POA: Diagnosis not present

## 2018-10-27 DIAGNOSIS — I10 Essential (primary) hypertension: Secondary | ICD-10-CM | POA: Diagnosis not present

## 2018-10-27 DIAGNOSIS — E11319 Type 2 diabetes mellitus with unspecified diabetic retinopathy without macular edema: Secondary | ICD-10-CM | POA: Diagnosis not present

## 2018-10-29 DIAGNOSIS — Z7982 Long term (current) use of aspirin: Secondary | ICD-10-CM | POA: Diagnosis not present

## 2018-10-29 DIAGNOSIS — Z794 Long term (current) use of insulin: Secondary | ICD-10-CM | POA: Diagnosis not present

## 2018-10-29 DIAGNOSIS — E785 Hyperlipidemia, unspecified: Secondary | ICD-10-CM | POA: Diagnosis not present

## 2018-10-29 DIAGNOSIS — G309 Alzheimer's disease, unspecified: Secondary | ICD-10-CM | POA: Diagnosis not present

## 2018-10-29 DIAGNOSIS — E11319 Type 2 diabetes mellitus with unspecified diabetic retinopathy without macular edema: Secondary | ICD-10-CM | POA: Diagnosis not present

## 2018-10-29 DIAGNOSIS — J449 Chronic obstructive pulmonary disease, unspecified: Secondary | ICD-10-CM | POA: Diagnosis not present

## 2018-10-29 DIAGNOSIS — I679 Cerebrovascular disease, unspecified: Secondary | ICD-10-CM | POA: Diagnosis not present

## 2018-10-29 DIAGNOSIS — Z8673 Personal history of transient ischemic attack (TIA), and cerebral infarction without residual deficits: Secondary | ICD-10-CM | POA: Diagnosis not present

## 2018-10-29 DIAGNOSIS — I10 Essential (primary) hypertension: Secondary | ICD-10-CM | POA: Diagnosis not present

## 2018-10-29 DIAGNOSIS — Z7902 Long term (current) use of antithrombotics/antiplatelets: Secondary | ICD-10-CM | POA: Diagnosis not present

## 2018-10-30 DIAGNOSIS — J449 Chronic obstructive pulmonary disease, unspecified: Secondary | ICD-10-CM | POA: Diagnosis not present

## 2018-10-30 DIAGNOSIS — Z7982 Long term (current) use of aspirin: Secondary | ICD-10-CM | POA: Diagnosis not present

## 2018-10-30 DIAGNOSIS — I10 Essential (primary) hypertension: Secondary | ICD-10-CM | POA: Diagnosis not present

## 2018-10-30 DIAGNOSIS — E785 Hyperlipidemia, unspecified: Secondary | ICD-10-CM | POA: Diagnosis not present

## 2018-10-30 DIAGNOSIS — I679 Cerebrovascular disease, unspecified: Secondary | ICD-10-CM | POA: Diagnosis not present

## 2018-10-30 DIAGNOSIS — G309 Alzheimer's disease, unspecified: Secondary | ICD-10-CM | POA: Diagnosis not present

## 2018-10-30 DIAGNOSIS — Z794 Long term (current) use of insulin: Secondary | ICD-10-CM | POA: Diagnosis not present

## 2018-10-30 DIAGNOSIS — E11319 Type 2 diabetes mellitus with unspecified diabetic retinopathy without macular edema: Secondary | ICD-10-CM | POA: Diagnosis not present

## 2018-10-30 DIAGNOSIS — Z7902 Long term (current) use of antithrombotics/antiplatelets: Secondary | ICD-10-CM | POA: Diagnosis not present

## 2018-10-30 DIAGNOSIS — Z8673 Personal history of transient ischemic attack (TIA), and cerebral infarction without residual deficits: Secondary | ICD-10-CM | POA: Diagnosis not present

## 2018-11-02 DIAGNOSIS — E11319 Type 2 diabetes mellitus with unspecified diabetic retinopathy without macular edema: Secondary | ICD-10-CM | POA: Diagnosis not present

## 2018-11-02 DIAGNOSIS — Z7982 Long term (current) use of aspirin: Secondary | ICD-10-CM | POA: Diagnosis not present

## 2018-11-02 DIAGNOSIS — J449 Chronic obstructive pulmonary disease, unspecified: Secondary | ICD-10-CM | POA: Diagnosis not present

## 2018-11-02 DIAGNOSIS — Z7902 Long term (current) use of antithrombotics/antiplatelets: Secondary | ICD-10-CM | POA: Diagnosis not present

## 2018-11-02 DIAGNOSIS — I10 Essential (primary) hypertension: Secondary | ICD-10-CM | POA: Diagnosis not present

## 2018-11-02 DIAGNOSIS — E785 Hyperlipidemia, unspecified: Secondary | ICD-10-CM | POA: Diagnosis not present

## 2018-11-02 DIAGNOSIS — Z8673 Personal history of transient ischemic attack (TIA), and cerebral infarction without residual deficits: Secondary | ICD-10-CM | POA: Diagnosis not present

## 2018-11-02 DIAGNOSIS — G309 Alzheimer's disease, unspecified: Secondary | ICD-10-CM | POA: Diagnosis not present

## 2018-11-02 DIAGNOSIS — I679 Cerebrovascular disease, unspecified: Secondary | ICD-10-CM | POA: Diagnosis not present

## 2018-11-02 DIAGNOSIS — Z794 Long term (current) use of insulin: Secondary | ICD-10-CM | POA: Diagnosis not present

## 2018-11-06 DIAGNOSIS — Z7902 Long term (current) use of antithrombotics/antiplatelets: Secondary | ICD-10-CM | POA: Diagnosis not present

## 2018-11-06 DIAGNOSIS — E785 Hyperlipidemia, unspecified: Secondary | ICD-10-CM | POA: Diagnosis not present

## 2018-11-06 DIAGNOSIS — J449 Chronic obstructive pulmonary disease, unspecified: Secondary | ICD-10-CM | POA: Diagnosis not present

## 2018-11-06 DIAGNOSIS — I10 Essential (primary) hypertension: Secondary | ICD-10-CM | POA: Diagnosis not present

## 2018-11-06 DIAGNOSIS — Z8673 Personal history of transient ischemic attack (TIA), and cerebral infarction without residual deficits: Secondary | ICD-10-CM | POA: Diagnosis not present

## 2018-11-06 DIAGNOSIS — I679 Cerebrovascular disease, unspecified: Secondary | ICD-10-CM | POA: Diagnosis not present

## 2018-11-06 DIAGNOSIS — G309 Alzheimer's disease, unspecified: Secondary | ICD-10-CM | POA: Diagnosis not present

## 2018-11-06 DIAGNOSIS — Z7982 Long term (current) use of aspirin: Secondary | ICD-10-CM | POA: Diagnosis not present

## 2018-11-06 DIAGNOSIS — Z794 Long term (current) use of insulin: Secondary | ICD-10-CM | POA: Diagnosis not present

## 2018-11-06 DIAGNOSIS — E11319 Type 2 diabetes mellitus with unspecified diabetic retinopathy without macular edema: Secondary | ICD-10-CM | POA: Diagnosis not present

## 2018-11-09 DIAGNOSIS — E785 Hyperlipidemia, unspecified: Secondary | ICD-10-CM | POA: Diagnosis not present

## 2018-11-09 DIAGNOSIS — E11319 Type 2 diabetes mellitus with unspecified diabetic retinopathy without macular edema: Secondary | ICD-10-CM | POA: Diagnosis not present

## 2018-11-09 DIAGNOSIS — I10 Essential (primary) hypertension: Secondary | ICD-10-CM | POA: Diagnosis not present

## 2018-11-09 DIAGNOSIS — G309 Alzheimer's disease, unspecified: Secondary | ICD-10-CM | POA: Diagnosis not present

## 2018-11-09 DIAGNOSIS — Z7982 Long term (current) use of aspirin: Secondary | ICD-10-CM | POA: Diagnosis not present

## 2018-11-09 DIAGNOSIS — J449 Chronic obstructive pulmonary disease, unspecified: Secondary | ICD-10-CM | POA: Diagnosis not present

## 2018-11-09 DIAGNOSIS — Z794 Long term (current) use of insulin: Secondary | ICD-10-CM | POA: Diagnosis not present

## 2018-11-09 DIAGNOSIS — Z8673 Personal history of transient ischemic attack (TIA), and cerebral infarction without residual deficits: Secondary | ICD-10-CM | POA: Diagnosis not present

## 2018-11-09 DIAGNOSIS — Z7902 Long term (current) use of antithrombotics/antiplatelets: Secondary | ICD-10-CM | POA: Diagnosis not present

## 2018-11-09 DIAGNOSIS — I679 Cerebrovascular disease, unspecified: Secondary | ICD-10-CM | POA: Diagnosis not present

## 2018-11-13 DIAGNOSIS — I679 Cerebrovascular disease, unspecified: Secondary | ICD-10-CM | POA: Diagnosis not present

## 2018-11-13 DIAGNOSIS — Z7902 Long term (current) use of antithrombotics/antiplatelets: Secondary | ICD-10-CM | POA: Diagnosis not present

## 2018-11-13 DIAGNOSIS — B351 Tinea unguium: Secondary | ICD-10-CM | POA: Diagnosis not present

## 2018-11-13 DIAGNOSIS — Z8673 Personal history of transient ischemic attack (TIA), and cerebral infarction without residual deficits: Secondary | ICD-10-CM | POA: Diagnosis not present

## 2018-11-13 DIAGNOSIS — Z794 Long term (current) use of insulin: Secondary | ICD-10-CM | POA: Diagnosis not present

## 2018-11-13 DIAGNOSIS — E785 Hyperlipidemia, unspecified: Secondary | ICD-10-CM | POA: Diagnosis not present

## 2018-11-13 DIAGNOSIS — E11319 Type 2 diabetes mellitus with unspecified diabetic retinopathy without macular edema: Secondary | ICD-10-CM | POA: Diagnosis not present

## 2018-11-13 DIAGNOSIS — Z7982 Long term (current) use of aspirin: Secondary | ICD-10-CM | POA: Diagnosis not present

## 2018-11-13 DIAGNOSIS — J449 Chronic obstructive pulmonary disease, unspecified: Secondary | ICD-10-CM | POA: Diagnosis not present

## 2018-11-13 DIAGNOSIS — I10 Essential (primary) hypertension: Secondary | ICD-10-CM | POA: Diagnosis not present

## 2018-11-13 DIAGNOSIS — G309 Alzheimer's disease, unspecified: Secondary | ICD-10-CM | POA: Diagnosis not present

## 2018-11-17 DIAGNOSIS — Z794 Long term (current) use of insulin: Secondary | ICD-10-CM | POA: Diagnosis not present

## 2018-11-17 DIAGNOSIS — Z7902 Long term (current) use of antithrombotics/antiplatelets: Secondary | ICD-10-CM | POA: Diagnosis not present

## 2018-11-17 DIAGNOSIS — E785 Hyperlipidemia, unspecified: Secondary | ICD-10-CM | POA: Diagnosis not present

## 2018-11-17 DIAGNOSIS — Z7982 Long term (current) use of aspirin: Secondary | ICD-10-CM | POA: Diagnosis not present

## 2018-11-17 DIAGNOSIS — Z8673 Personal history of transient ischemic attack (TIA), and cerebral infarction without residual deficits: Secondary | ICD-10-CM | POA: Diagnosis not present

## 2018-11-17 DIAGNOSIS — E11319 Type 2 diabetes mellitus with unspecified diabetic retinopathy without macular edema: Secondary | ICD-10-CM | POA: Diagnosis not present

## 2018-11-17 DIAGNOSIS — G309 Alzheimer's disease, unspecified: Secondary | ICD-10-CM | POA: Diagnosis not present

## 2018-11-17 DIAGNOSIS — J449 Chronic obstructive pulmonary disease, unspecified: Secondary | ICD-10-CM | POA: Diagnosis not present

## 2018-11-17 DIAGNOSIS — I10 Essential (primary) hypertension: Secondary | ICD-10-CM | POA: Diagnosis not present

## 2018-11-17 DIAGNOSIS — I679 Cerebrovascular disease, unspecified: Secondary | ICD-10-CM | POA: Diagnosis not present

## 2018-11-18 DIAGNOSIS — Z794 Long term (current) use of insulin: Secondary | ICD-10-CM | POA: Diagnosis not present

## 2018-11-18 DIAGNOSIS — E11319 Type 2 diabetes mellitus with unspecified diabetic retinopathy without macular edema: Secondary | ICD-10-CM | POA: Diagnosis not present

## 2018-11-18 DIAGNOSIS — Z7982 Long term (current) use of aspirin: Secondary | ICD-10-CM | POA: Diagnosis not present

## 2018-11-18 DIAGNOSIS — Z8673 Personal history of transient ischemic attack (TIA), and cerebral infarction without residual deficits: Secondary | ICD-10-CM | POA: Diagnosis not present

## 2018-11-18 DIAGNOSIS — I679 Cerebrovascular disease, unspecified: Secondary | ICD-10-CM | POA: Diagnosis not present

## 2018-11-18 DIAGNOSIS — Z7902 Long term (current) use of antithrombotics/antiplatelets: Secondary | ICD-10-CM | POA: Diagnosis not present

## 2018-11-18 DIAGNOSIS — G309 Alzheimer's disease, unspecified: Secondary | ICD-10-CM | POA: Diagnosis not present

## 2018-11-18 DIAGNOSIS — E785 Hyperlipidemia, unspecified: Secondary | ICD-10-CM | POA: Diagnosis not present

## 2018-11-18 DIAGNOSIS — J449 Chronic obstructive pulmonary disease, unspecified: Secondary | ICD-10-CM | POA: Diagnosis not present

## 2018-11-18 DIAGNOSIS — I10 Essential (primary) hypertension: Secondary | ICD-10-CM | POA: Diagnosis not present

## 2018-11-19 DIAGNOSIS — Z8673 Personal history of transient ischemic attack (TIA), and cerebral infarction without residual deficits: Secondary | ICD-10-CM | POA: Diagnosis not present

## 2018-11-19 DIAGNOSIS — J449 Chronic obstructive pulmonary disease, unspecified: Secondary | ICD-10-CM | POA: Diagnosis not present

## 2018-11-19 DIAGNOSIS — Z7982 Long term (current) use of aspirin: Secondary | ICD-10-CM | POA: Diagnosis not present

## 2018-11-19 DIAGNOSIS — G309 Alzheimer's disease, unspecified: Secondary | ICD-10-CM | POA: Diagnosis not present

## 2018-11-19 DIAGNOSIS — Z794 Long term (current) use of insulin: Secondary | ICD-10-CM | POA: Diagnosis not present

## 2018-11-19 DIAGNOSIS — E11319 Type 2 diabetes mellitus with unspecified diabetic retinopathy without macular edema: Secondary | ICD-10-CM | POA: Diagnosis not present

## 2018-11-19 DIAGNOSIS — E785 Hyperlipidemia, unspecified: Secondary | ICD-10-CM | POA: Diagnosis not present

## 2018-11-19 DIAGNOSIS — I10 Essential (primary) hypertension: Secondary | ICD-10-CM | POA: Diagnosis not present

## 2018-11-19 DIAGNOSIS — Z7902 Long term (current) use of antithrombotics/antiplatelets: Secondary | ICD-10-CM | POA: Diagnosis not present

## 2018-11-19 DIAGNOSIS — I679 Cerebrovascular disease, unspecified: Secondary | ICD-10-CM | POA: Diagnosis not present

## 2018-11-24 DIAGNOSIS — E11319 Type 2 diabetes mellitus with unspecified diabetic retinopathy without macular edema: Secondary | ICD-10-CM | POA: Diagnosis not present

## 2018-11-24 DIAGNOSIS — E785 Hyperlipidemia, unspecified: Secondary | ICD-10-CM | POA: Diagnosis not present

## 2018-11-24 DIAGNOSIS — I10 Essential (primary) hypertension: Secondary | ICD-10-CM | POA: Diagnosis not present

## 2018-11-24 DIAGNOSIS — J449 Chronic obstructive pulmonary disease, unspecified: Secondary | ICD-10-CM | POA: Diagnosis not present

## 2018-11-25 DIAGNOSIS — Z8673 Personal history of transient ischemic attack (TIA), and cerebral infarction without residual deficits: Secondary | ICD-10-CM | POA: Diagnosis not present

## 2018-11-25 DIAGNOSIS — Z794 Long term (current) use of insulin: Secondary | ICD-10-CM | POA: Diagnosis not present

## 2018-11-25 DIAGNOSIS — E11319 Type 2 diabetes mellitus with unspecified diabetic retinopathy without macular edema: Secondary | ICD-10-CM | POA: Diagnosis not present

## 2018-11-25 DIAGNOSIS — G309 Alzheimer's disease, unspecified: Secondary | ICD-10-CM | POA: Diagnosis not present

## 2018-11-25 DIAGNOSIS — J449 Chronic obstructive pulmonary disease, unspecified: Secondary | ICD-10-CM | POA: Diagnosis not present

## 2018-11-25 DIAGNOSIS — Z7902 Long term (current) use of antithrombotics/antiplatelets: Secondary | ICD-10-CM | POA: Diagnosis not present

## 2018-11-25 DIAGNOSIS — E785 Hyperlipidemia, unspecified: Secondary | ICD-10-CM | POA: Diagnosis not present

## 2018-11-25 DIAGNOSIS — I10 Essential (primary) hypertension: Secondary | ICD-10-CM | POA: Diagnosis not present

## 2018-11-25 DIAGNOSIS — I679 Cerebrovascular disease, unspecified: Secondary | ICD-10-CM | POA: Diagnosis not present

## 2018-11-25 DIAGNOSIS — Z7982 Long term (current) use of aspirin: Secondary | ICD-10-CM | POA: Diagnosis not present

## 2018-11-26 DIAGNOSIS — G309 Alzheimer's disease, unspecified: Secondary | ICD-10-CM | POA: Diagnosis not present

## 2018-11-26 DIAGNOSIS — E785 Hyperlipidemia, unspecified: Secondary | ICD-10-CM | POA: Diagnosis not present

## 2018-11-26 DIAGNOSIS — Z7982 Long term (current) use of aspirin: Secondary | ICD-10-CM | POA: Diagnosis not present

## 2018-11-26 DIAGNOSIS — Z794 Long term (current) use of insulin: Secondary | ICD-10-CM | POA: Diagnosis not present

## 2018-11-26 DIAGNOSIS — J449 Chronic obstructive pulmonary disease, unspecified: Secondary | ICD-10-CM | POA: Diagnosis not present

## 2018-11-26 DIAGNOSIS — E11319 Type 2 diabetes mellitus with unspecified diabetic retinopathy without macular edema: Secondary | ICD-10-CM | POA: Diagnosis not present

## 2018-11-26 DIAGNOSIS — Z8673 Personal history of transient ischemic attack (TIA), and cerebral infarction without residual deficits: Secondary | ICD-10-CM | POA: Diagnosis not present

## 2018-11-26 DIAGNOSIS — I10 Essential (primary) hypertension: Secondary | ICD-10-CM | POA: Diagnosis not present

## 2018-11-26 DIAGNOSIS — Z7902 Long term (current) use of antithrombotics/antiplatelets: Secondary | ICD-10-CM | POA: Diagnosis not present

## 2018-11-26 DIAGNOSIS — I679 Cerebrovascular disease, unspecified: Secondary | ICD-10-CM | POA: Diagnosis not present

## 2018-11-27 DIAGNOSIS — E119 Type 2 diabetes mellitus without complications: Secondary | ICD-10-CM | POA: Diagnosis not present

## 2018-11-27 DIAGNOSIS — E038 Other specified hypothyroidism: Secondary | ICD-10-CM | POA: Diagnosis not present

## 2018-11-27 DIAGNOSIS — D518 Other vitamin B12 deficiency anemias: Secondary | ICD-10-CM | POA: Diagnosis not present

## 2018-12-01 DIAGNOSIS — J449 Chronic obstructive pulmonary disease, unspecified: Secondary | ICD-10-CM | POA: Diagnosis not present

## 2018-12-01 DIAGNOSIS — I679 Cerebrovascular disease, unspecified: Secondary | ICD-10-CM | POA: Diagnosis not present

## 2018-12-01 DIAGNOSIS — E11319 Type 2 diabetes mellitus with unspecified diabetic retinopathy without macular edema: Secondary | ICD-10-CM | POA: Diagnosis not present

## 2018-12-01 DIAGNOSIS — E785 Hyperlipidemia, unspecified: Secondary | ICD-10-CM | POA: Diagnosis not present

## 2018-12-01 DIAGNOSIS — G309 Alzheimer's disease, unspecified: Secondary | ICD-10-CM | POA: Diagnosis not present

## 2018-12-01 DIAGNOSIS — Z7982 Long term (current) use of aspirin: Secondary | ICD-10-CM | POA: Diagnosis not present

## 2018-12-01 DIAGNOSIS — Z794 Long term (current) use of insulin: Secondary | ICD-10-CM | POA: Diagnosis not present

## 2018-12-01 DIAGNOSIS — I10 Essential (primary) hypertension: Secondary | ICD-10-CM | POA: Diagnosis not present

## 2018-12-01 DIAGNOSIS — Z8673 Personal history of transient ischemic attack (TIA), and cerebral infarction without residual deficits: Secondary | ICD-10-CM | POA: Diagnosis not present

## 2018-12-01 DIAGNOSIS — Z7902 Long term (current) use of antithrombotics/antiplatelets: Secondary | ICD-10-CM | POA: Diagnosis not present

## 2018-12-03 DIAGNOSIS — Z7902 Long term (current) use of antithrombotics/antiplatelets: Secondary | ICD-10-CM | POA: Diagnosis not present

## 2018-12-03 DIAGNOSIS — E11319 Type 2 diabetes mellitus with unspecified diabetic retinopathy without macular edema: Secondary | ICD-10-CM | POA: Diagnosis not present

## 2018-12-03 DIAGNOSIS — I679 Cerebrovascular disease, unspecified: Secondary | ICD-10-CM | POA: Diagnosis not present

## 2018-12-03 DIAGNOSIS — I10 Essential (primary) hypertension: Secondary | ICD-10-CM | POA: Diagnosis not present

## 2018-12-03 DIAGNOSIS — G309 Alzheimer's disease, unspecified: Secondary | ICD-10-CM | POA: Diagnosis not present

## 2018-12-03 DIAGNOSIS — E785 Hyperlipidemia, unspecified: Secondary | ICD-10-CM | POA: Diagnosis not present

## 2018-12-03 DIAGNOSIS — J449 Chronic obstructive pulmonary disease, unspecified: Secondary | ICD-10-CM | POA: Diagnosis not present

## 2018-12-03 DIAGNOSIS — Z8673 Personal history of transient ischemic attack (TIA), and cerebral infarction without residual deficits: Secondary | ICD-10-CM | POA: Diagnosis not present

## 2018-12-03 DIAGNOSIS — Z794 Long term (current) use of insulin: Secondary | ICD-10-CM | POA: Diagnosis not present

## 2018-12-03 DIAGNOSIS — Z7982 Long term (current) use of aspirin: Secondary | ICD-10-CM | POA: Diagnosis not present

## 2018-12-05 DIAGNOSIS — E119 Type 2 diabetes mellitus without complications: Secondary | ICD-10-CM | POA: Diagnosis not present

## 2018-12-05 DIAGNOSIS — E038 Other specified hypothyroidism: Secondary | ICD-10-CM | POA: Diagnosis not present

## 2018-12-05 DIAGNOSIS — D518 Other vitamin B12 deficiency anemias: Secondary | ICD-10-CM | POA: Diagnosis not present

## 2018-12-08 DIAGNOSIS — Z7902 Long term (current) use of antithrombotics/antiplatelets: Secondary | ICD-10-CM | POA: Diagnosis not present

## 2018-12-08 DIAGNOSIS — I679 Cerebrovascular disease, unspecified: Secondary | ICD-10-CM | POA: Diagnosis not present

## 2018-12-08 DIAGNOSIS — E785 Hyperlipidemia, unspecified: Secondary | ICD-10-CM | POA: Diagnosis not present

## 2018-12-08 DIAGNOSIS — E11319 Type 2 diabetes mellitus with unspecified diabetic retinopathy without macular edema: Secondary | ICD-10-CM | POA: Diagnosis not present

## 2018-12-08 DIAGNOSIS — I10 Essential (primary) hypertension: Secondary | ICD-10-CM | POA: Diagnosis not present

## 2018-12-08 DIAGNOSIS — Z794 Long term (current) use of insulin: Secondary | ICD-10-CM | POA: Diagnosis not present

## 2018-12-08 DIAGNOSIS — Z7982 Long term (current) use of aspirin: Secondary | ICD-10-CM | POA: Diagnosis not present

## 2018-12-08 DIAGNOSIS — Z8673 Personal history of transient ischemic attack (TIA), and cerebral infarction without residual deficits: Secondary | ICD-10-CM | POA: Diagnosis not present

## 2018-12-08 DIAGNOSIS — G309 Alzheimer's disease, unspecified: Secondary | ICD-10-CM | POA: Diagnosis not present

## 2018-12-08 DIAGNOSIS — J449 Chronic obstructive pulmonary disease, unspecified: Secondary | ICD-10-CM | POA: Diagnosis not present

## 2018-12-09 DIAGNOSIS — Z8673 Personal history of transient ischemic attack (TIA), and cerebral infarction without residual deficits: Secondary | ICD-10-CM | POA: Diagnosis not present

## 2018-12-09 DIAGNOSIS — Z794 Long term (current) use of insulin: Secondary | ICD-10-CM | POA: Diagnosis not present

## 2018-12-09 DIAGNOSIS — I679 Cerebrovascular disease, unspecified: Secondary | ICD-10-CM | POA: Diagnosis not present

## 2018-12-09 DIAGNOSIS — Z7902 Long term (current) use of antithrombotics/antiplatelets: Secondary | ICD-10-CM | POA: Diagnosis not present

## 2018-12-09 DIAGNOSIS — G309 Alzheimer's disease, unspecified: Secondary | ICD-10-CM | POA: Diagnosis not present

## 2018-12-09 DIAGNOSIS — Z7982 Long term (current) use of aspirin: Secondary | ICD-10-CM | POA: Diagnosis not present

## 2018-12-09 DIAGNOSIS — I10 Essential (primary) hypertension: Secondary | ICD-10-CM | POA: Diagnosis not present

## 2018-12-09 DIAGNOSIS — J449 Chronic obstructive pulmonary disease, unspecified: Secondary | ICD-10-CM | POA: Diagnosis not present

## 2018-12-09 DIAGNOSIS — E11319 Type 2 diabetes mellitus with unspecified diabetic retinopathy without macular edema: Secondary | ICD-10-CM | POA: Diagnosis not present

## 2018-12-09 DIAGNOSIS — E785 Hyperlipidemia, unspecified: Secondary | ICD-10-CM | POA: Diagnosis not present

## 2018-12-15 DIAGNOSIS — E11319 Type 2 diabetes mellitus with unspecified diabetic retinopathy without macular edema: Secondary | ICD-10-CM | POA: Diagnosis not present

## 2018-12-15 DIAGNOSIS — Z7982 Long term (current) use of aspirin: Secondary | ICD-10-CM | POA: Diagnosis not present

## 2018-12-15 DIAGNOSIS — E785 Hyperlipidemia, unspecified: Secondary | ICD-10-CM | POA: Diagnosis not present

## 2018-12-15 DIAGNOSIS — I10 Essential (primary) hypertension: Secondary | ICD-10-CM | POA: Diagnosis not present

## 2018-12-15 DIAGNOSIS — J449 Chronic obstructive pulmonary disease, unspecified: Secondary | ICD-10-CM | POA: Diagnosis not present

## 2018-12-15 DIAGNOSIS — G309 Alzheimer's disease, unspecified: Secondary | ICD-10-CM | POA: Diagnosis not present

## 2018-12-15 DIAGNOSIS — I679 Cerebrovascular disease, unspecified: Secondary | ICD-10-CM | POA: Diagnosis not present

## 2018-12-15 DIAGNOSIS — Z794 Long term (current) use of insulin: Secondary | ICD-10-CM | POA: Diagnosis not present

## 2018-12-15 DIAGNOSIS — Z7902 Long term (current) use of antithrombotics/antiplatelets: Secondary | ICD-10-CM | POA: Diagnosis not present

## 2018-12-15 DIAGNOSIS — Z8673 Personal history of transient ischemic attack (TIA), and cerebral infarction without residual deficits: Secondary | ICD-10-CM | POA: Diagnosis not present

## 2018-12-23 DIAGNOSIS — G309 Alzheimer's disease, unspecified: Secondary | ICD-10-CM | POA: Diagnosis not present

## 2018-12-23 DIAGNOSIS — I10 Essential (primary) hypertension: Secondary | ICD-10-CM | POA: Diagnosis not present

## 2018-12-23 DIAGNOSIS — Z7982 Long term (current) use of aspirin: Secondary | ICD-10-CM | POA: Diagnosis not present

## 2018-12-23 DIAGNOSIS — E785 Hyperlipidemia, unspecified: Secondary | ICD-10-CM | POA: Diagnosis not present

## 2018-12-23 DIAGNOSIS — J449 Chronic obstructive pulmonary disease, unspecified: Secondary | ICD-10-CM | POA: Diagnosis not present

## 2018-12-23 DIAGNOSIS — I679 Cerebrovascular disease, unspecified: Secondary | ICD-10-CM | POA: Diagnosis not present

## 2018-12-23 DIAGNOSIS — Z794 Long term (current) use of insulin: Secondary | ICD-10-CM | POA: Diagnosis not present

## 2018-12-23 DIAGNOSIS — Z7902 Long term (current) use of antithrombotics/antiplatelets: Secondary | ICD-10-CM | POA: Diagnosis not present

## 2018-12-23 DIAGNOSIS — E11319 Type 2 diabetes mellitus with unspecified diabetic retinopathy without macular edema: Secondary | ICD-10-CM | POA: Diagnosis not present

## 2018-12-23 DIAGNOSIS — Z8673 Personal history of transient ischemic attack (TIA), and cerebral infarction without residual deficits: Secondary | ICD-10-CM | POA: Diagnosis not present

## 2018-12-30 ENCOUNTER — Other Ambulatory Visit: Payer: Self-pay

## 2018-12-30 DIAGNOSIS — J449 Chronic obstructive pulmonary disease, unspecified: Secondary | ICD-10-CM | POA: Diagnosis not present

## 2018-12-30 DIAGNOSIS — G309 Alzheimer's disease, unspecified: Secondary | ICD-10-CM | POA: Diagnosis not present

## 2018-12-30 DIAGNOSIS — Z7982 Long term (current) use of aspirin: Secondary | ICD-10-CM | POA: Diagnosis not present

## 2018-12-30 DIAGNOSIS — E11319 Type 2 diabetes mellitus with unspecified diabetic retinopathy without macular edema: Secondary | ICD-10-CM | POA: Diagnosis not present

## 2018-12-30 DIAGNOSIS — Z8673 Personal history of transient ischemic attack (TIA), and cerebral infarction without residual deficits: Secondary | ICD-10-CM | POA: Diagnosis not present

## 2018-12-30 DIAGNOSIS — I679 Cerebrovascular disease, unspecified: Secondary | ICD-10-CM | POA: Diagnosis not present

## 2018-12-30 DIAGNOSIS — Z7902 Long term (current) use of antithrombotics/antiplatelets: Secondary | ICD-10-CM | POA: Diagnosis not present

## 2018-12-30 DIAGNOSIS — E785 Hyperlipidemia, unspecified: Secondary | ICD-10-CM | POA: Diagnosis not present

## 2018-12-30 DIAGNOSIS — I10 Essential (primary) hypertension: Secondary | ICD-10-CM | POA: Diagnosis not present

## 2018-12-30 DIAGNOSIS — Z794 Long term (current) use of insulin: Secondary | ICD-10-CM | POA: Diagnosis not present

## 2018-12-30 NOTE — Patient Outreach (Signed)
[place] [facility]) Care Management  12/30/2018  Jasmine Arias 07/15/1935 [id number]  Medication Adherence call to Jasmine Arias HIPPA Compliant Voice message left with a call back number. Jasmine Arias is showing past due on Atorvastatin 40 mg and Lisinopril 40 mg under [place].   [place] Management Direct Dial [phone]  Fax [phone] [email or web address]

## 2019-01-05 DIAGNOSIS — E038 Other specified hypothyroidism: Secondary | ICD-10-CM | POA: Diagnosis not present

## 2019-01-05 DIAGNOSIS — E119 Type 2 diabetes mellitus without complications: Secondary | ICD-10-CM | POA: Diagnosis not present

## 2019-01-05 DIAGNOSIS — E11319 Type 2 diabetes mellitus with unspecified diabetic retinopathy without macular edema: Secondary | ICD-10-CM | POA: Diagnosis not present

## 2019-01-05 DIAGNOSIS — I10 Essential (primary) hypertension: Secondary | ICD-10-CM | POA: Diagnosis not present

## 2019-01-05 DIAGNOSIS — J449 Chronic obstructive pulmonary disease, unspecified: Secondary | ICD-10-CM | POA: Diagnosis not present

## 2019-01-05 DIAGNOSIS — D518 Other vitamin B12 deficiency anemias: Secondary | ICD-10-CM | POA: Diagnosis not present

## 2019-01-07 DIAGNOSIS — Z7982 Long term (current) use of aspirin: Secondary | ICD-10-CM | POA: Diagnosis not present

## 2019-01-07 DIAGNOSIS — Z7902 Long term (current) use of antithrombotics/antiplatelets: Secondary | ICD-10-CM | POA: Diagnosis not present

## 2019-01-07 DIAGNOSIS — G309 Alzheimer's disease, unspecified: Secondary | ICD-10-CM | POA: Diagnosis not present

## 2019-01-07 DIAGNOSIS — I10 Essential (primary) hypertension: Secondary | ICD-10-CM | POA: Diagnosis not present

## 2019-01-07 DIAGNOSIS — J449 Chronic obstructive pulmonary disease, unspecified: Secondary | ICD-10-CM | POA: Diagnosis not present

## 2019-01-07 DIAGNOSIS — I679 Cerebrovascular disease, unspecified: Secondary | ICD-10-CM | POA: Diagnosis not present

## 2019-01-07 DIAGNOSIS — E785 Hyperlipidemia, unspecified: Secondary | ICD-10-CM | POA: Diagnosis not present

## 2019-01-07 DIAGNOSIS — E11319 Type 2 diabetes mellitus with unspecified diabetic retinopathy without macular edema: Secondary | ICD-10-CM | POA: Diagnosis not present

## 2019-01-07 DIAGNOSIS — Z794 Long term (current) use of insulin: Secondary | ICD-10-CM | POA: Diagnosis not present

## 2019-01-07 DIAGNOSIS — Z8673 Personal history of transient ischemic attack (TIA), and cerebral infarction without residual deficits: Secondary | ICD-10-CM | POA: Diagnosis not present

## 2019-01-07 DIAGNOSIS — R05 Cough: Secondary | ICD-10-CM | POA: Diagnosis not present

## 2019-01-15 DIAGNOSIS — G309 Alzheimer's disease, unspecified: Secondary | ICD-10-CM | POA: Diagnosis not present

## 2019-01-15 DIAGNOSIS — I10 Essential (primary) hypertension: Secondary | ICD-10-CM | POA: Diagnosis not present

## 2019-01-15 DIAGNOSIS — J449 Chronic obstructive pulmonary disease, unspecified: Secondary | ICD-10-CM | POA: Diagnosis not present

## 2019-01-15 DIAGNOSIS — Z8673 Personal history of transient ischemic attack (TIA), and cerebral infarction without residual deficits: Secondary | ICD-10-CM | POA: Diagnosis not present

## 2019-01-15 DIAGNOSIS — Z7902 Long term (current) use of antithrombotics/antiplatelets: Secondary | ICD-10-CM | POA: Diagnosis not present

## 2019-01-15 DIAGNOSIS — I679 Cerebrovascular disease, unspecified: Secondary | ICD-10-CM | POA: Diagnosis not present

## 2019-01-15 DIAGNOSIS — Z794 Long term (current) use of insulin: Secondary | ICD-10-CM | POA: Diagnosis not present

## 2019-01-15 DIAGNOSIS — Z7982 Long term (current) use of aspirin: Secondary | ICD-10-CM | POA: Diagnosis not present

## 2019-01-15 DIAGNOSIS — E785 Hyperlipidemia, unspecified: Secondary | ICD-10-CM | POA: Diagnosis not present

## 2019-01-15 DIAGNOSIS — E11319 Type 2 diabetes mellitus with unspecified diabetic retinopathy without macular edema: Secondary | ICD-10-CM | POA: Diagnosis not present

## 2019-01-22 DIAGNOSIS — Z794 Long term (current) use of insulin: Secondary | ICD-10-CM | POA: Diagnosis not present

## 2019-01-22 DIAGNOSIS — Z8673 Personal history of transient ischemic attack (TIA), and cerebral infarction without residual deficits: Secondary | ICD-10-CM | POA: Diagnosis not present

## 2019-01-22 DIAGNOSIS — Z7902 Long term (current) use of antithrombotics/antiplatelets: Secondary | ICD-10-CM | POA: Diagnosis not present

## 2019-01-22 DIAGNOSIS — I679 Cerebrovascular disease, unspecified: Secondary | ICD-10-CM | POA: Diagnosis not present

## 2019-01-22 DIAGNOSIS — J449 Chronic obstructive pulmonary disease, unspecified: Secondary | ICD-10-CM | POA: Diagnosis not present

## 2019-01-22 DIAGNOSIS — I10 Essential (primary) hypertension: Secondary | ICD-10-CM | POA: Diagnosis not present

## 2019-01-22 DIAGNOSIS — G309 Alzheimer's disease, unspecified: Secondary | ICD-10-CM | POA: Diagnosis not present

## 2019-01-22 DIAGNOSIS — E11319 Type 2 diabetes mellitus with unspecified diabetic retinopathy without macular edema: Secondary | ICD-10-CM | POA: Diagnosis not present

## 2019-01-22 DIAGNOSIS — E785 Hyperlipidemia, unspecified: Secondary | ICD-10-CM | POA: Diagnosis not present

## 2019-01-22 DIAGNOSIS — Z7982 Long term (current) use of aspirin: Secondary | ICD-10-CM | POA: Diagnosis not present

## 2019-01-28 DIAGNOSIS — J449 Chronic obstructive pulmonary disease, unspecified: Secondary | ICD-10-CM | POA: Diagnosis not present

## 2019-01-28 DIAGNOSIS — G309 Alzheimer's disease, unspecified: Secondary | ICD-10-CM | POA: Diagnosis not present

## 2019-01-28 DIAGNOSIS — E11319 Type 2 diabetes mellitus with unspecified diabetic retinopathy without macular edema: Secondary | ICD-10-CM | POA: Diagnosis not present

## 2019-01-29 DIAGNOSIS — Z794 Long term (current) use of insulin: Secondary | ICD-10-CM | POA: Diagnosis not present

## 2019-01-29 DIAGNOSIS — Z7982 Long term (current) use of aspirin: Secondary | ICD-10-CM | POA: Diagnosis not present

## 2019-01-29 DIAGNOSIS — Z7902 Long term (current) use of antithrombotics/antiplatelets: Secondary | ICD-10-CM | POA: Diagnosis not present

## 2019-01-29 DIAGNOSIS — G309 Alzheimer's disease, unspecified: Secondary | ICD-10-CM | POA: Diagnosis not present

## 2019-01-29 DIAGNOSIS — I679 Cerebrovascular disease, unspecified: Secondary | ICD-10-CM | POA: Diagnosis not present

## 2019-01-29 DIAGNOSIS — E11319 Type 2 diabetes mellitus with unspecified diabetic retinopathy without macular edema: Secondary | ICD-10-CM | POA: Diagnosis not present

## 2019-01-29 DIAGNOSIS — E785 Hyperlipidemia, unspecified: Secondary | ICD-10-CM | POA: Diagnosis not present

## 2019-01-29 DIAGNOSIS — J449 Chronic obstructive pulmonary disease, unspecified: Secondary | ICD-10-CM | POA: Diagnosis not present

## 2019-01-29 DIAGNOSIS — Z8673 Personal history of transient ischemic attack (TIA), and cerebral infarction without residual deficits: Secondary | ICD-10-CM | POA: Diagnosis not present

## 2019-01-29 DIAGNOSIS — I10 Essential (primary) hypertension: Secondary | ICD-10-CM | POA: Diagnosis not present

## 2019-02-05 DIAGNOSIS — I679 Cerebrovascular disease, unspecified: Secondary | ICD-10-CM | POA: Diagnosis not present

## 2019-02-05 DIAGNOSIS — J449 Chronic obstructive pulmonary disease, unspecified: Secondary | ICD-10-CM | POA: Diagnosis not present

## 2019-02-05 DIAGNOSIS — Z7902 Long term (current) use of antithrombotics/antiplatelets: Secondary | ICD-10-CM | POA: Diagnosis not present

## 2019-02-05 DIAGNOSIS — E785 Hyperlipidemia, unspecified: Secondary | ICD-10-CM | POA: Diagnosis not present

## 2019-02-05 DIAGNOSIS — Z794 Long term (current) use of insulin: Secondary | ICD-10-CM | POA: Diagnosis not present

## 2019-02-05 DIAGNOSIS — Z7982 Long term (current) use of aspirin: Secondary | ICD-10-CM | POA: Diagnosis not present

## 2019-02-05 DIAGNOSIS — I10 Essential (primary) hypertension: Secondary | ICD-10-CM | POA: Diagnosis not present

## 2019-02-05 DIAGNOSIS — E11319 Type 2 diabetes mellitus with unspecified diabetic retinopathy without macular edema: Secondary | ICD-10-CM | POA: Diagnosis not present

## 2019-02-05 DIAGNOSIS — G309 Alzheimer's disease, unspecified: Secondary | ICD-10-CM | POA: Diagnosis not present

## 2019-02-05 DIAGNOSIS — Z8673 Personal history of transient ischemic attack (TIA), and cerebral infarction without residual deficits: Secondary | ICD-10-CM | POA: Diagnosis not present

## 2019-02-09 DIAGNOSIS — E785 Hyperlipidemia, unspecified: Secondary | ICD-10-CM | POA: Diagnosis not present

## 2019-02-09 DIAGNOSIS — I10 Essential (primary) hypertension: Secondary | ICD-10-CM | POA: Diagnosis not present

## 2019-02-09 DIAGNOSIS — J449 Chronic obstructive pulmonary disease, unspecified: Secondary | ICD-10-CM | POA: Diagnosis not present

## 2019-02-09 DIAGNOSIS — E11319 Type 2 diabetes mellitus with unspecified diabetic retinopathy without macular edema: Secondary | ICD-10-CM | POA: Diagnosis not present

## 2019-02-10 DIAGNOSIS — D518 Other vitamin B12 deficiency anemias: Secondary | ICD-10-CM | POA: Diagnosis not present

## 2019-02-10 DIAGNOSIS — E119 Type 2 diabetes mellitus without complications: Secondary | ICD-10-CM | POA: Diagnosis not present

## 2019-02-10 DIAGNOSIS — E038 Other specified hypothyroidism: Secondary | ICD-10-CM | POA: Diagnosis not present

## 2019-02-12 DIAGNOSIS — G309 Alzheimer's disease, unspecified: Secondary | ICD-10-CM | POA: Diagnosis not present

## 2019-02-12 DIAGNOSIS — Z8673 Personal history of transient ischemic attack (TIA), and cerebral infarction without residual deficits: Secondary | ICD-10-CM | POA: Diagnosis not present

## 2019-02-12 DIAGNOSIS — E785 Hyperlipidemia, unspecified: Secondary | ICD-10-CM | POA: Diagnosis not present

## 2019-02-12 DIAGNOSIS — I679 Cerebrovascular disease, unspecified: Secondary | ICD-10-CM | POA: Diagnosis not present

## 2019-02-12 DIAGNOSIS — E11319 Type 2 diabetes mellitus with unspecified diabetic retinopathy without macular edema: Secondary | ICD-10-CM | POA: Diagnosis not present

## 2019-02-12 DIAGNOSIS — Z7982 Long term (current) use of aspirin: Secondary | ICD-10-CM | POA: Diagnosis not present

## 2019-02-12 DIAGNOSIS — Z794 Long term (current) use of insulin: Secondary | ICD-10-CM | POA: Diagnosis not present

## 2019-02-12 DIAGNOSIS — I10 Essential (primary) hypertension: Secondary | ICD-10-CM | POA: Diagnosis not present

## 2019-02-12 DIAGNOSIS — Z7902 Long term (current) use of antithrombotics/antiplatelets: Secondary | ICD-10-CM | POA: Diagnosis not present

## 2019-02-12 DIAGNOSIS — J449 Chronic obstructive pulmonary disease, unspecified: Secondary | ICD-10-CM | POA: Diagnosis not present

## 2019-02-19 DIAGNOSIS — Z7982 Long term (current) use of aspirin: Secondary | ICD-10-CM | POA: Diagnosis not present

## 2019-02-19 DIAGNOSIS — E11319 Type 2 diabetes mellitus with unspecified diabetic retinopathy without macular edema: Secondary | ICD-10-CM | POA: Diagnosis not present

## 2019-02-19 DIAGNOSIS — G309 Alzheimer's disease, unspecified: Secondary | ICD-10-CM | POA: Diagnosis not present

## 2019-02-19 DIAGNOSIS — I679 Cerebrovascular disease, unspecified: Secondary | ICD-10-CM | POA: Diagnosis not present

## 2019-02-19 DIAGNOSIS — J449 Chronic obstructive pulmonary disease, unspecified: Secondary | ICD-10-CM | POA: Diagnosis not present

## 2019-02-19 DIAGNOSIS — Z8673 Personal history of transient ischemic attack (TIA), and cerebral infarction without residual deficits: Secondary | ICD-10-CM | POA: Diagnosis not present

## 2019-02-19 DIAGNOSIS — E785 Hyperlipidemia, unspecified: Secondary | ICD-10-CM | POA: Diagnosis not present

## 2019-02-19 DIAGNOSIS — Z7902 Long term (current) use of antithrombotics/antiplatelets: Secondary | ICD-10-CM | POA: Diagnosis not present

## 2019-02-19 DIAGNOSIS — Z794 Long term (current) use of insulin: Secondary | ICD-10-CM | POA: Diagnosis not present

## 2019-02-19 DIAGNOSIS — I10 Essential (primary) hypertension: Secondary | ICD-10-CM | POA: Diagnosis not present

## 2019-02-26 DIAGNOSIS — E11319 Type 2 diabetes mellitus with unspecified diabetic retinopathy without macular edema: Secondary | ICD-10-CM | POA: Diagnosis not present

## 2019-02-26 DIAGNOSIS — J449 Chronic obstructive pulmonary disease, unspecified: Secondary | ICD-10-CM | POA: Diagnosis not present

## 2019-02-26 DIAGNOSIS — Z794 Long term (current) use of insulin: Secondary | ICD-10-CM | POA: Diagnosis not present

## 2019-02-26 DIAGNOSIS — G309 Alzheimer's disease, unspecified: Secondary | ICD-10-CM | POA: Diagnosis not present

## 2019-02-26 DIAGNOSIS — Z7982 Long term (current) use of aspirin: Secondary | ICD-10-CM | POA: Diagnosis not present

## 2019-02-26 DIAGNOSIS — I679 Cerebrovascular disease, unspecified: Secondary | ICD-10-CM | POA: Diagnosis not present

## 2019-02-26 DIAGNOSIS — Z7902 Long term (current) use of antithrombotics/antiplatelets: Secondary | ICD-10-CM | POA: Diagnosis not present

## 2019-02-26 DIAGNOSIS — E785 Hyperlipidemia, unspecified: Secondary | ICD-10-CM | POA: Diagnosis not present

## 2019-02-26 DIAGNOSIS — Z8673 Personal history of transient ischemic attack (TIA), and cerebral infarction without residual deficits: Secondary | ICD-10-CM | POA: Diagnosis not present

## 2019-02-26 DIAGNOSIS — I10 Essential (primary) hypertension: Secondary | ICD-10-CM | POA: Diagnosis not present

## 2019-03-04 DIAGNOSIS — D518 Other vitamin B12 deficiency anemias: Secondary | ICD-10-CM | POA: Diagnosis not present

## 2019-03-04 DIAGNOSIS — E038 Other specified hypothyroidism: Secondary | ICD-10-CM | POA: Diagnosis not present

## 2019-03-04 DIAGNOSIS — E119 Type 2 diabetes mellitus without complications: Secondary | ICD-10-CM | POA: Diagnosis not present

## 2019-03-05 DIAGNOSIS — I679 Cerebrovascular disease, unspecified: Secondary | ICD-10-CM | POA: Diagnosis not present

## 2019-03-05 DIAGNOSIS — E785 Hyperlipidemia, unspecified: Secondary | ICD-10-CM | POA: Diagnosis not present

## 2019-03-05 DIAGNOSIS — Z794 Long term (current) use of insulin: Secondary | ICD-10-CM | POA: Diagnosis not present

## 2019-03-05 DIAGNOSIS — E11319 Type 2 diabetes mellitus with unspecified diabetic retinopathy without macular edema: Secondary | ICD-10-CM | POA: Diagnosis not present

## 2019-03-05 DIAGNOSIS — G309 Alzheimer's disease, unspecified: Secondary | ICD-10-CM | POA: Diagnosis not present

## 2019-03-05 DIAGNOSIS — J449 Chronic obstructive pulmonary disease, unspecified: Secondary | ICD-10-CM | POA: Diagnosis not present

## 2019-03-05 DIAGNOSIS — Z8673 Personal history of transient ischemic attack (TIA), and cerebral infarction without residual deficits: Secondary | ICD-10-CM | POA: Diagnosis not present

## 2019-03-05 DIAGNOSIS — I10 Essential (primary) hypertension: Secondary | ICD-10-CM | POA: Diagnosis not present

## 2019-03-05 DIAGNOSIS — Z7902 Long term (current) use of antithrombotics/antiplatelets: Secondary | ICD-10-CM | POA: Diagnosis not present

## 2019-03-05 DIAGNOSIS — Z7982 Long term (current) use of aspirin: Secondary | ICD-10-CM | POA: Diagnosis not present

## 2019-03-12 DIAGNOSIS — I10 Essential (primary) hypertension: Secondary | ICD-10-CM | POA: Diagnosis not present

## 2019-03-12 DIAGNOSIS — J449 Chronic obstructive pulmonary disease, unspecified: Secondary | ICD-10-CM | POA: Diagnosis not present

## 2019-03-12 DIAGNOSIS — E785 Hyperlipidemia, unspecified: Secondary | ICD-10-CM | POA: Diagnosis not present

## 2019-03-12 DIAGNOSIS — Z8673 Personal history of transient ischemic attack (TIA), and cerebral infarction without residual deficits: Secondary | ICD-10-CM | POA: Diagnosis not present

## 2019-03-12 DIAGNOSIS — Z794 Long term (current) use of insulin: Secondary | ICD-10-CM | POA: Diagnosis not present

## 2019-03-12 DIAGNOSIS — I679 Cerebrovascular disease, unspecified: Secondary | ICD-10-CM | POA: Diagnosis not present

## 2019-03-12 DIAGNOSIS — E11319 Type 2 diabetes mellitus with unspecified diabetic retinopathy without macular edema: Secondary | ICD-10-CM | POA: Diagnosis not present

## 2019-03-12 DIAGNOSIS — Z7902 Long term (current) use of antithrombotics/antiplatelets: Secondary | ICD-10-CM | POA: Diagnosis not present

## 2019-03-12 DIAGNOSIS — G309 Alzheimer's disease, unspecified: Secondary | ICD-10-CM | POA: Diagnosis not present

## 2019-03-12 DIAGNOSIS — Z7982 Long term (current) use of aspirin: Secondary | ICD-10-CM | POA: Diagnosis not present

## 2019-03-16 DIAGNOSIS — I10 Essential (primary) hypertension: Secondary | ICD-10-CM | POA: Diagnosis not present

## 2019-03-16 DIAGNOSIS — J449 Chronic obstructive pulmonary disease, unspecified: Secondary | ICD-10-CM | POA: Diagnosis not present

## 2019-03-16 DIAGNOSIS — E11319 Type 2 diabetes mellitus with unspecified diabetic retinopathy without macular edema: Secondary | ICD-10-CM | POA: Diagnosis not present

## 2019-03-16 DIAGNOSIS — E785 Hyperlipidemia, unspecified: Secondary | ICD-10-CM | POA: Diagnosis not present

## 2019-03-17 DIAGNOSIS — G309 Alzheimer's disease, unspecified: Secondary | ICD-10-CM | POA: Diagnosis not present

## 2019-03-17 DIAGNOSIS — Z794 Long term (current) use of insulin: Secondary | ICD-10-CM | POA: Diagnosis not present

## 2019-03-17 DIAGNOSIS — I679 Cerebrovascular disease, unspecified: Secondary | ICD-10-CM | POA: Diagnosis not present

## 2019-03-17 DIAGNOSIS — E11319 Type 2 diabetes mellitus with unspecified diabetic retinopathy without macular edema: Secondary | ICD-10-CM | POA: Diagnosis not present

## 2019-03-17 DIAGNOSIS — Z7982 Long term (current) use of aspirin: Secondary | ICD-10-CM | POA: Diagnosis not present

## 2019-03-17 DIAGNOSIS — I10 Essential (primary) hypertension: Secondary | ICD-10-CM | POA: Diagnosis not present

## 2019-03-17 DIAGNOSIS — Z7902 Long term (current) use of antithrombotics/antiplatelets: Secondary | ICD-10-CM | POA: Diagnosis not present

## 2019-03-17 DIAGNOSIS — E785 Hyperlipidemia, unspecified: Secondary | ICD-10-CM | POA: Diagnosis not present

## 2019-03-17 DIAGNOSIS — Z8673 Personal history of transient ischemic attack (TIA), and cerebral infarction without residual deficits: Secondary | ICD-10-CM | POA: Diagnosis not present

## 2019-03-17 DIAGNOSIS — J449 Chronic obstructive pulmonary disease, unspecified: Secondary | ICD-10-CM | POA: Diagnosis not present

## 2019-04-06 DIAGNOSIS — E038 Other specified hypothyroidism: Secondary | ICD-10-CM | POA: Diagnosis not present

## 2019-04-06 DIAGNOSIS — E119 Type 2 diabetes mellitus without complications: Secondary | ICD-10-CM | POA: Diagnosis not present

## 2019-04-06 DIAGNOSIS — D518 Other vitamin B12 deficiency anemias: Secondary | ICD-10-CM | POA: Diagnosis not present

## 2019-04-13 DIAGNOSIS — E11319 Type 2 diabetes mellitus with unspecified diabetic retinopathy without macular edema: Secondary | ICD-10-CM | POA: Diagnosis not present

## 2019-04-13 DIAGNOSIS — J449 Chronic obstructive pulmonary disease, unspecified: Secondary | ICD-10-CM | POA: Diagnosis not present

## 2019-04-13 DIAGNOSIS — E785 Hyperlipidemia, unspecified: Secondary | ICD-10-CM | POA: Diagnosis not present

## 2019-04-13 DIAGNOSIS — I10 Essential (primary) hypertension: Secondary | ICD-10-CM | POA: Diagnosis not present

## 2019-05-05 DIAGNOSIS — E119 Type 2 diabetes mellitus without complications: Secondary | ICD-10-CM | POA: Diagnosis not present

## 2019-05-05 DIAGNOSIS — D518 Other vitamin B12 deficiency anemias: Secondary | ICD-10-CM | POA: Diagnosis not present

## 2019-05-05 DIAGNOSIS — E038 Other specified hypothyroidism: Secondary | ICD-10-CM | POA: Diagnosis not present

## 2019-05-11 DIAGNOSIS — Z79899 Other long term (current) drug therapy: Secondary | ICD-10-CM | POA: Diagnosis not present

## 2019-05-11 DIAGNOSIS — E038 Other specified hypothyroidism: Secondary | ICD-10-CM | POA: Diagnosis not present

## 2019-05-11 DIAGNOSIS — J449 Chronic obstructive pulmonary disease, unspecified: Secondary | ICD-10-CM | POA: Diagnosis not present

## 2019-05-11 DIAGNOSIS — D518 Other vitamin B12 deficiency anemias: Secondary | ICD-10-CM | POA: Diagnosis not present

## 2019-05-11 DIAGNOSIS — I10 Essential (primary) hypertension: Secondary | ICD-10-CM | POA: Diagnosis not present

## 2019-05-11 DIAGNOSIS — E559 Vitamin D deficiency, unspecified: Secondary | ICD-10-CM | POA: Diagnosis not present

## 2019-05-11 DIAGNOSIS — E11319 Type 2 diabetes mellitus with unspecified diabetic retinopathy without macular edema: Secondary | ICD-10-CM | POA: Diagnosis not present

## 2019-05-11 DIAGNOSIS — E119 Type 2 diabetes mellitus without complications: Secondary | ICD-10-CM | POA: Diagnosis not present

## 2019-05-11 DIAGNOSIS — E785 Hyperlipidemia, unspecified: Secondary | ICD-10-CM | POA: Diagnosis not present

## 2019-05-11 DIAGNOSIS — E7849 Other hyperlipidemia: Secondary | ICD-10-CM | POA: Diagnosis not present

## 2019-06-08 DIAGNOSIS — E11319 Type 2 diabetes mellitus with unspecified diabetic retinopathy without macular edema: Secondary | ICD-10-CM | POA: Diagnosis not present

## 2019-06-08 DIAGNOSIS — E785 Hyperlipidemia, unspecified: Secondary | ICD-10-CM | POA: Diagnosis not present

## 2019-06-08 DIAGNOSIS — J449 Chronic obstructive pulmonary disease, unspecified: Secondary | ICD-10-CM | POA: Diagnosis not present

## 2019-06-08 DIAGNOSIS — I10 Essential (primary) hypertension: Secondary | ICD-10-CM | POA: Diagnosis not present

## 2019-06-13 DIAGNOSIS — E038 Other specified hypothyroidism: Secondary | ICD-10-CM | POA: Diagnosis not present

## 2019-06-13 DIAGNOSIS — D518 Other vitamin B12 deficiency anemias: Secondary | ICD-10-CM | POA: Diagnosis not present

## 2019-06-13 DIAGNOSIS — E119 Type 2 diabetes mellitus without complications: Secondary | ICD-10-CM | POA: Diagnosis not present

## 2019-06-28 DIAGNOSIS — E11319 Type 2 diabetes mellitus with unspecified diabetic retinopathy without macular edema: Secondary | ICD-10-CM | POA: Diagnosis not present

## 2019-06-28 DIAGNOSIS — B351 Tinea unguium: Secondary | ICD-10-CM | POA: Diagnosis not present

## 2019-07-07 DIAGNOSIS — D518 Other vitamin B12 deficiency anemias: Secondary | ICD-10-CM | POA: Diagnosis not present

## 2019-07-07 DIAGNOSIS — E119 Type 2 diabetes mellitus without complications: Secondary | ICD-10-CM | POA: Diagnosis not present

## 2019-07-07 DIAGNOSIS — E038 Other specified hypothyroidism: Secondary | ICD-10-CM | POA: Diagnosis not present

## 2019-07-13 DIAGNOSIS — I10 Essential (primary) hypertension: Secondary | ICD-10-CM | POA: Diagnosis not present

## 2019-07-13 DIAGNOSIS — E785 Hyperlipidemia, unspecified: Secondary | ICD-10-CM | POA: Diagnosis not present

## 2019-07-13 DIAGNOSIS — E11319 Type 2 diabetes mellitus with unspecified diabetic retinopathy without macular edema: Secondary | ICD-10-CM | POA: Diagnosis not present

## 2019-07-13 DIAGNOSIS — J449 Chronic obstructive pulmonary disease, unspecified: Secondary | ICD-10-CM | POA: Diagnosis not present

## 2019-08-04 DIAGNOSIS — E038 Other specified hypothyroidism: Secondary | ICD-10-CM | POA: Diagnosis not present

## 2019-08-04 DIAGNOSIS — E119 Type 2 diabetes mellitus without complications: Secondary | ICD-10-CM | POA: Diagnosis not present

## 2019-08-04 DIAGNOSIS — D518 Other vitamin B12 deficiency anemias: Secondary | ICD-10-CM | POA: Diagnosis not present

## 2019-08-10 DIAGNOSIS — J449 Chronic obstructive pulmonary disease, unspecified: Secondary | ICD-10-CM | POA: Diagnosis not present

## 2019-08-10 DIAGNOSIS — E785 Hyperlipidemia, unspecified: Secondary | ICD-10-CM | POA: Diagnosis not present

## 2019-08-10 DIAGNOSIS — Z79899 Other long term (current) drug therapy: Secondary | ICD-10-CM | POA: Diagnosis not present

## 2019-08-10 DIAGNOSIS — E119 Type 2 diabetes mellitus without complications: Secondary | ICD-10-CM | POA: Diagnosis not present

## 2019-08-10 DIAGNOSIS — D518 Other vitamin B12 deficiency anemias: Secondary | ICD-10-CM | POA: Diagnosis not present

## 2019-08-10 DIAGNOSIS — E038 Other specified hypothyroidism: Secondary | ICD-10-CM | POA: Diagnosis not present

## 2019-08-10 DIAGNOSIS — I10 Essential (primary) hypertension: Secondary | ICD-10-CM | POA: Diagnosis not present

## 2019-08-10 DIAGNOSIS — E11319 Type 2 diabetes mellitus with unspecified diabetic retinopathy without macular edema: Secondary | ICD-10-CM | POA: Diagnosis not present

## 2019-08-10 DIAGNOSIS — E7849 Other hyperlipidemia: Secondary | ICD-10-CM | POA: Diagnosis not present

## 2019-08-10 DIAGNOSIS — E559 Vitamin D deficiency, unspecified: Secondary | ICD-10-CM | POA: Diagnosis not present

## 2019-08-20 DIAGNOSIS — J449 Chronic obstructive pulmonary disease, unspecified: Secondary | ICD-10-CM | POA: Diagnosis not present

## 2019-08-24 ENCOUNTER — Other Ambulatory Visit: Payer: Self-pay

## 2019-08-24 NOTE — Patient Outreach (Signed)
[place] [facility]) Care Management  08/24/2019  [name] Mar 02, 1935 [id number]   Medication Adherence call to Jasmine Arias HIPPA Compliant Voice message left with a call back number. Jasmine Arias is showing past due on Atorvastatin 40 mg and Lisinopril 40 mg under [place].   [place] Management Direct Dial [phone]  Fax [phone] [email or web address]

## 2019-09-01 DIAGNOSIS — E119 Type 2 diabetes mellitus without complications: Secondary | ICD-10-CM | POA: Diagnosis not present

## 2019-09-01 DIAGNOSIS — E038 Other specified hypothyroidism: Secondary | ICD-10-CM | POA: Diagnosis not present

## 2019-09-01 DIAGNOSIS — E559 Vitamin D deficiency, unspecified: Secondary | ICD-10-CM | POA: Diagnosis not present

## 2019-09-01 DIAGNOSIS — D518 Other vitamin B12 deficiency anemias: Secondary | ICD-10-CM | POA: Diagnosis not present

## 2019-09-21 DIAGNOSIS — I10 Essential (primary) hypertension: Secondary | ICD-10-CM | POA: Diagnosis not present

## 2019-09-21 DIAGNOSIS — E785 Hyperlipidemia, unspecified: Secondary | ICD-10-CM | POA: Diagnosis not present

## 2019-09-21 DIAGNOSIS — J449 Chronic obstructive pulmonary disease, unspecified: Secondary | ICD-10-CM | POA: Diagnosis not present

## 2019-09-21 DIAGNOSIS — E11319 Type 2 diabetes mellitus with unspecified diabetic retinopathy without macular edema: Secondary | ICD-10-CM | POA: Diagnosis not present

## 2019-09-22 DIAGNOSIS — J449 Chronic obstructive pulmonary disease, unspecified: Secondary | ICD-10-CM | POA: Diagnosis not present

## 2019-10-07 DIAGNOSIS — E038 Other specified hypothyroidism: Secondary | ICD-10-CM | POA: Diagnosis not present

## 2019-10-07 DIAGNOSIS — E119 Type 2 diabetes mellitus without complications: Secondary | ICD-10-CM | POA: Diagnosis not present

## 2019-10-07 DIAGNOSIS — D518 Other vitamin B12 deficiency anemias: Secondary | ICD-10-CM | POA: Diagnosis not present

## 2019-10-07 DIAGNOSIS — E559 Vitamin D deficiency, unspecified: Secondary | ICD-10-CM | POA: Diagnosis not present

## 2019-10-08 DIAGNOSIS — U071 COVID-19: Secondary | ICD-10-CM | POA: Diagnosis not present

## 2019-10-15 DIAGNOSIS — U071 COVID-19: Secondary | ICD-10-CM | POA: Diagnosis not present

## 2019-10-22 DIAGNOSIS — U071 COVID-19: Secondary | ICD-10-CM | POA: Diagnosis not present

## 2019-10-22 DIAGNOSIS — J449 Chronic obstructive pulmonary disease, unspecified: Secondary | ICD-10-CM | POA: Diagnosis not present

## 2019-10-29 DIAGNOSIS — U071 COVID-19: Secondary | ICD-10-CM | POA: Diagnosis not present

## 2019-11-01 DIAGNOSIS — E119 Type 2 diabetes mellitus without complications: Secondary | ICD-10-CM | POA: Diagnosis not present

## 2019-11-01 DIAGNOSIS — E038 Other specified hypothyroidism: Secondary | ICD-10-CM | POA: Diagnosis not present

## 2019-11-01 DIAGNOSIS — D518 Other vitamin B12 deficiency anemias: Secondary | ICD-10-CM | POA: Diagnosis not present

## 2019-11-01 DIAGNOSIS — E559 Vitamin D deficiency, unspecified: Secondary | ICD-10-CM | POA: Diagnosis not present

## 2019-11-05 DIAGNOSIS — U071 COVID-19: Secondary | ICD-10-CM | POA: Diagnosis not present

## 2019-11-10 DIAGNOSIS — Z79899 Other long term (current) drug therapy: Secondary | ICD-10-CM | POA: Diagnosis not present

## 2019-11-10 DIAGNOSIS — E7849 Other hyperlipidemia: Secondary | ICD-10-CM | POA: Diagnosis not present

## 2019-11-10 DIAGNOSIS — D518 Other vitamin B12 deficiency anemias: Secondary | ICD-10-CM | POA: Diagnosis not present

## 2019-11-10 DIAGNOSIS — E559 Vitamin D deficiency, unspecified: Secondary | ICD-10-CM | POA: Diagnosis not present

## 2019-11-10 DIAGNOSIS — E038 Other specified hypothyroidism: Secondary | ICD-10-CM | POA: Diagnosis not present

## 2019-11-10 DIAGNOSIS — E119 Type 2 diabetes mellitus without complications: Secondary | ICD-10-CM | POA: Diagnosis not present

## 2019-11-19 DIAGNOSIS — U071 COVID-19: Secondary | ICD-10-CM | POA: Diagnosis not present

## 2019-11-22 ENCOUNTER — Other Ambulatory Visit: Payer: Self-pay

## 2019-11-22 NOTE — Patient Outreach (Signed)
[place]) Care Management  11/22/2019  STAYCE KOCA 11-23-1934 [id number]   Medication Adherence call to Mrs. [name] HIPPA Compliant Voice message left with a call back number. Mrs. Bickell is showing past due on Atorvastatin 40 mg and Lisinopril 40 mg under [place].   [place] Management Direct Dial [phone]  Fax [phone] [email or web address]

## 2019-11-23 DIAGNOSIS — J449 Chronic obstructive pulmonary disease, unspecified: Secondary | ICD-10-CM | POA: Diagnosis not present

## 2019-11-26 DIAGNOSIS — U071 COVID-19: Secondary | ICD-10-CM | POA: Diagnosis not present

## 2019-11-30 DIAGNOSIS — J449 Chronic obstructive pulmonary disease, unspecified: Secondary | ICD-10-CM | POA: Diagnosis not present

## 2019-11-30 DIAGNOSIS — E11319 Type 2 diabetes mellitus with unspecified diabetic retinopathy without macular edema: Secondary | ICD-10-CM | POA: Diagnosis not present

## 2019-11-30 DIAGNOSIS — E785 Hyperlipidemia, unspecified: Secondary | ICD-10-CM | POA: Diagnosis not present

## 2019-11-30 DIAGNOSIS — I10 Essential (primary) hypertension: Secondary | ICD-10-CM | POA: Diagnosis not present

## 2019-12-01 DIAGNOSIS — N390 Urinary tract infection, site not specified: Secondary | ICD-10-CM | POA: Diagnosis not present

## 2019-12-02 DIAGNOSIS — E11319 Type 2 diabetes mellitus with unspecified diabetic retinopathy without macular edema: Secondary | ICD-10-CM | POA: Diagnosis not present

## 2019-12-02 DIAGNOSIS — M6281 Muscle weakness (generalized): Secondary | ICD-10-CM | POA: Diagnosis not present

## 2019-12-02 DIAGNOSIS — J449 Chronic obstructive pulmonary disease, unspecified: Secondary | ICD-10-CM | POA: Diagnosis not present

## 2019-12-02 DIAGNOSIS — G309 Alzheimer's disease, unspecified: Secondary | ICD-10-CM | POA: Diagnosis not present

## 2019-12-02 DIAGNOSIS — I10 Essential (primary) hypertension: Secondary | ICD-10-CM | POA: Diagnosis not present

## 2019-12-02 DIAGNOSIS — R2681 Unsteadiness on feet: Secondary | ICD-10-CM | POA: Diagnosis not present

## 2019-12-02 DIAGNOSIS — E785 Hyperlipidemia, unspecified: Secondary | ICD-10-CM | POA: Diagnosis not present

## 2019-12-02 DIAGNOSIS — Z794 Long term (current) use of insulin: Secondary | ICD-10-CM | POA: Diagnosis not present

## 2019-12-03 DIAGNOSIS — U071 COVID-19: Secondary | ICD-10-CM | POA: Diagnosis not present

## 2019-12-06 DIAGNOSIS — Z794 Long term (current) use of insulin: Secondary | ICD-10-CM | POA: Diagnosis not present

## 2019-12-06 DIAGNOSIS — M6281 Muscle weakness (generalized): Secondary | ICD-10-CM | POA: Diagnosis not present

## 2019-12-06 DIAGNOSIS — E785 Hyperlipidemia, unspecified: Secondary | ICD-10-CM | POA: Diagnosis not present

## 2019-12-06 DIAGNOSIS — J449 Chronic obstructive pulmonary disease, unspecified: Secondary | ICD-10-CM | POA: Diagnosis not present

## 2019-12-06 DIAGNOSIS — I10 Essential (primary) hypertension: Secondary | ICD-10-CM | POA: Diagnosis not present

## 2019-12-06 DIAGNOSIS — G309 Alzheimer's disease, unspecified: Secondary | ICD-10-CM | POA: Diagnosis not present

## 2019-12-06 DIAGNOSIS — R2681 Unsteadiness on feet: Secondary | ICD-10-CM | POA: Diagnosis not present

## 2019-12-06 DIAGNOSIS — E11319 Type 2 diabetes mellitus with unspecified diabetic retinopathy without macular edema: Secondary | ICD-10-CM | POA: Diagnosis not present

## 2019-12-08 DIAGNOSIS — Z794 Long term (current) use of insulin: Secondary | ICD-10-CM | POA: Diagnosis not present

## 2019-12-08 DIAGNOSIS — J449 Chronic obstructive pulmonary disease, unspecified: Secondary | ICD-10-CM | POA: Diagnosis not present

## 2019-12-08 DIAGNOSIS — I10 Essential (primary) hypertension: Secondary | ICD-10-CM | POA: Diagnosis not present

## 2019-12-08 DIAGNOSIS — R2681 Unsteadiness on feet: Secondary | ICD-10-CM | POA: Diagnosis not present

## 2019-12-08 DIAGNOSIS — M6281 Muscle weakness (generalized): Secondary | ICD-10-CM | POA: Diagnosis not present

## 2019-12-08 DIAGNOSIS — E785 Hyperlipidemia, unspecified: Secondary | ICD-10-CM | POA: Diagnosis not present

## 2019-12-08 DIAGNOSIS — E11319 Type 2 diabetes mellitus with unspecified diabetic retinopathy without macular edema: Secondary | ICD-10-CM | POA: Diagnosis not present

## 2019-12-08 DIAGNOSIS — G309 Alzheimer's disease, unspecified: Secondary | ICD-10-CM | POA: Diagnosis not present

## 2019-12-10 DIAGNOSIS — U071 COVID-19: Secondary | ICD-10-CM | POA: Diagnosis not present

## 2019-12-13 DIAGNOSIS — R2681 Unsteadiness on feet: Secondary | ICD-10-CM | POA: Diagnosis not present

## 2019-12-13 DIAGNOSIS — J449 Chronic obstructive pulmonary disease, unspecified: Secondary | ICD-10-CM | POA: Diagnosis not present

## 2019-12-13 DIAGNOSIS — Z794 Long term (current) use of insulin: Secondary | ICD-10-CM | POA: Diagnosis not present

## 2019-12-13 DIAGNOSIS — G309 Alzheimer's disease, unspecified: Secondary | ICD-10-CM | POA: Diagnosis not present

## 2019-12-13 DIAGNOSIS — I10 Essential (primary) hypertension: Secondary | ICD-10-CM | POA: Diagnosis not present

## 2019-12-13 DIAGNOSIS — E785 Hyperlipidemia, unspecified: Secondary | ICD-10-CM | POA: Diagnosis not present

## 2019-12-13 DIAGNOSIS — M6281 Muscle weakness (generalized): Secondary | ICD-10-CM | POA: Diagnosis not present

## 2019-12-13 DIAGNOSIS — E11319 Type 2 diabetes mellitus with unspecified diabetic retinopathy without macular edema: Secondary | ICD-10-CM | POA: Diagnosis not present

## 2019-12-17 DIAGNOSIS — G309 Alzheimer's disease, unspecified: Secondary | ICD-10-CM | POA: Diagnosis not present

## 2019-12-17 DIAGNOSIS — E11319 Type 2 diabetes mellitus with unspecified diabetic retinopathy without macular edema: Secondary | ICD-10-CM | POA: Diagnosis not present

## 2019-12-17 DIAGNOSIS — J449 Chronic obstructive pulmonary disease, unspecified: Secondary | ICD-10-CM | POA: Diagnosis not present

## 2019-12-17 DIAGNOSIS — U071 COVID-19: Secondary | ICD-10-CM | POA: Diagnosis not present

## 2019-12-17 DIAGNOSIS — R2681 Unsteadiness on feet: Secondary | ICD-10-CM | POA: Diagnosis not present

## 2019-12-17 DIAGNOSIS — E785 Hyperlipidemia, unspecified: Secondary | ICD-10-CM | POA: Diagnosis not present

## 2019-12-17 DIAGNOSIS — Z794 Long term (current) use of insulin: Secondary | ICD-10-CM | POA: Diagnosis not present

## 2019-12-17 DIAGNOSIS — M6281 Muscle weakness (generalized): Secondary | ICD-10-CM | POA: Diagnosis not present

## 2019-12-17 DIAGNOSIS — I10 Essential (primary) hypertension: Secondary | ICD-10-CM | POA: Diagnosis not present

## 2019-12-20 DIAGNOSIS — E11319 Type 2 diabetes mellitus with unspecified diabetic retinopathy without macular edema: Secondary | ICD-10-CM | POA: Diagnosis not present

## 2019-12-20 DIAGNOSIS — Z794 Long term (current) use of insulin: Secondary | ICD-10-CM | POA: Diagnosis not present

## 2019-12-20 DIAGNOSIS — M6281 Muscle weakness (generalized): Secondary | ICD-10-CM | POA: Diagnosis not present

## 2019-12-20 DIAGNOSIS — I10 Essential (primary) hypertension: Secondary | ICD-10-CM | POA: Diagnosis not present

## 2019-12-20 DIAGNOSIS — J449 Chronic obstructive pulmonary disease, unspecified: Secondary | ICD-10-CM | POA: Diagnosis not present

## 2019-12-20 DIAGNOSIS — R2681 Unsteadiness on feet: Secondary | ICD-10-CM | POA: Diagnosis not present

## 2019-12-20 DIAGNOSIS — E785 Hyperlipidemia, unspecified: Secondary | ICD-10-CM | POA: Diagnosis not present

## 2019-12-20 DIAGNOSIS — G309 Alzheimer's disease, unspecified: Secondary | ICD-10-CM | POA: Diagnosis not present

## 2019-12-22 DIAGNOSIS — G309 Alzheimer's disease, unspecified: Secondary | ICD-10-CM | POA: Diagnosis not present

## 2019-12-22 DIAGNOSIS — E11319 Type 2 diabetes mellitus with unspecified diabetic retinopathy without macular edema: Secondary | ICD-10-CM | POA: Diagnosis not present

## 2019-12-24 DIAGNOSIS — J449 Chronic obstructive pulmonary disease, unspecified: Secondary | ICD-10-CM | POA: Diagnosis not present

## 2019-12-27 DIAGNOSIS — J449 Chronic obstructive pulmonary disease, unspecified: Secondary | ICD-10-CM | POA: Diagnosis not present

## 2019-12-27 DIAGNOSIS — R2681 Unsteadiness on feet: Secondary | ICD-10-CM | POA: Diagnosis not present

## 2019-12-27 DIAGNOSIS — E785 Hyperlipidemia, unspecified: Secondary | ICD-10-CM | POA: Diagnosis not present

## 2019-12-27 DIAGNOSIS — E11319 Type 2 diabetes mellitus with unspecified diabetic retinopathy without macular edema: Secondary | ICD-10-CM | POA: Diagnosis not present

## 2019-12-27 DIAGNOSIS — I10 Essential (primary) hypertension: Secondary | ICD-10-CM | POA: Diagnosis not present

## 2019-12-27 DIAGNOSIS — G309 Alzheimer's disease, unspecified: Secondary | ICD-10-CM | POA: Diagnosis not present

## 2019-12-27 DIAGNOSIS — M6281 Muscle weakness (generalized): Secondary | ICD-10-CM | POA: Diagnosis not present

## 2019-12-27 DIAGNOSIS — Z794 Long term (current) use of insulin: Secondary | ICD-10-CM | POA: Diagnosis not present

## 2019-12-28 DIAGNOSIS — I10 Essential (primary) hypertension: Secondary | ICD-10-CM | POA: Diagnosis not present

## 2019-12-28 DIAGNOSIS — J449 Chronic obstructive pulmonary disease, unspecified: Secondary | ICD-10-CM | POA: Diagnosis not present

## 2019-12-28 DIAGNOSIS — E038 Other specified hypothyroidism: Secondary | ICD-10-CM | POA: Diagnosis not present

## 2019-12-28 DIAGNOSIS — E119 Type 2 diabetes mellitus without complications: Secondary | ICD-10-CM | POA: Diagnosis not present

## 2019-12-28 DIAGNOSIS — E11319 Type 2 diabetes mellitus with unspecified diabetic retinopathy without macular edema: Secondary | ICD-10-CM | POA: Diagnosis not present

## 2019-12-28 DIAGNOSIS — D518 Other vitamin B12 deficiency anemias: Secondary | ICD-10-CM | POA: Diagnosis not present

## 2019-12-28 DIAGNOSIS — E785 Hyperlipidemia, unspecified: Secondary | ICD-10-CM | POA: Diagnosis not present

## 2019-12-28 DIAGNOSIS — E559 Vitamin D deficiency, unspecified: Secondary | ICD-10-CM | POA: Diagnosis not present

## 2020-01-03 DIAGNOSIS — J449 Chronic obstructive pulmonary disease, unspecified: Secondary | ICD-10-CM | POA: Diagnosis not present

## 2020-01-03 DIAGNOSIS — E11319 Type 2 diabetes mellitus with unspecified diabetic retinopathy without macular edema: Secondary | ICD-10-CM | POA: Diagnosis not present

## 2020-01-03 DIAGNOSIS — I10 Essential (primary) hypertension: Secondary | ICD-10-CM | POA: Diagnosis not present

## 2020-01-03 DIAGNOSIS — G309 Alzheimer's disease, unspecified: Secondary | ICD-10-CM | POA: Diagnosis not present

## 2020-01-03 DIAGNOSIS — E785 Hyperlipidemia, unspecified: Secondary | ICD-10-CM | POA: Diagnosis not present

## 2020-01-03 DIAGNOSIS — Z794 Long term (current) use of insulin: Secondary | ICD-10-CM | POA: Diagnosis not present

## 2020-01-03 DIAGNOSIS — R2681 Unsteadiness on feet: Secondary | ICD-10-CM | POA: Diagnosis not present

## 2020-01-03 DIAGNOSIS — M6281 Muscle weakness (generalized): Secondary | ICD-10-CM | POA: Diagnosis not present

## 2020-01-12 DIAGNOSIS — E785 Hyperlipidemia, unspecified: Secondary | ICD-10-CM | POA: Diagnosis not present

## 2020-01-12 DIAGNOSIS — E038 Other specified hypothyroidism: Secondary | ICD-10-CM | POA: Diagnosis not present

## 2020-01-12 DIAGNOSIS — D518 Other vitamin B12 deficiency anemias: Secondary | ICD-10-CM | POA: Diagnosis not present

## 2020-01-12 DIAGNOSIS — G309 Alzheimer's disease, unspecified: Secondary | ICD-10-CM | POA: Diagnosis not present

## 2020-01-12 DIAGNOSIS — Z794 Long term (current) use of insulin: Secondary | ICD-10-CM | POA: Diagnosis not present

## 2020-01-12 DIAGNOSIS — M6281 Muscle weakness (generalized): Secondary | ICD-10-CM | POA: Diagnosis not present

## 2020-01-12 DIAGNOSIS — R2681 Unsteadiness on feet: Secondary | ICD-10-CM | POA: Diagnosis not present

## 2020-01-12 DIAGNOSIS — E11319 Type 2 diabetes mellitus with unspecified diabetic retinopathy without macular edema: Secondary | ICD-10-CM | POA: Diagnosis not present

## 2020-01-12 DIAGNOSIS — J449 Chronic obstructive pulmonary disease, unspecified: Secondary | ICD-10-CM | POA: Diagnosis not present

## 2020-01-12 DIAGNOSIS — E119 Type 2 diabetes mellitus without complications: Secondary | ICD-10-CM | POA: Diagnosis not present

## 2020-01-12 DIAGNOSIS — I10 Essential (primary) hypertension: Secondary | ICD-10-CM | POA: Diagnosis not present

## 2020-01-12 DIAGNOSIS — E559 Vitamin D deficiency, unspecified: Secondary | ICD-10-CM | POA: Diagnosis not present

## 2020-01-17 DIAGNOSIS — E11319 Type 2 diabetes mellitus with unspecified diabetic retinopathy without macular edema: Secondary | ICD-10-CM | POA: Diagnosis not present

## 2020-01-17 DIAGNOSIS — J449 Chronic obstructive pulmonary disease, unspecified: Secondary | ICD-10-CM | POA: Diagnosis not present

## 2020-01-17 DIAGNOSIS — Z794 Long term (current) use of insulin: Secondary | ICD-10-CM | POA: Diagnosis not present

## 2020-01-17 DIAGNOSIS — R2681 Unsteadiness on feet: Secondary | ICD-10-CM | POA: Diagnosis not present

## 2020-01-17 DIAGNOSIS — I10 Essential (primary) hypertension: Secondary | ICD-10-CM | POA: Diagnosis not present

## 2020-01-17 DIAGNOSIS — G309 Alzheimer's disease, unspecified: Secondary | ICD-10-CM | POA: Diagnosis not present

## 2020-01-17 DIAGNOSIS — M6281 Muscle weakness (generalized): Secondary | ICD-10-CM | POA: Diagnosis not present

## 2020-01-17 DIAGNOSIS — E785 Hyperlipidemia, unspecified: Secondary | ICD-10-CM | POA: Diagnosis not present

## 2020-01-25 DIAGNOSIS — G309 Alzheimer's disease, unspecified: Secondary | ICD-10-CM | POA: Diagnosis not present

## 2020-01-25 DIAGNOSIS — E11319 Type 2 diabetes mellitus with unspecified diabetic retinopathy without macular edema: Secondary | ICD-10-CM | POA: Diagnosis not present

## 2020-01-25 DIAGNOSIS — E785 Hyperlipidemia, unspecified: Secondary | ICD-10-CM | POA: Diagnosis not present

## 2020-01-25 DIAGNOSIS — J449 Chronic obstructive pulmonary disease, unspecified: Secondary | ICD-10-CM | POA: Diagnosis not present

## 2020-02-03 DIAGNOSIS — B351 Tinea unguium: Secondary | ICD-10-CM | POA: Diagnosis not present

## 2020-02-07 DIAGNOSIS — E559 Vitamin D deficiency, unspecified: Secondary | ICD-10-CM | POA: Diagnosis not present

## 2020-02-07 DIAGNOSIS — D518 Other vitamin B12 deficiency anemias: Secondary | ICD-10-CM | POA: Diagnosis not present

## 2020-02-07 DIAGNOSIS — E119 Type 2 diabetes mellitus without complications: Secondary | ICD-10-CM | POA: Diagnosis not present

## 2020-02-07 DIAGNOSIS — E038 Other specified hypothyroidism: Secondary | ICD-10-CM | POA: Diagnosis not present

## 2020-02-08 DIAGNOSIS — Z79899 Other long term (current) drug therapy: Secondary | ICD-10-CM | POA: Diagnosis not present

## 2020-02-08 DIAGNOSIS — E038 Other specified hypothyroidism: Secondary | ICD-10-CM | POA: Diagnosis not present

## 2020-02-08 DIAGNOSIS — E7849 Other hyperlipidemia: Secondary | ICD-10-CM | POA: Diagnosis not present

## 2020-02-08 DIAGNOSIS — E119 Type 2 diabetes mellitus without complications: Secondary | ICD-10-CM | POA: Diagnosis not present

## 2020-02-08 DIAGNOSIS — E559 Vitamin D deficiency, unspecified: Secondary | ICD-10-CM | POA: Diagnosis not present

## 2020-02-08 DIAGNOSIS — D518 Other vitamin B12 deficiency anemias: Secondary | ICD-10-CM | POA: Diagnosis not present

## 2020-02-22 DIAGNOSIS — I10 Essential (primary) hypertension: Secondary | ICD-10-CM | POA: Diagnosis not present

## 2020-02-22 DIAGNOSIS — E11319 Type 2 diabetes mellitus with unspecified diabetic retinopathy without macular edema: Secondary | ICD-10-CM | POA: Diagnosis not present

## 2020-02-22 DIAGNOSIS — J449 Chronic obstructive pulmonary disease, unspecified: Secondary | ICD-10-CM | POA: Diagnosis not present

## 2020-02-22 DIAGNOSIS — E785 Hyperlipidemia, unspecified: Secondary | ICD-10-CM | POA: Diagnosis not present

## 2020-03-06 DIAGNOSIS — E559 Vitamin D deficiency, unspecified: Secondary | ICD-10-CM | POA: Diagnosis not present

## 2020-03-06 DIAGNOSIS — D518 Other vitamin B12 deficiency anemias: Secondary | ICD-10-CM | POA: Diagnosis not present

## 2020-03-06 DIAGNOSIS — E119 Type 2 diabetes mellitus without complications: Secondary | ICD-10-CM | POA: Diagnosis not present

## 2020-03-06 DIAGNOSIS — E11319 Type 2 diabetes mellitus with unspecified diabetic retinopathy without macular edema: Secondary | ICD-10-CM | POA: Diagnosis not present

## 2020-03-06 DIAGNOSIS — E038 Other specified hypothyroidism: Secondary | ICD-10-CM | POA: Diagnosis not present

## 2020-03-07 DIAGNOSIS — E119 Type 2 diabetes mellitus without complications: Secondary | ICD-10-CM | POA: Diagnosis not present

## 2020-03-07 DIAGNOSIS — H2513 Age-related nuclear cataract, bilateral: Secondary | ICD-10-CM | POA: Diagnosis not present

## 2020-03-21 DIAGNOSIS — E785 Hyperlipidemia, unspecified: Secondary | ICD-10-CM | POA: Diagnosis not present

## 2020-03-21 DIAGNOSIS — E11319 Type 2 diabetes mellitus with unspecified diabetic retinopathy without macular edema: Secondary | ICD-10-CM | POA: Diagnosis not present

## 2020-03-21 DIAGNOSIS — I10 Essential (primary) hypertension: Secondary | ICD-10-CM | POA: Diagnosis not present

## 2020-03-21 DIAGNOSIS — J449 Chronic obstructive pulmonary disease, unspecified: Secondary | ICD-10-CM | POA: Diagnosis not present

## 2020-03-22 DIAGNOSIS — J449 Chronic obstructive pulmonary disease, unspecified: Secondary | ICD-10-CM | POA: Diagnosis not present

## 2020-04-09 DIAGNOSIS — D518 Other vitamin B12 deficiency anemias: Secondary | ICD-10-CM | POA: Diagnosis not present

## 2020-04-09 DIAGNOSIS — E119 Type 2 diabetes mellitus without complications: Secondary | ICD-10-CM | POA: Diagnosis not present

## 2020-04-09 DIAGNOSIS — E559 Vitamin D deficiency, unspecified: Secondary | ICD-10-CM | POA: Diagnosis not present

## 2020-04-09 DIAGNOSIS — E11319 Type 2 diabetes mellitus with unspecified diabetic retinopathy without macular edema: Secondary | ICD-10-CM | POA: Diagnosis not present

## 2020-04-09 DIAGNOSIS — E038 Other specified hypothyroidism: Secondary | ICD-10-CM | POA: Diagnosis not present

## 2020-04-18 DIAGNOSIS — E785 Hyperlipidemia, unspecified: Secondary | ICD-10-CM | POA: Diagnosis not present

## 2020-04-18 DIAGNOSIS — I10 Essential (primary) hypertension: Secondary | ICD-10-CM | POA: Diagnosis not present

## 2020-04-18 DIAGNOSIS — E11319 Type 2 diabetes mellitus with unspecified diabetic retinopathy without macular edema: Secondary | ICD-10-CM | POA: Diagnosis not present

## 2020-04-25 DIAGNOSIS — J449 Chronic obstructive pulmonary disease, unspecified: Secondary | ICD-10-CM | POA: Diagnosis not present

## 2020-05-09 DIAGNOSIS — D518 Other vitamin B12 deficiency anemias: Secondary | ICD-10-CM | POA: Diagnosis not present

## 2020-05-09 DIAGNOSIS — Z79899 Other long term (current) drug therapy: Secondary | ICD-10-CM | POA: Diagnosis not present

## 2020-05-09 DIAGNOSIS — E119 Type 2 diabetes mellitus without complications: Secondary | ICD-10-CM | POA: Diagnosis not present

## 2020-05-09 DIAGNOSIS — E559 Vitamin D deficiency, unspecified: Secondary | ICD-10-CM | POA: Diagnosis not present

## 2020-05-09 DIAGNOSIS — E038 Other specified hypothyroidism: Secondary | ICD-10-CM | POA: Diagnosis not present

## 2020-05-13 DIAGNOSIS — E038 Other specified hypothyroidism: Secondary | ICD-10-CM | POA: Diagnosis not present

## 2020-05-13 DIAGNOSIS — E559 Vitamin D deficiency, unspecified: Secondary | ICD-10-CM | POA: Diagnosis not present

## 2020-05-13 DIAGNOSIS — E11319 Type 2 diabetes mellitus with unspecified diabetic retinopathy without macular edema: Secondary | ICD-10-CM | POA: Diagnosis not present

## 2020-05-13 DIAGNOSIS — D518 Other vitamin B12 deficiency anemias: Secondary | ICD-10-CM | POA: Diagnosis not present

## 2020-05-13 DIAGNOSIS — E119 Type 2 diabetes mellitus without complications: Secondary | ICD-10-CM | POA: Diagnosis not present

## 2020-05-16 DIAGNOSIS — J449 Chronic obstructive pulmonary disease, unspecified: Secondary | ICD-10-CM | POA: Diagnosis not present

## 2020-05-16 DIAGNOSIS — I10 Essential (primary) hypertension: Secondary | ICD-10-CM | POA: Diagnosis not present

## 2020-05-16 DIAGNOSIS — J40 Bronchitis, not specified as acute or chronic: Secondary | ICD-10-CM | POA: Diagnosis not present

## 2020-05-25 DIAGNOSIS — J449 Chronic obstructive pulmonary disease, unspecified: Secondary | ICD-10-CM | POA: Diagnosis not present

## 2020-06-01 DIAGNOSIS — N390 Urinary tract infection, site not specified: Secondary | ICD-10-CM | POA: Diagnosis not present

## 2020-06-08 DIAGNOSIS — D518 Other vitamin B12 deficiency anemias: Secondary | ICD-10-CM | POA: Diagnosis not present

## 2020-06-08 DIAGNOSIS — E559 Vitamin D deficiency, unspecified: Secondary | ICD-10-CM | POA: Diagnosis not present

## 2020-06-08 DIAGNOSIS — E119 Type 2 diabetes mellitus without complications: Secondary | ICD-10-CM | POA: Diagnosis not present

## 2020-06-08 DIAGNOSIS — E11319 Type 2 diabetes mellitus with unspecified diabetic retinopathy without macular edema: Secondary | ICD-10-CM | POA: Diagnosis not present

## 2020-06-08 DIAGNOSIS — E038 Other specified hypothyroidism: Secondary | ICD-10-CM | POA: Diagnosis not present

## 2020-06-16 DIAGNOSIS — B351 Tinea unguium: Secondary | ICD-10-CM | POA: Diagnosis not present

## 2020-06-16 DIAGNOSIS — E11319 Type 2 diabetes mellitus with unspecified diabetic retinopathy without macular edema: Secondary | ICD-10-CM | POA: Diagnosis not present

## 2020-06-19 DIAGNOSIS — I10 Essential (primary) hypertension: Secondary | ICD-10-CM | POA: Diagnosis not present

## 2020-06-19 DIAGNOSIS — E11319 Type 2 diabetes mellitus with unspecified diabetic retinopathy without macular edema: Secondary | ICD-10-CM | POA: Diagnosis not present

## 2020-06-19 DIAGNOSIS — G309 Alzheimer's disease, unspecified: Secondary | ICD-10-CM | POA: Diagnosis not present

## 2020-06-19 DIAGNOSIS — E785 Hyperlipidemia, unspecified: Secondary | ICD-10-CM | POA: Diagnosis not present

## 2020-06-27 DIAGNOSIS — J449 Chronic obstructive pulmonary disease, unspecified: Secondary | ICD-10-CM | POA: Diagnosis not present

## 2020-07-04 DIAGNOSIS — E11319 Type 2 diabetes mellitus with unspecified diabetic retinopathy without macular edema: Secondary | ICD-10-CM | POA: Diagnosis not present

## 2020-07-04 DIAGNOSIS — D518 Other vitamin B12 deficiency anemias: Secondary | ICD-10-CM | POA: Diagnosis not present

## 2020-07-04 DIAGNOSIS — E038 Other specified hypothyroidism: Secondary | ICD-10-CM | POA: Diagnosis not present

## 2020-07-04 DIAGNOSIS — E119 Type 2 diabetes mellitus without complications: Secondary | ICD-10-CM | POA: Diagnosis not present

## 2020-07-04 DIAGNOSIS — E559 Vitamin D deficiency, unspecified: Secondary | ICD-10-CM | POA: Diagnosis not present

## 2020-07-14 DIAGNOSIS — G309 Alzheimer's disease, unspecified: Secondary | ICD-10-CM | POA: Diagnosis not present

## 2020-07-14 DIAGNOSIS — J449 Chronic obstructive pulmonary disease, unspecified: Secondary | ICD-10-CM | POA: Diagnosis not present

## 2020-07-17 DIAGNOSIS — E7849 Other hyperlipidemia: Secondary | ICD-10-CM | POA: Diagnosis not present

## 2020-07-17 DIAGNOSIS — Z79899 Other long term (current) drug therapy: Secondary | ICD-10-CM | POA: Diagnosis not present

## 2020-07-17 DIAGNOSIS — D518 Other vitamin B12 deficiency anemias: Secondary | ICD-10-CM | POA: Diagnosis not present

## 2020-07-17 DIAGNOSIS — E119 Type 2 diabetes mellitus without complications: Secondary | ICD-10-CM | POA: Diagnosis not present

## 2020-07-21 DIAGNOSIS — R0989 Other specified symptoms and signs involving the circulatory and respiratory systems: Secondary | ICD-10-CM | POA: Diagnosis not present

## 2020-07-21 DIAGNOSIS — I6523 Occlusion and stenosis of bilateral carotid arteries: Secondary | ICD-10-CM | POA: Diagnosis not present

## 2020-07-21 DIAGNOSIS — R229 Localized swelling, mass and lump, unspecified: Secondary | ICD-10-CM | POA: Diagnosis not present

## 2020-07-21 DIAGNOSIS — D492 Neoplasm of unspecified behavior of bone, soft tissue, and skin: Secondary | ICD-10-CM | POA: Diagnosis not present

## 2020-07-21 DIAGNOSIS — R590 Localized enlarged lymph nodes: Secondary | ICD-10-CM | POA: Diagnosis not present

## 2020-07-24 DIAGNOSIS — I700 Atherosclerosis of aorta: Secondary | ICD-10-CM | POA: Diagnosis not present

## 2020-07-24 DIAGNOSIS — E042 Nontoxic multinodular goiter: Secondary | ICD-10-CM | POA: Diagnosis not present

## 2020-07-24 DIAGNOSIS — I714 Abdominal aortic aneurysm, without rupture: Secondary | ICD-10-CM | POA: Diagnosis not present

## 2020-07-24 DIAGNOSIS — R221 Localized swelling, mass and lump, neck: Secondary | ICD-10-CM | POA: Diagnosis not present

## 2020-07-25 DIAGNOSIS — R011 Cardiac murmur, unspecified: Secondary | ICD-10-CM | POA: Diagnosis not present

## 2020-07-25 DIAGNOSIS — J449 Chronic obstructive pulmonary disease, unspecified: Secondary | ICD-10-CM | POA: Diagnosis not present

## 2020-07-26 DIAGNOSIS — I739 Peripheral vascular disease, unspecified: Secondary | ICD-10-CM | POA: Diagnosis not present

## 2020-07-26 DIAGNOSIS — I70229 Atherosclerosis of native arteries of extremities with rest pain, unspecified extremity: Secondary | ICD-10-CM | POA: Diagnosis not present

## 2020-07-26 DIAGNOSIS — I70219 Atherosclerosis of native arteries of extremities with intermittent claudication, unspecified extremity: Secondary | ICD-10-CM | POA: Diagnosis not present

## 2020-08-08 DIAGNOSIS — E119 Type 2 diabetes mellitus without complications: Secondary | ICD-10-CM | POA: Diagnosis not present

## 2020-08-08 DIAGNOSIS — Z79899 Other long term (current) drug therapy: Secondary | ICD-10-CM | POA: Diagnosis not present

## 2020-08-08 DIAGNOSIS — E038 Other specified hypothyroidism: Secondary | ICD-10-CM | POA: Diagnosis not present

## 2020-08-08 DIAGNOSIS — D518 Other vitamin B12 deficiency anemias: Secondary | ICD-10-CM | POA: Diagnosis not present

## 2020-08-08 DIAGNOSIS — E559 Vitamin D deficiency, unspecified: Secondary | ICD-10-CM | POA: Diagnosis not present

## 2020-08-09 DIAGNOSIS — E038 Other specified hypothyroidism: Secondary | ICD-10-CM | POA: Diagnosis not present

## 2020-08-09 DIAGNOSIS — E11319 Type 2 diabetes mellitus with unspecified diabetic retinopathy without macular edema: Secondary | ICD-10-CM | POA: Diagnosis not present

## 2020-08-09 DIAGNOSIS — E119 Type 2 diabetes mellitus without complications: Secondary | ICD-10-CM | POA: Diagnosis not present

## 2020-08-09 DIAGNOSIS — E559 Vitamin D deficiency, unspecified: Secondary | ICD-10-CM | POA: Diagnosis not present

## 2020-08-09 DIAGNOSIS — D518 Other vitamin B12 deficiency anemias: Secondary | ICD-10-CM | POA: Diagnosis not present

## 2020-08-17 DIAGNOSIS — G309 Alzheimer's disease, unspecified: Secondary | ICD-10-CM | POA: Diagnosis not present

## 2020-08-17 DIAGNOSIS — E11319 Type 2 diabetes mellitus with unspecified diabetic retinopathy without macular edema: Secondary | ICD-10-CM | POA: Diagnosis not present

## 2020-08-17 DIAGNOSIS — R600 Localized edema: Secondary | ICD-10-CM | POA: Diagnosis not present

## 2020-08-23 DIAGNOSIS — J449 Chronic obstructive pulmonary disease, unspecified: Secondary | ICD-10-CM | POA: Diagnosis not present

## 2020-08-30 DIAGNOSIS — U071 COVID-19: Secondary | ICD-10-CM | POA: Diagnosis not present

## 2020-08-31 DIAGNOSIS — U071 COVID-19: Secondary | ICD-10-CM | POA: Diagnosis not present

## 2020-09-07 DIAGNOSIS — D518 Other vitamin B12 deficiency anemias: Secondary | ICD-10-CM | POA: Diagnosis not present

## 2020-09-07 DIAGNOSIS — E559 Vitamin D deficiency, unspecified: Secondary | ICD-10-CM | POA: Diagnosis not present

## 2020-09-07 DIAGNOSIS — E038 Other specified hypothyroidism: Secondary | ICD-10-CM | POA: Diagnosis not present

## 2020-09-07 DIAGNOSIS — E119 Type 2 diabetes mellitus without complications: Secondary | ICD-10-CM | POA: Diagnosis not present

## 2020-09-07 DIAGNOSIS — E11319 Type 2 diabetes mellitus with unspecified diabetic retinopathy without macular edema: Secondary | ICD-10-CM | POA: Diagnosis not present

## 2020-09-08 DIAGNOSIS — E11319 Type 2 diabetes mellitus with unspecified diabetic retinopathy without macular edema: Secondary | ICD-10-CM | POA: Diagnosis not present

## 2020-09-08 DIAGNOSIS — B351 Tinea unguium: Secondary | ICD-10-CM | POA: Diagnosis not present

## 2020-09-12 ENCOUNTER — Other Ambulatory Visit: Payer: Self-pay

## 2020-09-12 ENCOUNTER — Emergency Department
Admission: EM | Admit: 2020-09-12 | Discharge: 2020-09-13 | Disposition: A | Discharge status: Home / Self Care | Payer: Medicare Other | Attending: Emergency Medicine | Admitting: Emergency Medicine

## 2020-09-12 ENCOUNTER — Emergency Department: Payer: Medicare Other

## 2020-09-12 DIAGNOSIS — Z7984 Long term (current) use of oral hypoglycemic drugs: Secondary | ICD-10-CM | POA: Insufficient documentation

## 2020-09-12 DIAGNOSIS — R4182 Altered mental status, unspecified: Secondary | ICD-10-CM | POA: Diagnosis present

## 2020-09-12 DIAGNOSIS — H2513 Age-related nuclear cataract, bilateral: Secondary | ICD-10-CM | POA: Diagnosis not present

## 2020-09-12 DIAGNOSIS — I10 Essential (primary) hypertension: Secondary | ICD-10-CM | POA: Diagnosis not present

## 2020-09-12 DIAGNOSIS — Z743 Need for continuous supervision: Secondary | ICD-10-CM | POA: Diagnosis not present

## 2020-09-12 DIAGNOSIS — F0391 Unspecified dementia with behavioral disturbance: Secondary | ICD-10-CM | POA: Insufficient documentation

## 2020-09-12 DIAGNOSIS — R5381 Other malaise: Secondary | ICD-10-CM | POA: Diagnosis not present

## 2020-09-12 DIAGNOSIS — E1165 Type 2 diabetes mellitus with hyperglycemia: Secondary | ICD-10-CM | POA: Diagnosis not present

## 2020-09-12 DIAGNOSIS — Z7902 Long term (current) use of antithrombotics/antiplatelets: Secondary | ICD-10-CM | POA: Diagnosis not present

## 2020-09-12 DIAGNOSIS — F03918 Unspecified dementia, unspecified severity, with other behavioral disturbance: Secondary | ICD-10-CM

## 2020-09-12 DIAGNOSIS — Z79899 Other long term (current) drug therapy: Secondary | ICD-10-CM | POA: Diagnosis not present

## 2020-09-12 DIAGNOSIS — Z7982 Long term (current) use of aspirin: Secondary | ICD-10-CM | POA: Diagnosis not present

## 2020-09-12 DIAGNOSIS — E119 Type 2 diabetes mellitus without complications: Secondary | ICD-10-CM | POA: Diagnosis not present

## 2020-09-12 DIAGNOSIS — Z8673 Personal history of transient ischemic attack (TIA), and cerebral infarction without residual deficits: Secondary | ICD-10-CM | POA: Insufficient documentation

## 2020-09-12 DIAGNOSIS — F17200 Nicotine dependence, unspecified, uncomplicated: Secondary | ICD-10-CM | POA: Insufficient documentation

## 2020-09-12 DIAGNOSIS — R059 Cough, unspecified: Secondary | ICD-10-CM | POA: Diagnosis not present

## 2020-09-12 DIAGNOSIS — J449 Chronic obstructive pulmonary disease, unspecified: Secondary | ICD-10-CM | POA: Insufficient documentation

## 2020-09-12 LAB — URINALYSIS, COMPLETE (UACMP) WITH MICROSCOPIC
Bacteria, UA: NONE SEEN
Bilirubin Urine: NEGATIVE
Glucose, UA: NEGATIVE mg/dL
Hgb urine dipstick: NEGATIVE
Ketones, ur: NEGATIVE mg/dL
Leukocytes,Ua: NEGATIVE
Nitrite: NEGATIVE
Protein, ur: NEGATIVE mg/dL
Specific Gravity, Urine: 1.019 (ref 1.005–1.030)
pH: 5 (ref 5.0–8.0)

## 2020-09-12 LAB — COMPREHENSIVE METABOLIC PANEL
ALT: 17 U/L (ref 0–44)
AST: 19 U/L (ref 15–41)
Albumin: 3.4 g/dL — ABNORMAL LOW (ref 3.5–5.0)
Alkaline Phosphatase: 91 U/L (ref 38–126)
Anion gap: 10 (ref 5–15)
BUN: 20 mg/dL (ref 8–23)
CO2: 31 mmol/L (ref 22–32)
Calcium: 9 mg/dL (ref 8.9–10.3)
Chloride: 99 mmol/L (ref 98–111)
Creatinine, Ser: 1.19 mg/dL — ABNORMAL HIGH (ref 0.44–1.00)
GFR, Estimated: 45 mL/min — ABNORMAL LOW (ref >60)
Glucose, Bld: 212 mg/dL — ABNORMAL HIGH (ref 70–99)
Potassium: 3.6 mmol/L (ref 3.5–5.1)
Sodium: 140 mmol/L (ref 135–145)
Total Bilirubin: 0.5 mg/dL (ref 0.3–1.2)
Total Protein: 7.4 g/dL (ref 6.5–8.1)

## 2020-09-12 LAB — BASIC METABOLIC PANEL
Anion gap: 9 (ref 5–15)
BUN: 18 mg/dL (ref 8–23)
CO2: 32 mmol/L (ref 22–32)
Calcium: 9.2 mg/dL (ref 8.9–10.3)
Chloride: 104 mmol/L (ref 98–111)
Creatinine, Ser: 1.12 mg/dL — ABNORMAL HIGH (ref 0.44–1.00)
GFR, Estimated: 49 mL/min — ABNORMAL LOW (ref >60)
Glucose, Bld: 147 mg/dL — ABNORMAL HIGH (ref 70–99)
Potassium: 4 mmol/L (ref 3.5–5.1)
Sodium: 145 mmol/L (ref 135–145)

## 2020-09-12 LAB — CBC
HCT: 41.3 % (ref 36.0–46.0)
Hemoglobin: 12.8 g/dL (ref 12.0–15.0)
MCH: 28.7 pg (ref 26.0–34.0)
MCHC: 31 g/dL (ref 30.0–36.0)
MCV: 92.6 fL (ref 80.0–100.0)
Platelets: 216 10*3/uL (ref 150–400)
RBC: 4.46 MIL/uL (ref 3.87–5.11)
RDW: 14.3 % (ref 11.5–15.5)
WBC: 5.5 10*3/uL (ref 4.0–10.5)
nRBC: 0 % (ref 0.0–0.2)

## 2020-09-12 LAB — TROPONIN I (HIGH SENSITIVITY): Troponin I (High Sensitivity): 10 ng/L (ref <18)

## 2020-09-12 MED ORDER — SODIUM CHLORIDE 0.9 % IV SOLN: Freq: Once | INTRAVENOUS | Status: AC

## 2020-09-12 NOTE — ED Notes (Signed)
No answer at [facility] , also attempted to call husband of patient with no answer, also mailbox was full

## 2020-09-12 NOTE — ED Triage Notes (Signed)
Pt comes into the Ed via EMS from [facility] with c/o that the pt swung at a staff member with a hx of dementia and they want to r/o UTI.Jasmine Arias pt is alert and calm at present during triage.

## 2020-09-12 NOTE — ED Provider Notes (Signed)
[facility] Emergency Department Provider Note   ____________________________________________   Event Date/Time   First MD Initiated Contact with Patient 09/12/20 2051     (approximate)  I have reviewed the triage vital signs and the nursing notes.   HISTORY  Chief Complaint Altered Mental Status    HPI Jasmine Arias is a 85 y.o. female sent from [facility] assisted living because she is reported to have altered mental status.  She apparently became angry and swung at a staff member.  She does have a history of dementia.  Here she is awake alert calm and follows commands.  They are worried she had a UTI.  Lab work so far is negative except that she has worsening glomerular filtration rate.  Possibly from dehydration.  We will give her some fluids.  This hopefully will make her urinate and we can check and see if her GFR improves.  Otherwise she may need admission for AKI.         Past Medical History:  Diagnosis Date  . Anemia   . Anxiety   . Cerebrovascular accident (CVA) due to embolism of left anterior cerebral artery ([place])   . COPD (chronic obstructive pulmonary disease) ([place])   . Dementia ([place])   . Diabetes mellitus without complication ([place])   . Hyperlipidemia   . Hypertension   . Indigestion   . Memory changes   . Stroke (cerebrum) ([place])   . Syncope 09/08/2015  . Vertigo 05/11/2015    Patient Active Problem List   Diagnosis Date Noted  . Parotid mass 07/05/2016  . Right leg weakness 05/15/2016  . H/O endarterectomy   . COPD (chronic obstructive pulmonary disease) ([place]) 02/27/2016  . Iron deficiency anemia 09/14/2015  . History of CVA with residual deficit 09/08/2015  . Trigeminy 09/08/2015  . Carotid stenosis, symptomatic, with infarction ([place]) 08/11/2015  . Mixed Alzheimer's and vascular dementia ([place]) 07/10/2015  . B12 deficiency 07/10/2015  . Malnutrition ([place]) 05/11/2015  . Type 2 diabetes mellitus with background  retinopathy ([place]) 05/11/2015  . HLD (hyperlipidemia) 05/11/2015  . Essential hypertension 05/11/2015  . Multiple thyroid nodules 05/11/2015  . Aortic atherosclerosis ([place]) 05/11/2015  . Polycythemia 02/12/2014  . Hyperthyroidism, subclinical 02/12/2014  . Cardiac murmur 02/11/2014  . Carotid artery bruit 02/11/2014  . Pulmonary nodule 06/10/2012  . Compulsive tobacco user syndrome 06/09/2012    Past Surgical History:  Procedure Laterality Date  . ABDOMINAL HYSTERECTOMY     heavy bleeding  . CAROTID ARTERY - SUBCLAVIAN ARTERY BYPASS GRAFT    . ENDARTERECTOMY Left 08/11/2015   Procedure: ENDARTERECTOMY CAROTID;  Surgeon: Katha Cabal, MD;  Location: [facility] ORS;  Service: Vascular;  Laterality: Left;  . PERIPHERAL VASCULAR CATHETERIZATION Left 08/11/2015   Procedure: Carotid Angiography;  Surgeon: Katha Cabal, MD;  Location: [facility];  Service: Cardiovascular;  Laterality: Left;    Prior to Admission medications   Medication Sig Start Date End Date Taking? Authorizing Provider  acetaminophen (TYLENOL) 325 MG tablet Take 1-2 tablets (325-650 mg total) by mouth every 4 (four) hours as needed for mild pain (or temp >/= 101 F). 12/30/16   Johnson, Megan P, DO  albuterol (PROVENTIL) (2.5 MG/3ML) 0.083% nebulizer solution Take 3 mLs (2.5 mg total) by nebulization every 2 (two) hours as needed for wheezing or shortness of breath. 05/19/16   Jani Gravel, MD  aspirin 325 MG EC tablet Take 1 tablet (325 mg total) by mouth daily. 07/05/16   Park Liter  P, DO  atorvastatin (LIPITOR) 40 MG tablet TAKE 1 TABLET DAILY AT 6 PM. 11/08/16   [place] Liter P, DO  clopidogrel (PLAVIX) 75 MG tablet Take 1 tablet (75 mg total) by mouth daily with breakfast. 07/05/16   Wynetta Emery, Megan P, DO  donepezil (ARICEPT) 10 MG tablet Take 1 tablet (10 mg total) by mouth at bedtime. 07/05/16   Johnson, Megan P, DO  lisinopril (PRINIVIL,ZESTRIL) 40 MG tablet Take 1 tablet (40 mg total) by mouth daily.  11/22/16   Volney American, PA-C  memantine (NAMENDA) 5 MG tablet Take 1 tablet (5 mg total) by mouth 2 (two) times daily. 07/05/16   Johnson, Megan P, DO  metoprolol tartrate (LOPRESSOR) 25 MG tablet Take 0.5 tablets (12.5 mg total) by mouth 2 (two) times daily. 07/05/16   Johnson, Megan P, DO  Nutritional Supplements (ENSURE NUTRITION SHAKE) LIQD Take 8 oz by mouth 2 (two) times daily. 05/11/15   Johnson, Megan P, DO  umeclidinium-vilanterol (ANORO ELLIPTA) 62.5-25 MCG/INH AEPB Inhale 1 puff into the lungs daily. 11/08/16   Valerie Roys, DO    Allergies Patient has no known allergies.  Family History  Problem Relation Age of Onset  . Hypertension Mother   . Hypertension Father   . Heart disease Sister   . Hypertension Brother     Social History Social History   Tobacco Use  . Smoking status: Current Every Day Smoker    Packs/day: 0.75  . Smokeless tobacco: Never Used  Substance Use Topics  . Alcohol use: No    Alcohol/week: 0.0 standard drinks  . Drug use: No    Review of Systems  Constitutional: No fever/chills Eyes: No visual changes. ENT: No sore throat. Cardiovascular: Denies chest pain. Respiratory: Denies shortness of breath. Gastrointestinal: No abdominal pain.  No nausea, no vomiting.  No diarrhea.  No constipation. Genitourinary: Negative for dysuria. Musculoskeletal: Negative for back pain. Skin: Negative for rash. Neurological: Negative for headaches, focal weakness   ____________________________________________   PHYSICAL EXAM:  VITAL SIGNS: ED Triage Vitals  Enc Vitals Group     BP 09/12/20 1520 (!) 155/72     Pulse Rate 09/12/20 1520 77     Resp 09/12/20 1520 17     Temp 09/12/20 1520 97.6 F (36.4 C)     Temp Source 09/12/20 1520 Oral     SpO2 09/12/20 1520 97 %     Weight 09/12/20 1521 135 lb (61.2 kg)     Height 09/12/20 1521 5' (1.524 m)     Head Circumference --      Peak Flow --      Pain Score 09/12/20 1521 0     Pain Loc --       Pain Edu? --      Excl. in [place]? --     Constitutional: Alert and oriented to person and hospital. Well appearing and in no acute distress. Eyes: Conjunctivae are normal. PER. EOMI. Head: Atraumatic. Nose: No congestion/rhinnorhea. Mouth/Throat: Mucous membranes are moist.  Oropharynx non-erythematous. Neck: No stridor. Cardiovascular: Normal rate, regular rhythm. Grossly normal heart sounds.  Good peripheral circulation. Respiratory: Normal respiratory effort.  No retractions. Lungs CTAB. Gastrointestinal: Soft and nontender. No distention. No abdominal bruits. No CVA tenderness. Musculoskeletal: No lower extremity tenderness bilateral mild edema.  Neurologic:  Normal speech and language. No gross focal neurologic deficits are appreciated.  Skin:  Skin is warm, dry and intact. No rash noted.  ____________________________________________   LABS (all labs ordered are listed,  but only abnormal results are displayed)  Labs Reviewed  COMPREHENSIVE METABOLIC PANEL - Abnormal; Notable for the following components:      Result Value   Glucose, Bld 212 (*)    Creatinine, Ser 1.19 (*)    Albumin 3.4 (*)    GFR, Estimated 45 (*)    All other components within normal limits  URINALYSIS, COMPLETE (UACMP) WITH MICROSCOPIC - Abnormal; Notable for the following components:   Color, Urine YELLOW (*)    APPearance CLEAR (*)    All other components within normal limits  BASIC METABOLIC PANEL - Abnormal; Notable for the following components:   Glucose, Bld 147 (*)    Creatinine, Ser 1.12 (*)    GFR, Estimated 49 (*)    All other components within normal limits  CBC  TROPONIN I (HIGH SENSITIVITY)   ____________________________________________  EKG   ____________________________________________  RADIOLOGY Gertha Calkin, personally viewed and evaluated these images (plain radiographs) as part of my medical decision making, as well as reviewing the written report by the  radiologist.  ED MD interpretation:    Official radiology report(s): DG Chest 2 View  Result Date: 09/12/2020 CLINICAL DATA:  Altered mental status, cough EXAM: CHEST - 2 VIEW COMPARISON:  05/14/2016 FINDINGS: Tortuous aorta with calcifications. Heart is normal size. Biapical scarring. No confluent airspace opacities otherwise. No effusions. No acute bony abnormality. IMPRESSION: No active cardiopulmonary disease. Electronically Signed   By: Rolm Baptise M.D.   On: 09/12/2020 21:31    ____________________________________________   PROCEDURES  Procedure(s) performed (including Critical Care):  Procedures   ____________________________________________   INITIAL IMPRESSION / ASSESSMENT AND PLAN / ED COURSE  In the emergency room patient has been calm.  She is polite and respectful and follows commands without difficulty.  She does not have a UTI.  She is not hypoxic she does not have a fever.  Her GFR was down somewhat but improved with some fluid.  Additionally her sugar which was somewhat elevated also improved with fluid.  I am sending her back.  She may have been slightly dehydrated.             ____________________________________________   FINAL CLINICAL IMPRESSION(S) / ED DIAGNOSES  Final diagnoses:  Aggressive behavior due to dementia [facility])     ED Discharge Orders    None      *Please note:  Jasmine Arias was evaluated in Emergency Department on 09/13/2020 for the symptoms described in the history of present illness. She was evaluated in the context of the global COVID-19 pandemic, which necessitated consideration that the patient might be at risk for infection with the SARS-CoV-2 virus that causes COVID-19. Institutional protocols and algorithms that pertain to the evaluation of patients at risk for COVID-19 are in a state of rapid change based on information released by regulatory bodies including the CDC and federal and state organizations. These policies  and algorithms were followed during the patient's care in the ED.  Some ED evaluations and interventions may be delayed as a result of limited staffing during and the pandemic.*   Note:  This document was prepared using Dragon voice recognition software and may include unintentional dictation errors.    Nena Polio, MD 09/13/20 Laureen Abrahams

## 2020-09-12 NOTE — Discharge Instructions (Addendum)
Patient has been calm here in the emergency room.  She is very cooperative.  She does not have a UTI.  Her other lab work is stable.  We will let her go back.

## 2020-09-12 NOTE — ED Notes (Signed)
Called report to tammy @ [facility] [address] all questions answered , pt unable to sign DC paperwork due to dementia

## 2020-09-13 DIAGNOSIS — Z7401 Bed confinement status: Secondary | ICD-10-CM | POA: Diagnosis not present

## 2020-09-13 DIAGNOSIS — M2550 Pain in unspecified joint: Secondary | ICD-10-CM | POA: Diagnosis not present

## 2020-09-13 DIAGNOSIS — R404 Transient alteration of awareness: Secondary | ICD-10-CM | POA: Diagnosis not present

## 2020-09-13 DIAGNOSIS — Z743 Need for continuous supervision: Secondary | ICD-10-CM | POA: Diagnosis not present

## 2020-09-21 DIAGNOSIS — E785 Hyperlipidemia, unspecified: Secondary | ICD-10-CM | POA: Diagnosis not present

## 2020-09-21 DIAGNOSIS — J449 Chronic obstructive pulmonary disease, unspecified: Secondary | ICD-10-CM | POA: Diagnosis not present

## 2020-09-21 DIAGNOSIS — R600 Localized edema: Secondary | ICD-10-CM | POA: Diagnosis not present

## 2020-09-21 DIAGNOSIS — I10 Essential (primary) hypertension: Secondary | ICD-10-CM | POA: Diagnosis not present

## 2020-09-21 DIAGNOSIS — K5900 Constipation, unspecified: Secondary | ICD-10-CM | POA: Diagnosis not present

## 2020-09-28 DIAGNOSIS — U071 COVID-19: Secondary | ICD-10-CM | POA: Diagnosis not present

## 2020-10-04 DIAGNOSIS — E785 Hyperlipidemia, unspecified: Secondary | ICD-10-CM | POA: Diagnosis not present

## 2020-10-04 DIAGNOSIS — G309 Alzheimer's disease, unspecified: Secondary | ICD-10-CM | POA: Diagnosis not present

## 2020-10-04 DIAGNOSIS — E119 Type 2 diabetes mellitus without complications: Secondary | ICD-10-CM | POA: Diagnosis not present

## 2020-10-04 DIAGNOSIS — J449 Chronic obstructive pulmonary disease, unspecified: Secondary | ICD-10-CM | POA: Diagnosis not present

## 2020-10-04 DIAGNOSIS — I10 Essential (primary) hypertension: Secondary | ICD-10-CM | POA: Diagnosis not present

## 2020-10-04 DIAGNOSIS — D518 Other vitamin B12 deficiency anemias: Secondary | ICD-10-CM | POA: Diagnosis not present

## 2020-10-04 DIAGNOSIS — E11319 Type 2 diabetes mellitus with unspecified diabetic retinopathy without macular edema: Secondary | ICD-10-CM | POA: Diagnosis not present

## 2020-10-04 DIAGNOSIS — E038 Other specified hypothyroidism: Secondary | ICD-10-CM | POA: Diagnosis not present

## 2020-10-04 DIAGNOSIS — E559 Vitamin D deficiency, unspecified: Secondary | ICD-10-CM | POA: Diagnosis not present

## 2020-10-24 DIAGNOSIS — J449 Chronic obstructive pulmonary disease, unspecified: Secondary | ICD-10-CM | POA: Diagnosis not present

## 2020-10-26 DIAGNOSIS — J449 Chronic obstructive pulmonary disease, unspecified: Secondary | ICD-10-CM | POA: Diagnosis not present

## 2020-10-26 DIAGNOSIS — I10 Essential (primary) hypertension: Secondary | ICD-10-CM | POA: Diagnosis not present

## 2020-10-26 DIAGNOSIS — K5900 Constipation, unspecified: Secondary | ICD-10-CM | POA: Diagnosis not present

## 2020-10-26 DIAGNOSIS — R600 Localized edema: Secondary | ICD-10-CM | POA: Diagnosis not present

## 2020-10-26 DIAGNOSIS — E119 Type 2 diabetes mellitus without complications: Secondary | ICD-10-CM | POA: Diagnosis not present

## 2020-10-26 DIAGNOSIS — E785 Hyperlipidemia, unspecified: Secondary | ICD-10-CM | POA: Diagnosis not present

## 2020-11-05 DIAGNOSIS — E038 Other specified hypothyroidism: Secondary | ICD-10-CM | POA: Diagnosis not present

## 2020-11-05 DIAGNOSIS — E11319 Type 2 diabetes mellitus with unspecified diabetic retinopathy without macular edema: Secondary | ICD-10-CM | POA: Diagnosis not present

## 2020-11-05 DIAGNOSIS — E119 Type 2 diabetes mellitus without complications: Secondary | ICD-10-CM | POA: Diagnosis not present

## 2020-11-05 DIAGNOSIS — J449 Chronic obstructive pulmonary disease, unspecified: Secondary | ICD-10-CM | POA: Diagnosis not present

## 2020-11-05 DIAGNOSIS — G309 Alzheimer's disease, unspecified: Secondary | ICD-10-CM | POA: Diagnosis not present

## 2020-11-05 DIAGNOSIS — E785 Hyperlipidemia, unspecified: Secondary | ICD-10-CM | POA: Diagnosis not present

## 2020-11-05 DIAGNOSIS — D518 Other vitamin B12 deficiency anemias: Secondary | ICD-10-CM | POA: Diagnosis not present

## 2020-11-05 DIAGNOSIS — E559 Vitamin D deficiency, unspecified: Secondary | ICD-10-CM | POA: Diagnosis not present

## 2020-11-05 DIAGNOSIS — I10 Essential (primary) hypertension: Secondary | ICD-10-CM | POA: Diagnosis not present

## 2020-11-07 DIAGNOSIS — E119 Type 2 diabetes mellitus without complications: Secondary | ICD-10-CM | POA: Diagnosis not present

## 2020-11-07 DIAGNOSIS — E559 Vitamin D deficiency, unspecified: Secondary | ICD-10-CM | POA: Diagnosis not present

## 2020-11-07 DIAGNOSIS — E7849 Other hyperlipidemia: Secondary | ICD-10-CM | POA: Diagnosis not present

## 2020-11-07 DIAGNOSIS — E038 Other specified hypothyroidism: Secondary | ICD-10-CM | POA: Diagnosis not present

## 2020-11-07 DIAGNOSIS — D518 Other vitamin B12 deficiency anemias: Secondary | ICD-10-CM | POA: Diagnosis not present

## 2020-11-07 DIAGNOSIS — Z79899 Other long term (current) drug therapy: Secondary | ICD-10-CM | POA: Diagnosis not present

## 2020-11-09 DIAGNOSIS — N390 Urinary tract infection, site not specified: Secondary | ICD-10-CM | POA: Diagnosis not present

## 2020-11-13 DIAGNOSIS — N390 Urinary tract infection, site not specified: Secondary | ICD-10-CM | POA: Diagnosis not present

## 2020-11-22 DIAGNOSIS — J449 Chronic obstructive pulmonary disease, unspecified: Secondary | ICD-10-CM | POA: Diagnosis not present

## 2020-11-30 DIAGNOSIS — E11319 Type 2 diabetes mellitus with unspecified diabetic retinopathy without macular edema: Secondary | ICD-10-CM | POA: Diagnosis not present

## 2020-11-30 DIAGNOSIS — D518 Other vitamin B12 deficiency anemias: Secondary | ICD-10-CM | POA: Diagnosis not present

## 2020-11-30 DIAGNOSIS — G309 Alzheimer's disease, unspecified: Secondary | ICD-10-CM | POA: Diagnosis not present

## 2020-11-30 DIAGNOSIS — E119 Type 2 diabetes mellitus without complications: Secondary | ICD-10-CM | POA: Diagnosis not present

## 2020-11-30 DIAGNOSIS — E038 Other specified hypothyroidism: Secondary | ICD-10-CM | POA: Diagnosis not present

## 2020-11-30 DIAGNOSIS — E785 Hyperlipidemia, unspecified: Secondary | ICD-10-CM | POA: Diagnosis not present

## 2020-11-30 DIAGNOSIS — E559 Vitamin D deficiency, unspecified: Secondary | ICD-10-CM | POA: Diagnosis not present

## 2020-11-30 DIAGNOSIS — J449 Chronic obstructive pulmonary disease, unspecified: Secondary | ICD-10-CM | POA: Diagnosis not present

## 2020-11-30 DIAGNOSIS — I10 Essential (primary) hypertension: Secondary | ICD-10-CM | POA: Diagnosis not present

## 2020-12-12 DIAGNOSIS — E785 Hyperlipidemia, unspecified: Secondary | ICD-10-CM | POA: Diagnosis not present

## 2020-12-12 DIAGNOSIS — K5900 Constipation, unspecified: Secondary | ICD-10-CM | POA: Diagnosis not present

## 2020-12-12 DIAGNOSIS — J449 Chronic obstructive pulmonary disease, unspecified: Secondary | ICD-10-CM | POA: Diagnosis not present

## 2020-12-12 DIAGNOSIS — R600 Localized edema: Secondary | ICD-10-CM | POA: Diagnosis not present

## 2020-12-12 DIAGNOSIS — E11319 Type 2 diabetes mellitus with unspecified diabetic retinopathy without macular edema: Secondary | ICD-10-CM | POA: Diagnosis not present

## 2020-12-12 DIAGNOSIS — G309 Alzheimer's disease, unspecified: Secondary | ICD-10-CM | POA: Diagnosis not present

## 2020-12-12 DIAGNOSIS — E559 Vitamin D deficiency, unspecified: Secondary | ICD-10-CM | POA: Diagnosis not present

## 2020-12-12 DIAGNOSIS — I10 Essential (primary) hypertension: Secondary | ICD-10-CM | POA: Diagnosis not present

## 2020-12-14 DIAGNOSIS — E7849 Other hyperlipidemia: Secondary | ICD-10-CM | POA: Diagnosis not present

## 2020-12-14 DIAGNOSIS — Z79899 Other long term (current) drug therapy: Secondary | ICD-10-CM | POA: Diagnosis not present

## 2020-12-14 DIAGNOSIS — E11319 Type 2 diabetes mellitus with unspecified diabetic retinopathy without macular edema: Secondary | ICD-10-CM | POA: Diagnosis not present

## 2020-12-14 DIAGNOSIS — B351 Tinea unguium: Secondary | ICD-10-CM | POA: Diagnosis not present

## 2020-12-14 DIAGNOSIS — E119 Type 2 diabetes mellitus without complications: Secondary | ICD-10-CM | POA: Diagnosis not present

## 2020-12-14 DIAGNOSIS — D518 Other vitamin B12 deficiency anemias: Secondary | ICD-10-CM | POA: Diagnosis not present

## 2020-12-22 DIAGNOSIS — J449 Chronic obstructive pulmonary disease, unspecified: Secondary | ICD-10-CM | POA: Diagnosis not present

## 2021-01-04 DIAGNOSIS — E785 Hyperlipidemia, unspecified: Secondary | ICD-10-CM | POA: Diagnosis not present

## 2021-01-04 DIAGNOSIS — E11319 Type 2 diabetes mellitus with unspecified diabetic retinopathy without macular edema: Secondary | ICD-10-CM | POA: Diagnosis not present

## 2021-01-04 DIAGNOSIS — E038 Other specified hypothyroidism: Secondary | ICD-10-CM | POA: Diagnosis not present

## 2021-01-04 DIAGNOSIS — E559 Vitamin D deficiency, unspecified: Secondary | ICD-10-CM | POA: Diagnosis not present

## 2021-01-04 DIAGNOSIS — I10 Essential (primary) hypertension: Secondary | ICD-10-CM | POA: Diagnosis not present

## 2021-01-04 DIAGNOSIS — J449 Chronic obstructive pulmonary disease, unspecified: Secondary | ICD-10-CM | POA: Diagnosis not present

## 2021-01-04 DIAGNOSIS — E119 Type 2 diabetes mellitus without complications: Secondary | ICD-10-CM | POA: Diagnosis not present

## 2021-01-04 DIAGNOSIS — G309 Alzheimer's disease, unspecified: Secondary | ICD-10-CM | POA: Diagnosis not present

## 2021-01-04 DIAGNOSIS — D518 Other vitamin B12 deficiency anemias: Secondary | ICD-10-CM | POA: Diagnosis not present

## 2021-01-09 DIAGNOSIS — E559 Vitamin D deficiency, unspecified: Secondary | ICD-10-CM | POA: Diagnosis not present

## 2021-01-09 DIAGNOSIS — I10 Essential (primary) hypertension: Secondary | ICD-10-CM | POA: Diagnosis not present

## 2021-01-09 DIAGNOSIS — J449 Chronic obstructive pulmonary disease, unspecified: Secondary | ICD-10-CM | POA: Diagnosis not present

## 2021-01-09 DIAGNOSIS — E11319 Type 2 diabetes mellitus with unspecified diabetic retinopathy without macular edema: Secondary | ICD-10-CM | POA: Diagnosis not present

## 2021-01-09 DIAGNOSIS — E785 Hyperlipidemia, unspecified: Secondary | ICD-10-CM | POA: Diagnosis not present

## 2021-01-09 DIAGNOSIS — R600 Localized edema: Secondary | ICD-10-CM | POA: Diagnosis not present

## 2021-01-09 DIAGNOSIS — K5900 Constipation, unspecified: Secondary | ICD-10-CM | POA: Diagnosis not present

## 2021-01-10 DIAGNOSIS — R601 Generalized edema: Secondary | ICD-10-CM | POA: Diagnosis not present

## 2021-01-22 DIAGNOSIS — J449 Chronic obstructive pulmonary disease, unspecified: Secondary | ICD-10-CM | POA: Diagnosis not present

## 2021-02-05 DIAGNOSIS — E119 Type 2 diabetes mellitus without complications: Secondary | ICD-10-CM | POA: Diagnosis not present

## 2021-02-05 DIAGNOSIS — D518 Other vitamin B12 deficiency anemias: Secondary | ICD-10-CM | POA: Diagnosis not present

## 2021-02-05 DIAGNOSIS — E11319 Type 2 diabetes mellitus with unspecified diabetic retinopathy without macular edema: Secondary | ICD-10-CM | POA: Diagnosis not present

## 2021-02-05 DIAGNOSIS — G309 Alzheimer's disease, unspecified: Secondary | ICD-10-CM | POA: Diagnosis not present

## 2021-02-05 DIAGNOSIS — E038 Other specified hypothyroidism: Secondary | ICD-10-CM | POA: Diagnosis not present

## 2021-02-05 DIAGNOSIS — I10 Essential (primary) hypertension: Secondary | ICD-10-CM | POA: Diagnosis not present

## 2021-02-05 DIAGNOSIS — E559 Vitamin D deficiency, unspecified: Secondary | ICD-10-CM | POA: Diagnosis not present

## 2021-02-05 DIAGNOSIS — E785 Hyperlipidemia, unspecified: Secondary | ICD-10-CM | POA: Diagnosis not present

## 2021-02-05 DIAGNOSIS — J449 Chronic obstructive pulmonary disease, unspecified: Secondary | ICD-10-CM | POA: Diagnosis not present

## 2021-02-14 DIAGNOSIS — I10 Essential (primary) hypertension: Secondary | ICD-10-CM | POA: Diagnosis not present

## 2021-02-14 DIAGNOSIS — G309 Alzheimer's disease, unspecified: Secondary | ICD-10-CM | POA: Diagnosis not present

## 2021-02-14 DIAGNOSIS — K5900 Constipation, unspecified: Secondary | ICD-10-CM | POA: Diagnosis not present

## 2021-02-14 DIAGNOSIS — E785 Hyperlipidemia, unspecified: Secondary | ICD-10-CM | POA: Diagnosis not present

## 2021-02-14 DIAGNOSIS — E559 Vitamin D deficiency, unspecified: Secondary | ICD-10-CM | POA: Diagnosis not present

## 2021-02-14 DIAGNOSIS — J449 Chronic obstructive pulmonary disease, unspecified: Secondary | ICD-10-CM | POA: Diagnosis not present

## 2021-02-14 DIAGNOSIS — E11319 Type 2 diabetes mellitus with unspecified diabetic retinopathy without macular edema: Secondary | ICD-10-CM | POA: Diagnosis not present

## 2021-02-20 DIAGNOSIS — J449 Chronic obstructive pulmonary disease, unspecified: Secondary | ICD-10-CM | POA: Diagnosis not present

## 2021-02-22 DIAGNOSIS — E7849 Other hyperlipidemia: Secondary | ICD-10-CM | POA: Diagnosis not present

## 2021-02-22 DIAGNOSIS — Z79899 Other long term (current) drug therapy: Secondary | ICD-10-CM | POA: Diagnosis not present

## 2021-02-22 DIAGNOSIS — E119 Type 2 diabetes mellitus without complications: Secondary | ICD-10-CM | POA: Diagnosis not present

## 2021-02-22 DIAGNOSIS — D518 Other vitamin B12 deficiency anemias: Secondary | ICD-10-CM | POA: Diagnosis not present

## 2021-03-01 DIAGNOSIS — E119 Type 2 diabetes mellitus without complications: Secondary | ICD-10-CM | POA: Diagnosis not present

## 2021-03-01 DIAGNOSIS — Z79899 Other long term (current) drug therapy: Secondary | ICD-10-CM | POA: Diagnosis not present

## 2021-03-01 DIAGNOSIS — E7849 Other hyperlipidemia: Secondary | ICD-10-CM | POA: Diagnosis not present

## 2021-03-01 DIAGNOSIS — D518 Other vitamin B12 deficiency anemias: Secondary | ICD-10-CM | POA: Diagnosis not present

## 2021-03-13 DIAGNOSIS — D518 Other vitamin B12 deficiency anemias: Secondary | ICD-10-CM | POA: Diagnosis not present

## 2021-03-13 DIAGNOSIS — K5900 Constipation, unspecified: Secondary | ICD-10-CM | POA: Diagnosis not present

## 2021-03-13 DIAGNOSIS — R600 Localized edema: Secondary | ICD-10-CM | POA: Diagnosis not present

## 2021-03-13 DIAGNOSIS — H2513 Age-related nuclear cataract, bilateral: Secondary | ICD-10-CM | POA: Diagnosis not present

## 2021-03-13 DIAGNOSIS — E11319 Type 2 diabetes mellitus with unspecified diabetic retinopathy without macular edema: Secondary | ICD-10-CM | POA: Diagnosis not present

## 2021-03-13 DIAGNOSIS — E038 Other specified hypothyroidism: Secondary | ICD-10-CM | POA: Diagnosis not present

## 2021-03-13 DIAGNOSIS — E119 Type 2 diabetes mellitus without complications: Secondary | ICD-10-CM | POA: Diagnosis not present

## 2021-03-13 DIAGNOSIS — E559 Vitamin D deficiency, unspecified: Secondary | ICD-10-CM | POA: Diagnosis not present

## 2021-03-13 DIAGNOSIS — H3530 Unspecified macular degeneration: Secondary | ICD-10-CM | POA: Diagnosis not present

## 2021-03-13 DIAGNOSIS — H4321 Crystalline deposits in vitreous body, right eye: Secondary | ICD-10-CM | POA: Diagnosis not present

## 2021-03-13 DIAGNOSIS — G309 Alzheimer's disease, unspecified: Secondary | ICD-10-CM | POA: Diagnosis not present

## 2021-03-13 DIAGNOSIS — E785 Hyperlipidemia, unspecified: Secondary | ICD-10-CM | POA: Diagnosis not present

## 2021-03-13 DIAGNOSIS — J449 Chronic obstructive pulmonary disease, unspecified: Secondary | ICD-10-CM | POA: Diagnosis not present

## 2021-03-13 DIAGNOSIS — I10 Essential (primary) hypertension: Secondary | ICD-10-CM | POA: Diagnosis not present

## 2021-03-15 DIAGNOSIS — E7849 Other hyperlipidemia: Secondary | ICD-10-CM | POA: Diagnosis not present

## 2021-03-15 DIAGNOSIS — Z79899 Other long term (current) drug therapy: Secondary | ICD-10-CM | POA: Diagnosis not present

## 2021-03-15 DIAGNOSIS — D518 Other vitamin B12 deficiency anemias: Secondary | ICD-10-CM | POA: Diagnosis not present

## 2021-03-15 DIAGNOSIS — E119 Type 2 diabetes mellitus without complications: Secondary | ICD-10-CM | POA: Diagnosis not present

## 2021-03-20 DIAGNOSIS — G309 Alzheimer's disease, unspecified: Secondary | ICD-10-CM | POA: Diagnosis not present

## 2021-03-20 DIAGNOSIS — J449 Chronic obstructive pulmonary disease, unspecified: Secondary | ICD-10-CM | POA: Diagnosis not present

## 2021-03-20 DIAGNOSIS — I10 Essential (primary) hypertension: Secondary | ICD-10-CM | POA: Diagnosis not present

## 2021-03-20 DIAGNOSIS — R600 Localized edema: Secondary | ICD-10-CM | POA: Diagnosis not present

## 2021-03-20 DIAGNOSIS — E11319 Type 2 diabetes mellitus with unspecified diabetic retinopathy without macular edema: Secondary | ICD-10-CM | POA: Diagnosis not present

## 2021-03-20 DIAGNOSIS — I69311 Memory deficit following cerebral infarction: Secondary | ICD-10-CM | POA: Diagnosis not present

## 2021-03-20 DIAGNOSIS — Z794 Long term (current) use of insulin: Secondary | ICD-10-CM | POA: Diagnosis not present

## 2021-03-22 DIAGNOSIS — R600 Localized edema: Secondary | ICD-10-CM | POA: Diagnosis not present

## 2021-03-22 DIAGNOSIS — G309 Alzheimer's disease, unspecified: Secondary | ICD-10-CM | POA: Diagnosis not present

## 2021-03-22 DIAGNOSIS — I10 Essential (primary) hypertension: Secondary | ICD-10-CM | POA: Diagnosis not present

## 2021-03-22 DIAGNOSIS — I69311 Memory deficit following cerebral infarction: Secondary | ICD-10-CM | POA: Diagnosis not present

## 2021-03-22 DIAGNOSIS — Z794 Long term (current) use of insulin: Secondary | ICD-10-CM | POA: Diagnosis not present

## 2021-03-22 DIAGNOSIS — E11319 Type 2 diabetes mellitus with unspecified diabetic retinopathy without macular edema: Secondary | ICD-10-CM | POA: Diagnosis not present

## 2021-03-22 DIAGNOSIS — J449 Chronic obstructive pulmonary disease, unspecified: Secondary | ICD-10-CM | POA: Diagnosis not present

## 2021-03-23 DIAGNOSIS — J449 Chronic obstructive pulmonary disease, unspecified: Secondary | ICD-10-CM | POA: Diagnosis not present

## 2021-03-26 DIAGNOSIS — E119 Type 2 diabetes mellitus without complications: Secondary | ICD-10-CM | POA: Diagnosis not present

## 2021-03-27 ENCOUNTER — Other Ambulatory Visit: Payer: Self-pay

## 2021-03-27 ENCOUNTER — Emergency Department
Admission: EM | Admit: 2021-03-27 | Discharge: 2021-03-27 | Disposition: A | Discharge status: Home / Self Care | Payer: Medicare Other | Attending: Emergency Medicine | Admitting: Emergency Medicine

## 2021-03-27 ENCOUNTER — Emergency Department: Payer: Medicare Other

## 2021-03-27 DIAGNOSIS — E785 Hyperlipidemia, unspecified: Secondary | ICD-10-CM | POA: Diagnosis not present

## 2021-03-27 DIAGNOSIS — F0281 Dementia in other diseases classified elsewhere with behavioral disturbance: Secondary | ICD-10-CM | POA: Diagnosis not present

## 2021-03-27 DIAGNOSIS — Z20822 Contact with and (suspected) exposure to covid-19: Secondary | ICD-10-CM | POA: Diagnosis not present

## 2021-03-27 DIAGNOSIS — J441 Chronic obstructive pulmonary disease with (acute) exacerbation: Secondary | ICD-10-CM | POA: Insufficient documentation

## 2021-03-27 DIAGNOSIS — Z7982 Long term (current) use of aspirin: Secondary | ICD-10-CM | POA: Insufficient documentation

## 2021-03-27 DIAGNOSIS — R404 Transient alteration of awareness: Secondary | ICD-10-CM | POA: Diagnosis not present

## 2021-03-27 DIAGNOSIS — R6889 Other general symptoms and signs: Secondary | ICD-10-CM | POA: Diagnosis not present

## 2021-03-27 DIAGNOSIS — E1169 Type 2 diabetes mellitus with other specified complication: Secondary | ICD-10-CM | POA: Diagnosis not present

## 2021-03-27 DIAGNOSIS — G309 Alzheimer's disease, unspecified: Secondary | ICD-10-CM | POA: Insufficient documentation

## 2021-03-27 DIAGNOSIS — Z743 Need for continuous supervision: Secondary | ICD-10-CM | POA: Diagnosis not present

## 2021-03-27 DIAGNOSIS — Z7401 Bed confinement status: Secondary | ICD-10-CM | POA: Diagnosis not present

## 2021-03-27 DIAGNOSIS — R062 Wheezing: Secondary | ICD-10-CM | POA: Diagnosis not present

## 2021-03-27 DIAGNOSIS — E11319 Type 2 diabetes mellitus with unspecified diabetic retinopathy without macular edema: Secondary | ICD-10-CM | POA: Insufficient documentation

## 2021-03-27 DIAGNOSIS — F17210 Nicotine dependence, cigarettes, uncomplicated: Secondary | ICD-10-CM | POA: Insufficient documentation

## 2021-03-27 DIAGNOSIS — R0689 Other abnormalities of breathing: Secondary | ICD-10-CM | POA: Diagnosis not present

## 2021-03-27 DIAGNOSIS — Z7902 Long term (current) use of antithrombotics/antiplatelets: Secondary | ICD-10-CM | POA: Diagnosis not present

## 2021-03-27 DIAGNOSIS — R0602 Shortness of breath: Secondary | ICD-10-CM | POA: Diagnosis not present

## 2021-03-27 DIAGNOSIS — M2550 Pain in unspecified joint: Secondary | ICD-10-CM | POA: Diagnosis not present

## 2021-03-27 LAB — CBC WITH DIFFERENTIAL/PLATELET
Abs Immature Granulocytes: 0.01 10*3/uL (ref 0.00–0.07)
Basophils Absolute: 0 10*3/uL (ref 0.0–0.1)
Basophils Relative: 0 %
Eosinophils Absolute: 0.2 10*3/uL (ref 0.0–0.5)
Eosinophils Relative: 4 %
HCT: 43.2 % (ref 36.0–46.0)
Hemoglobin: 13.6 g/dL (ref 12.0–15.0)
Immature Granulocytes: 0 %
Lymphocytes Relative: 22 %
Lymphs Abs: 1.1 10*3/uL (ref 0.7–4.0)
MCH: 29.6 pg (ref 26.0–34.0)
MCHC: 31.5 g/dL (ref 30.0–36.0)
MCV: 93.9 fL (ref 80.0–100.0)
Monocytes Absolute: 0.3 10*3/uL (ref 0.1–1.0)
Monocytes Relative: 6 %
Neutro Abs: 3.5 10*3/uL (ref 1.7–7.7)
Neutrophils Relative %: 68 %
Platelets: 167 10*3/uL (ref 150–400)
RBC: 4.6 MIL/uL (ref 3.87–5.11)
RDW: 13.9 % (ref 11.5–15.5)
WBC: 5.2 10*3/uL (ref 4.0–10.5)
nRBC: 0 % (ref 0.0–0.2)

## 2021-03-27 LAB — COMPREHENSIVE METABOLIC PANEL
ALT: 15 U/L (ref 0–44)
AST: 22 U/L (ref 15–41)
Albumin: 3.5 g/dL (ref 3.5–5.0)
Alkaline Phosphatase: 85 U/L (ref 38–126)
Anion gap: 9 (ref 5–15)
BUN: 14 mg/dL (ref 8–23)
CO2: 27 mmol/L (ref 22–32)
Calcium: 9.2 mg/dL (ref 8.9–10.3)
Chloride: 106 mmol/L (ref 98–111)
Creatinine, Ser: 1.1 mg/dL — ABNORMAL HIGH (ref 0.44–1.00)
GFR, Estimated: 50 mL/min — ABNORMAL LOW (ref >60)
Glucose, Bld: 146 mg/dL — ABNORMAL HIGH (ref 70–99)
Potassium: 4.2 mmol/L (ref 3.5–5.1)
Sodium: 142 mmol/L (ref 135–145)
Total Bilirubin: 0.6 mg/dL (ref 0.3–1.2)
Total Protein: 7.2 g/dL (ref 6.5–8.1)

## 2021-03-27 LAB — URINALYSIS, COMPLETE (UACMP) WITH MICROSCOPIC
Bilirubin Urine: NEGATIVE
Glucose, UA: NEGATIVE mg/dL
Hgb urine dipstick: NEGATIVE
Ketones, ur: NEGATIVE mg/dL
Leukocytes,Ua: NEGATIVE
Nitrite: NEGATIVE
Protein, ur: NEGATIVE mg/dL
Specific Gravity, Urine: 1.006 (ref 1.005–1.030)
pH: 6 (ref 5.0–8.0)

## 2021-03-27 LAB — BRAIN NATRIURETIC PEPTIDE: B Natriuretic Peptide: 124.8 pg/mL — ABNORMAL HIGH (ref 0.0–100.0)

## 2021-03-27 LAB — RESP PANEL BY RT-PCR (FLU A&B, COVID) ARPGX2
Influenza A by PCR: NEGATIVE
Influenza B by PCR: NEGATIVE
SARS Coronavirus 2 by RT PCR: NEGATIVE

## 2021-03-27 MED ORDER — IPRATROPIUM-ALBUTEROL 0.5-2.5 (3) MG/3ML IN SOLN: 3.0000 mL | Freq: Four times a day (QID) | RESPIRATORY_TRACT | 0 refills | Status: AC | PRN

## 2021-03-27 MED ORDER — IPRATROPIUM-ALBUTEROL 0.5-2.5 (3) MG/3ML IN SOLN
3.0000 mL | Freq: Once | RESPIRATORY_TRACT | Status: AC
  Administered 2021-03-27: 3 mL via RESPIRATORY_TRACT
  Filled 2021-03-27: qty 3

## 2021-03-27 MED ORDER — AZITHROMYCIN 250 MG PO TABS: ORAL_TABLET | ORAL | 0 refills | Status: DC

## 2021-03-27 MED ORDER — METHYLPREDNISOLONE SODIUM SUCC 125 MG IJ SOLR: 125.0000 mg | Freq: Once | INTRAMUSCULAR | Status: DC

## 2021-03-27 MED ORDER — PREDNISONE 10 MG (21) PO TBPK: ORAL_TABLET | ORAL | 0 refills | Status: DC

## 2021-03-27 MED ORDER — DEXAMETHASONE SODIUM PHOSPHATE 10 MG/ML IJ SOLN
10.0000 mg | Freq: Once | INTRAMUSCULAR | Status: AC
  Administered 2021-03-27: 10 mg via INTRAVENOUS
  Filled 2021-03-27: qty 1

## 2021-03-27 NOTE — Discharge Instructions (Addendum)
Give the patient DuoNeb every 6 hours for the next 4 days, this will replace her albuterol treatments over the next 4 days.  After 4 days return to the regular albuterol treatments Zithromax is 2 pills on day 1 and then 1 pill a day for the next 4 days Sterapred Dosepak use as directed Return to emergency department worsening

## 2021-03-27 NOTE — ED Notes (Signed)
[facility]   CALLED  FOR  TRANSPORT TO  [facility]

## 2021-03-27 NOTE — ED Triage Notes (Signed)
Pt arrives via ems from [facility], pt as been wheezing for the past few days, ems reports recurrent pneumonia, not currently on antibiotics pt was given a duoneb enroute and reported pt's sats to be in the mid 80's on room air

## 2021-03-27 NOTE — ED Provider Notes (Signed)
[facility] Emergency Department Provider Note  ____________________________________________   Event Date/Time   First MD Initiated Contact with Patient 03/27/21 1036     (approximate)  I have reviewed the triage vital signs and the nursing notes.   HISTORY  Chief Complaint Wheezing    HPI Jasmine Arias is a 86 y.o. female presents emergency department via EMS from [facility].  Patient has been wheezing for the past few days.  EMS states that the patient has recurrent pneumonia and is not currently on any antibiotics.  She was given a DuoNeb in route and EMS states that her O2 saturation was in the 80s on room air.  Past Medical History:  Diagnosis Date   Anemia    Anxiety    Cerebrovascular accident (CVA) due to embolism of left anterior cerebral artery (HCC)    COPD (chronic obstructive pulmonary disease) ([place])    Dementia ([place])    Diabetes mellitus without complication ([place])    Hyperlipidemia    Hypertension    Indigestion    Memory changes    Stroke (cerebrum) ([place])    Syncope 09/08/2015   Vertigo 05/11/2015    Patient Active Problem List   Diagnosis Date Noted   Parotid mass 07/05/2016   Right leg weakness 05/15/2016   H/O endarterectomy    COPD (chronic obstructive pulmonary disease) ([place]) 02/27/2016   Iron deficiency anemia 09/14/2015   History of CVA with residual deficit 09/08/2015   Trigeminy 09/08/2015   Carotid stenosis, symptomatic, with infarction ([place]) 08/11/2015   Mixed Alzheimer's and vascular dementia ([place]) 07/10/2015   B12 deficiency 07/10/2015   Malnutrition ([place]) 05/11/2015   Type 2 diabetes mellitus with background retinopathy ([place]) 05/11/2015   HLD (hyperlipidemia) 05/11/2015   Essential hypertension 05/11/2015   Multiple thyroid nodules 05/11/2015   Aortic atherosclerosis ([place]) 05/11/2015   Polycythemia 02/12/2014   Hyperthyroidism, subclinical 02/12/2014   Cardiac murmur 02/11/2014   Carotid  artery bruit 02/11/2014   Pulmonary nodule 06/10/2012   Compulsive tobacco user syndrome 06/09/2012    Past Surgical History:  Procedure Laterality Date   ABDOMINAL HYSTERECTOMY     heavy bleeding   CAROTID ARTERY - SUBCLAVIAN ARTERY BYPASS GRAFT     ENDARTERECTOMY Left 08/11/2015   Procedure: ENDARTERECTOMY CAROTID;  Surgeon: Katha Cabal, MD;  Location: [facility] ORS;  Service: Vascular;  Laterality: Left;   PERIPHERAL VASCULAR CATHETERIZATION Left 08/11/2015   Procedure: Carotid Angiography;  Surgeon: Katha Cabal, MD;  Location: [place] [facility];  Service: Cardiovascular;  Laterality: Left;    Prior to Admission medications   Medication Sig Start Date End Date Taking? Authorizing Provider  azithromycin (ZITHROMAX Z-PAK) 250 MG tablet 2 pills today then 1 pill a day for 4 days 03/27/21  Yes Nicki Furlan, Linden Dolin, PA-C  ipratropium-albuterol (DUONEB) 0.5-2.5 (3) MG/3ML SOLN Take 3 mLs by nebulization every 6 (six) hours as needed. 03/27/21  Yes Cassell Voorhies, Linden Dolin, PA-C  predniSONE (STERAPRED UNI-PAK 21 TAB) 10 MG (21) TBPK tablet Take 6 pills on day one then decrease by 1 pill each day 03/27/21  Yes Oddie Bottger, Linden Dolin, PA-C  acetaminophen (TYLENOL) 325 MG tablet Take 1-2 tablets (325-650 mg total) by mouth every 4 (four) hours as needed for mild pain (or temp >/= 101 F). 12/30/16   Johnson, Megan P, DO  albuterol (PROVENTIL) (2.5 MG/3ML) 0.083% nebulizer solution Take 3 mLs (2.5 mg total) by nebulization every 2 (two) hours as needed for wheezing or shortness of breath.  05/19/16   Jani Gravel, MD  aspirin 325 MG EC tablet Take 1 tablet (325 mg total) by mouth daily. 07/05/16   Johnson, Megan P, DO  atorvastatin (LIPITOR) 40 MG tablet TAKE 1 TABLET DAILY AT 6 PM. 11/08/16   Park Liter P, DO  clopidogrel (PLAVIX) 75 MG tablet Take 1 tablet (75 mg total) by mouth daily with breakfast. 07/05/16   Wynetta Emery, Megan P, DO  donepezil (ARICEPT) 10 MG tablet Take 1 tablet (10 mg total) by mouth at bedtime.  07/05/16   Johnson, Megan P, DO  lisinopril (PRINIVIL,ZESTRIL) 40 MG tablet Take 1 tablet (40 mg total) by mouth daily. 11/22/16   Volney American, PA-C  memantine (NAMENDA) 5 MG tablet Take 1 tablet (5 mg total) by mouth 2 (two) times daily. 07/05/16   Johnson, Megan P, DO  metoprolol tartrate (LOPRESSOR) 25 MG tablet Take 0.5 tablets (12.5 mg total) by mouth 2 (two) times daily. 07/05/16   Johnson, Megan P, DO  Nutritional Supplements (ENSURE NUTRITION SHAKE) LIQD Take 8 oz by mouth 2 (two) times daily. 05/11/15   Johnson, Megan P, DO  umeclidinium-vilanterol (ANORO ELLIPTA) 62.5-25 MCG/INH AEPB Inhale 1 puff into the lungs daily. 11/08/16   Valerie Roys, DO    Allergies Patient has no known allergies.  Family History  Problem Relation Age of Onset   Hypertension Mother    Hypertension Father    Heart disease Sister    Hypertension Brother     Social History Social History   Tobacco Use   Smoking status: Every Day    Packs/day: 0.75    Pack years: 0.00    Types: Cigarettes   Smokeless tobacco: Never  Substance Use Topics   Alcohol use: No    Alcohol/week: 0.0 standard drinks   Drug use: No    Review of Systems  Constitutional: No fever/chills Eyes: No visual changes. ENT: No sore throat. Respiratory: Denies cough, positive wheezing Cardiovascular: Denies chest pain Gastrointestinal: Denies abdominal pain Genitourinary: Negative for dysuria. Musculoskeletal: Negative for back pain. Skin: Negative for rash. Psychiatric: no mood changes,     ____________________________________________   PHYSICAL EXAM:  VITAL SIGNS: ED Triage Vitals  Enc Vitals Group     BP 03/27/21 1034 (!) 154/90     Pulse Rate 03/27/21 1034 64     Resp 03/27/21 1034 (!) 23     Temp 03/27/21 1034 98 F (36.7 C)     Temp Source 03/27/21 1034 Oral     SpO2 03/27/21 1033 100 %     Weight 03/27/21 1035 155 lb (70.3 kg)     Height 03/27/21 1035 5\' 3"  (1.6 m)     Head Circumference --       Peak Flow --      Pain Score 03/27/21 1035 0     Pain Loc --      Pain Edu? --      Excl. in [place]? --     Constitutional: Alert and oriented. Well appearing and in no acute distress. Eyes: Conjunctivae are normal.  Head: Atraumatic. Nose: No congestion/rhinnorhea. Mouth/Throat: Mucous membranes are moist.   Neck:  supple no lymphadenopathy noted Cardiovascular: Normal rate, regular rhythm. Heart sounds are normal Respiratory: Normal respiratory effort.  No retractions, lungs c wheezing bilaterally, decreased air movement Abd: soft nontender bs normal all 4 quad GU: deferred Musculoskeletal: FROM all extremities, warm and well perfused Neurologic:  Normal speech and language.  Skin:  Skin is warm, dry and intact.  No rash noted. Psychiatric: Mood and affect are normal. Speech and behavior are normal.  ____________________________________________   LABS (all labs ordered are listed, but only abnormal results are displayed)  Labs Reviewed  COMPREHENSIVE METABOLIC PANEL - Abnormal; Notable for the following components:      Result Value   Glucose, Bld 146 (*)    Creatinine, Ser 1.10 (*)    GFR, Estimated 50 (*)    All other components within normal limits  URINALYSIS, COMPLETE (UACMP) WITH MICROSCOPIC - Abnormal; Notable for the following components:   Color, Urine COLORLESS (*)    APPearance HAZY (*)    Bacteria, UA RARE (*)    All other components within normal limits  BRAIN NATRIURETIC PEPTIDE - Abnormal; Notable for the following components:   B Natriuretic Peptide 124.8 (*)    All other components within normal limits  RESP PANEL BY RT-PCR (FLU A&B, COVID) ARPGX2  CBC WITH DIFFERENTIAL/PLATELET   ____________________________________________   ____________________________________________  RADIOLOGY  Chest x-ray  ____________________________________________   PROCEDURES  Procedure(s) performed:  No  Procedures    ____________________________________________   INITIAL IMPRESSION / ASSESSMENT AND PLAN / ED COURSE  Pertinent labs & imaging results that were available during my care of the patient were reviewed by me and considered in my medical decision making (see chart for details).   The patient is a 86 year old female presents with wheezing and difficulty breathing.  See HPI.  Physical exam shows patient were stable at this time.  DDx: COPD exasperation, CHF, CAP, COVID  CBC and metabolic panel are normal for patient's trend.  BNP is elevated at 128.8, urinalysis normal, respiratory panel is negative  Chest x-ray  DuoNeb x3 given patient does not have increased respiratory effort, lungs have good air movement after DuoNebs  Patient was given Decadron 10 mg IV  Patient will be discharged with a DuoNeb nebulizer solution along with steroid taper, and Zithromax      Jasmine Arias was evaluated in Emergency Department on 03/27/2021 for the symptoms described in the history of present illness. She was evaluated in the context of the global COVID-19 pandemic, which necessitated consideration that the patient might be at risk for infection with the SARS-CoV-2 virus that causes COVID-19. Institutional protocols and algorithms that pertain to the evaluation of patients at risk for COVID-19 are in a state of rapid change based on information released by regulatory bodies including the CDC and federal and state organizations. These policies and algorithms were followed during the patient's care in the ED.    As part of my medical decision making, I reviewed the following data within the [place] notes reviewed and incorporated, Labs reviewed ,  Old chart reviewed, Radiograph reviewed , Notes from prior ED visits, and [place] Controlled Substance Database  ____________________________________________   FINAL CLINICAL IMPRESSION(S) / ED DIAGNOSES  Final  diagnoses:  COPD exacerbation ([place])      NEW MEDICATIONS STARTED DURING THIS VISIT:  New Prescriptions   AZITHROMYCIN (ZITHROMAX Z-PAK) 250 MG TABLET    2 pills today then 1 pill a day for 4 days   IPRATROPIUM-ALBUTEROL (DUONEB) 0.5-2.5 (3) MG/3ML SOLN    Take 3 mLs by nebulization every 6 (six) hours as needed.   PREDNISONE (STERAPRED UNI-PAK 21 TAB) 10 MG (21) TBPK TABLET    Take 6 pills on day one then decrease by 1 pill each day     Note:  This document was prepared using Systems analyst  and may include unintentional dictation errors.    Versie Starks, PA-C 03/27/21 1538    Carrie Mew, MD 03/29/21 [phone]

## 2021-03-27 NOTE — ED Notes (Signed)
Report given to Claudette at [facility].

## 2021-03-28 DIAGNOSIS — G309 Alzheimer's disease, unspecified: Secondary | ICD-10-CM | POA: Diagnosis not present

## 2021-03-28 DIAGNOSIS — Z794 Long term (current) use of insulin: Secondary | ICD-10-CM | POA: Diagnosis not present

## 2021-03-28 DIAGNOSIS — I69311 Memory deficit following cerebral infarction: Secondary | ICD-10-CM | POA: Diagnosis not present

## 2021-03-28 DIAGNOSIS — R600 Localized edema: Secondary | ICD-10-CM | POA: Diagnosis not present

## 2021-03-28 DIAGNOSIS — I10 Essential (primary) hypertension: Secondary | ICD-10-CM | POA: Diagnosis not present

## 2021-03-28 DIAGNOSIS — J449 Chronic obstructive pulmonary disease, unspecified: Secondary | ICD-10-CM | POA: Diagnosis not present

## 2021-03-28 DIAGNOSIS — E11319 Type 2 diabetes mellitus with unspecified diabetic retinopathy without macular edema: Secondary | ICD-10-CM | POA: Diagnosis not present

## 2021-03-29 DIAGNOSIS — J449 Chronic obstructive pulmonary disease, unspecified: Secondary | ICD-10-CM | POA: Diagnosis not present

## 2021-03-30 DIAGNOSIS — E11319 Type 2 diabetes mellitus with unspecified diabetic retinopathy without macular edema: Secondary | ICD-10-CM | POA: Diagnosis not present

## 2021-03-30 DIAGNOSIS — G309 Alzheimer's disease, unspecified: Secondary | ICD-10-CM | POA: Diagnosis not present

## 2021-03-30 DIAGNOSIS — Z794 Long term (current) use of insulin: Secondary | ICD-10-CM | POA: Diagnosis not present

## 2021-03-30 DIAGNOSIS — J449 Chronic obstructive pulmonary disease, unspecified: Secondary | ICD-10-CM | POA: Diagnosis not present

## 2021-03-30 DIAGNOSIS — I10 Essential (primary) hypertension: Secondary | ICD-10-CM | POA: Diagnosis not present

## 2021-03-30 DIAGNOSIS — R600 Localized edema: Secondary | ICD-10-CM | POA: Diagnosis not present

## 2021-03-30 DIAGNOSIS — I69311 Memory deficit following cerebral infarction: Secondary | ICD-10-CM | POA: Diagnosis not present

## 2021-04-04 DIAGNOSIS — I10 Essential (primary) hypertension: Secondary | ICD-10-CM | POA: Diagnosis not present

## 2021-04-04 DIAGNOSIS — I69311 Memory deficit following cerebral infarction: Secondary | ICD-10-CM | POA: Diagnosis not present

## 2021-04-04 DIAGNOSIS — J449 Chronic obstructive pulmonary disease, unspecified: Secondary | ICD-10-CM | POA: Diagnosis not present

## 2021-04-04 DIAGNOSIS — R600 Localized edema: Secondary | ICD-10-CM | POA: Diagnosis not present

## 2021-04-04 DIAGNOSIS — Z794 Long term (current) use of insulin: Secondary | ICD-10-CM | POA: Diagnosis not present

## 2021-04-04 DIAGNOSIS — E11319 Type 2 diabetes mellitus with unspecified diabetic retinopathy without macular edema: Secondary | ICD-10-CM | POA: Diagnosis not present

## 2021-04-04 DIAGNOSIS — G309 Alzheimer's disease, unspecified: Secondary | ICD-10-CM | POA: Diagnosis not present

## 2021-04-06 DIAGNOSIS — I69311 Memory deficit following cerebral infarction: Secondary | ICD-10-CM | POA: Diagnosis not present

## 2021-04-06 DIAGNOSIS — R600 Localized edema: Secondary | ICD-10-CM | POA: Diagnosis not present

## 2021-04-06 DIAGNOSIS — Z794 Long term (current) use of insulin: Secondary | ICD-10-CM | POA: Diagnosis not present

## 2021-04-06 DIAGNOSIS — I10 Essential (primary) hypertension: Secondary | ICD-10-CM | POA: Diagnosis not present

## 2021-04-06 DIAGNOSIS — E11319 Type 2 diabetes mellitus with unspecified diabetic retinopathy without macular edema: Secondary | ICD-10-CM | POA: Diagnosis not present

## 2021-04-06 DIAGNOSIS — J449 Chronic obstructive pulmonary disease, unspecified: Secondary | ICD-10-CM | POA: Diagnosis not present

## 2021-04-06 DIAGNOSIS — G309 Alzheimer's disease, unspecified: Secondary | ICD-10-CM | POA: Diagnosis not present

## 2021-04-09 DIAGNOSIS — E559 Vitamin D deficiency, unspecified: Secondary | ICD-10-CM | POA: Diagnosis not present

## 2021-04-09 DIAGNOSIS — E11319 Type 2 diabetes mellitus with unspecified diabetic retinopathy without macular edema: Secondary | ICD-10-CM | POA: Diagnosis not present

## 2021-04-09 DIAGNOSIS — G309 Alzheimer's disease, unspecified: Secondary | ICD-10-CM | POA: Diagnosis not present

## 2021-04-09 DIAGNOSIS — E785 Hyperlipidemia, unspecified: Secondary | ICD-10-CM | POA: Diagnosis not present

## 2021-04-09 DIAGNOSIS — J449 Chronic obstructive pulmonary disease, unspecified: Secondary | ICD-10-CM | POA: Diagnosis not present

## 2021-04-09 DIAGNOSIS — E119 Type 2 diabetes mellitus without complications: Secondary | ICD-10-CM | POA: Diagnosis not present

## 2021-04-09 DIAGNOSIS — E038 Other specified hypothyroidism: Secondary | ICD-10-CM | POA: Diagnosis not present

## 2021-04-09 DIAGNOSIS — D518 Other vitamin B12 deficiency anemias: Secondary | ICD-10-CM | POA: Diagnosis not present

## 2021-04-09 DIAGNOSIS — I10 Essential (primary) hypertension: Secondary | ICD-10-CM | POA: Diagnosis not present

## 2021-04-10 DIAGNOSIS — I10 Essential (primary) hypertension: Secondary | ICD-10-CM | POA: Diagnosis not present

## 2021-04-10 DIAGNOSIS — Z794 Long term (current) use of insulin: Secondary | ICD-10-CM | POA: Diagnosis not present

## 2021-04-10 DIAGNOSIS — G309 Alzheimer's disease, unspecified: Secondary | ICD-10-CM | POA: Diagnosis not present

## 2021-04-10 DIAGNOSIS — I69311 Memory deficit following cerebral infarction: Secondary | ICD-10-CM | POA: Diagnosis not present

## 2021-04-10 DIAGNOSIS — E11319 Type 2 diabetes mellitus with unspecified diabetic retinopathy without macular edema: Secondary | ICD-10-CM | POA: Diagnosis not present

## 2021-04-10 DIAGNOSIS — K5900 Constipation, unspecified: Secondary | ICD-10-CM | POA: Diagnosis not present

## 2021-04-10 DIAGNOSIS — J449 Chronic obstructive pulmonary disease, unspecified: Secondary | ICD-10-CM | POA: Diagnosis not present

## 2021-04-10 DIAGNOSIS — E559 Vitamin D deficiency, unspecified: Secondary | ICD-10-CM | POA: Diagnosis not present

## 2021-04-10 DIAGNOSIS — R600 Localized edema: Secondary | ICD-10-CM | POA: Diagnosis not present

## 2021-04-10 DIAGNOSIS — E785 Hyperlipidemia, unspecified: Secondary | ICD-10-CM | POA: Diagnosis not present

## 2021-04-12 DIAGNOSIS — E119 Type 2 diabetes mellitus without complications: Secondary | ICD-10-CM | POA: Diagnosis not present

## 2021-04-12 DIAGNOSIS — Z79899 Other long term (current) drug therapy: Secondary | ICD-10-CM | POA: Diagnosis not present

## 2021-04-12 DIAGNOSIS — D518 Other vitamin B12 deficiency anemias: Secondary | ICD-10-CM | POA: Diagnosis not present

## 2021-04-12 DIAGNOSIS — E7849 Other hyperlipidemia: Secondary | ICD-10-CM | POA: Diagnosis not present

## 2021-04-18 DIAGNOSIS — G309 Alzheimer's disease, unspecified: Secondary | ICD-10-CM | POA: Diagnosis not present

## 2021-04-18 DIAGNOSIS — Z794 Long term (current) use of insulin: Secondary | ICD-10-CM | POA: Diagnosis not present

## 2021-04-18 DIAGNOSIS — I10 Essential (primary) hypertension: Secondary | ICD-10-CM | POA: Diagnosis not present

## 2021-04-18 DIAGNOSIS — R600 Localized edema: Secondary | ICD-10-CM | POA: Diagnosis not present

## 2021-04-18 DIAGNOSIS — I69311 Memory deficit following cerebral infarction: Secondary | ICD-10-CM | POA: Diagnosis not present

## 2021-04-18 DIAGNOSIS — J449 Chronic obstructive pulmonary disease, unspecified: Secondary | ICD-10-CM | POA: Diagnosis not present

## 2021-04-18 DIAGNOSIS — E11319 Type 2 diabetes mellitus with unspecified diabetic retinopathy without macular edema: Secondary | ICD-10-CM | POA: Diagnosis not present

## 2021-04-25 DIAGNOSIS — E11319 Type 2 diabetes mellitus with unspecified diabetic retinopathy without macular edema: Secondary | ICD-10-CM | POA: Diagnosis not present

## 2021-04-25 DIAGNOSIS — R600 Localized edema: Secondary | ICD-10-CM | POA: Diagnosis not present

## 2021-04-25 DIAGNOSIS — G309 Alzheimer's disease, unspecified: Secondary | ICD-10-CM | POA: Diagnosis not present

## 2021-04-25 DIAGNOSIS — I10 Essential (primary) hypertension: Secondary | ICD-10-CM | POA: Diagnosis not present

## 2021-04-25 DIAGNOSIS — Z794 Long term (current) use of insulin: Secondary | ICD-10-CM | POA: Diagnosis not present

## 2021-04-25 DIAGNOSIS — I69311 Memory deficit following cerebral infarction: Secondary | ICD-10-CM | POA: Diagnosis not present

## 2021-04-25 DIAGNOSIS — E119 Type 2 diabetes mellitus without complications: Secondary | ICD-10-CM | POA: Diagnosis not present

## 2021-04-25 DIAGNOSIS — J449 Chronic obstructive pulmonary disease, unspecified: Secondary | ICD-10-CM | POA: Diagnosis not present

## 2021-04-26 DIAGNOSIS — J449 Chronic obstructive pulmonary disease, unspecified: Secondary | ICD-10-CM | POA: Diagnosis not present

## 2021-04-27 DIAGNOSIS — J449 Chronic obstructive pulmonary disease, unspecified: Secondary | ICD-10-CM | POA: Diagnosis not present

## 2021-04-27 DIAGNOSIS — G309 Alzheimer's disease, unspecified: Secondary | ICD-10-CM | POA: Diagnosis not present

## 2021-04-27 DIAGNOSIS — R600 Localized edema: Secondary | ICD-10-CM | POA: Diagnosis not present

## 2021-04-27 DIAGNOSIS — Z794 Long term (current) use of insulin: Secondary | ICD-10-CM | POA: Diagnosis not present

## 2021-04-27 DIAGNOSIS — E11319 Type 2 diabetes mellitus with unspecified diabetic retinopathy without macular edema: Secondary | ICD-10-CM | POA: Diagnosis not present

## 2021-04-27 DIAGNOSIS — I10 Essential (primary) hypertension: Secondary | ICD-10-CM | POA: Diagnosis not present

## 2021-04-27 DIAGNOSIS — I69311 Memory deficit following cerebral infarction: Secondary | ICD-10-CM | POA: Diagnosis not present

## 2021-04-27 DIAGNOSIS — R8290 Unspecified abnormal findings in urine: Secondary | ICD-10-CM | POA: Diagnosis not present

## 2021-04-30 DIAGNOSIS — J449 Chronic obstructive pulmonary disease, unspecified: Secondary | ICD-10-CM | POA: Diagnosis not present

## 2021-04-30 DIAGNOSIS — G309 Alzheimer's disease, unspecified: Secondary | ICD-10-CM | POA: Diagnosis not present

## 2021-04-30 DIAGNOSIS — R600 Localized edema: Secondary | ICD-10-CM | POA: Diagnosis not present

## 2021-04-30 DIAGNOSIS — E11319 Type 2 diabetes mellitus with unspecified diabetic retinopathy without macular edema: Secondary | ICD-10-CM | POA: Diagnosis not present

## 2021-04-30 DIAGNOSIS — I69311 Memory deficit following cerebral infarction: Secondary | ICD-10-CM | POA: Diagnosis not present

## 2021-04-30 DIAGNOSIS — I10 Essential (primary) hypertension: Secondary | ICD-10-CM | POA: Diagnosis not present

## 2021-04-30 DIAGNOSIS — Z794 Long term (current) use of insulin: Secondary | ICD-10-CM | POA: Diagnosis not present

## 2021-05-02 DIAGNOSIS — E785 Hyperlipidemia, unspecified: Secondary | ICD-10-CM | POA: Diagnosis not present

## 2021-05-02 DIAGNOSIS — E559 Vitamin D deficiency, unspecified: Secondary | ICD-10-CM | POA: Diagnosis not present

## 2021-05-02 DIAGNOSIS — E119 Type 2 diabetes mellitus without complications: Secondary | ICD-10-CM | POA: Diagnosis not present

## 2021-05-02 DIAGNOSIS — E11319 Type 2 diabetes mellitus with unspecified diabetic retinopathy without macular edema: Secondary | ICD-10-CM | POA: Diagnosis not present

## 2021-05-02 DIAGNOSIS — G309 Alzheimer's disease, unspecified: Secondary | ICD-10-CM | POA: Diagnosis not present

## 2021-05-02 DIAGNOSIS — J449 Chronic obstructive pulmonary disease, unspecified: Secondary | ICD-10-CM | POA: Diagnosis not present

## 2021-05-02 DIAGNOSIS — I10 Essential (primary) hypertension: Secondary | ICD-10-CM | POA: Diagnosis not present

## 2021-05-02 DIAGNOSIS — D518 Other vitamin B12 deficiency anemias: Secondary | ICD-10-CM | POA: Diagnosis not present

## 2021-05-02 DIAGNOSIS — E038 Other specified hypothyroidism: Secondary | ICD-10-CM | POA: Diagnosis not present

## 2021-05-07 DIAGNOSIS — J449 Chronic obstructive pulmonary disease, unspecified: Secondary | ICD-10-CM | POA: Diagnosis not present

## 2021-05-07 DIAGNOSIS — I69311 Memory deficit following cerebral infarction: Secondary | ICD-10-CM | POA: Diagnosis not present

## 2021-05-07 DIAGNOSIS — G309 Alzheimer's disease, unspecified: Secondary | ICD-10-CM | POA: Diagnosis not present

## 2021-05-07 DIAGNOSIS — Z794 Long term (current) use of insulin: Secondary | ICD-10-CM | POA: Diagnosis not present

## 2021-05-07 DIAGNOSIS — E11319 Type 2 diabetes mellitus with unspecified diabetic retinopathy without macular edema: Secondary | ICD-10-CM | POA: Diagnosis not present

## 2021-05-07 DIAGNOSIS — R600 Localized edema: Secondary | ICD-10-CM | POA: Diagnosis not present

## 2021-05-07 DIAGNOSIS — I10 Essential (primary) hypertension: Secondary | ICD-10-CM | POA: Diagnosis not present

## 2021-05-08 DIAGNOSIS — J449 Chronic obstructive pulmonary disease, unspecified: Secondary | ICD-10-CM | POA: Diagnosis not present

## 2021-05-08 DIAGNOSIS — I10 Essential (primary) hypertension: Secondary | ICD-10-CM | POA: Diagnosis not present

## 2021-05-08 DIAGNOSIS — E785 Hyperlipidemia, unspecified: Secondary | ICD-10-CM | POA: Diagnosis not present

## 2021-05-08 DIAGNOSIS — E559 Vitamin D deficiency, unspecified: Secondary | ICD-10-CM | POA: Diagnosis not present

## 2021-05-08 DIAGNOSIS — N390 Urinary tract infection, site not specified: Secondary | ICD-10-CM | POA: Diagnosis not present

## 2021-05-08 DIAGNOSIS — K5900 Constipation, unspecified: Secondary | ICD-10-CM | POA: Diagnosis not present

## 2021-05-08 DIAGNOSIS — R600 Localized edema: Secondary | ICD-10-CM | POA: Diagnosis not present

## 2021-05-14 DIAGNOSIS — G309 Alzheimer's disease, unspecified: Secondary | ICD-10-CM | POA: Diagnosis not present

## 2021-05-14 DIAGNOSIS — Z794 Long term (current) use of insulin: Secondary | ICD-10-CM | POA: Diagnosis not present

## 2021-05-14 DIAGNOSIS — J449 Chronic obstructive pulmonary disease, unspecified: Secondary | ICD-10-CM | POA: Diagnosis not present

## 2021-05-14 DIAGNOSIS — I69311 Memory deficit following cerebral infarction: Secondary | ICD-10-CM | POA: Diagnosis not present

## 2021-05-14 DIAGNOSIS — I10 Essential (primary) hypertension: Secondary | ICD-10-CM | POA: Diagnosis not present

## 2021-05-14 DIAGNOSIS — E11319 Type 2 diabetes mellitus with unspecified diabetic retinopathy without macular edema: Secondary | ICD-10-CM | POA: Diagnosis not present

## 2021-05-14 DIAGNOSIS — R600 Localized edema: Secondary | ICD-10-CM | POA: Diagnosis not present

## 2021-05-18 ENCOUNTER — Non-Acute Institutional Stay: Payer: Medicare Other | Admitting: Primary Care

## 2021-05-18 ENCOUNTER — Other Ambulatory Visit: Payer: Self-pay

## 2021-05-18 DIAGNOSIS — F0280 Dementia in other diseases classified elsewhere without behavioral disturbance: Secondary | ICD-10-CM

## 2021-05-18 DIAGNOSIS — F0150 Vascular dementia without behavioral disturbance: Secondary | ICD-10-CM

## 2021-05-18 DIAGNOSIS — I6930 Unspecified sequelae of cerebral infarction: Secondary | ICD-10-CM

## 2021-05-18 DIAGNOSIS — Z515 Encounter for palliative care: Secondary | ICD-10-CM | POA: Diagnosis not present

## 2021-05-18 DIAGNOSIS — G309 Alzheimer's disease, unspecified: Secondary | ICD-10-CM

## 2021-05-18 NOTE — Progress Notes (Addendum)
[facility] Consult Note Telephone: [phone]  Fax: [phone]   Date of encounter: 05/18/21 12:19 PM PATIENT NAME: Jasmine Arias [address] [address]   [id number] (home)  DOB: 1934/11/11 MRN: [id number] PRIMARY CARE PROVIDER:    Louretta Shorten,  [place] [address] [phone]  REFERRING PROVIDER:   Verl Blalock, NP [address],  [place] [address] [phone]    RESPONSIBLE PARTY:    Contact Information     Name Relation Home Work [place] Spouse [phone]  [phone]   Marquis Lunch [phone]         I met face to face with [facility] in [facility]. Palliative Care was asked to follow this patient by consultation request of  Verl Blalock, NP  to address advance care planning and complex medical decision making. This is the initial visit.                                     ASSESSMENT AND PLAN / RECOMMENDATIONS:   Advance Care Planning/Goals of Care: Goals include to maximize quality of life and symptom management. Patient unable to answer and family unreachable. Numbers on file are not working.  Will continue to find spokesperson for ACP discussion CODE STATUS: FULL CODE  Symptom Management/Plan:  I met with Ms Hesse in her room. She was under several covers and endorses being cold. She was dozing on and off during my interview.Staff report her glucose levels have been 70's-low 200's.  Weight is stable at 144 lbs per ALF record.  They state too she's refusing meds and some treatments. I am seeing her today as she establishes with palliative care.   Follow up Palliative Care Visit: Palliative care will continue to follow for complex medical decision making, advance care planning, and clarification of goals. Return 2-4 weeks or prn.  I spent 20 minutes providing this consultation. More than 50% of  the time in this consultation was spent in counseling and care coordination.  PPS: 30%  HOSPICE ELIGIBILITY/DIAGNOSIS: TBD  Chief Complaint: advance care planning needs  HISTORY OF PRESENT ILLNESS:  Jasmine Arias is a 86 y.o. year old female  with vascular dementia, CVA, DM, COPD, tobacco use history. Establishing today with Palliative medicine.  History obtained from review of EMR, discussion with primary team, and interview with family, facility staff/caregiver and/or Ms. Bradley Ferris.  I reviewed available labs, medications, imaging, studies and related documents from the EMR.  Records reviewed and summarized above.   ROS/staff  General: NAD ENMT: denies dysphagia Cardiovascular: denies chest pain, denies DOE Pulmonary: denies cough, endorses mild increased SOB Abdomen: endorses good appetite, denies constipation, endorses  occ incontinence of bowel GU: denies dysuria, endorses occ  incontinence of urine MSK:  endorses  weakness,  no falls reported Skin: denies rashes or wounds Neurological: denies pain, denies insomnia Psych: Endorses resistive  mood Heme/lymph/immuno: denies bruises, abnormal bleeding  Physical Exam: Current and past weights: 144 lbs Constitutional: NAD General: frail appearing, thin EYES: anicteric sclera, lids intact, no discharge  ENMT: intact hearing, oral mucous membranes moist CV: S1S2, RRR, no LE edema Pulmonary: LCTA, no increased work of breathing, no cough, room air,neb Tx prn Abdomen: intake 75%, normo-active BS + 4 quadrants, soft and non tender, no ascites GU: deferred MSK:  mod sarcopenia, moves all extremities, ambulatory with device Skin: warm and dry, no rashes or wounds on visible skin Neuro:  + generalized weakness,  severe cognitive impairment Psych: non-anxious affect, A and O x 1 Hem/lymph/immuno: no widespread bruising CURRENT PROBLEM LIST:  Patient Active Problem List   Diagnosis Date Noted   Parotid mass 07/05/2016   Right leg  weakness 05/15/2016   H/O endarterectomy    COPD (chronic obstructive pulmonary disease) ([place]) 02/27/2016   Iron deficiency anemia 09/14/2015   History of CVA with residual deficit 09/08/2015   Trigeminy 09/08/2015   Carotid stenosis, symptomatic, with infarction ([place]) 08/11/2015   Mixed Alzheimer's and vascular dementia ([place]) 07/10/2015   B12 deficiency 07/10/2015   Malnutrition ([place]) 05/11/2015   Type 2 diabetes mellitus with background retinopathy ([place]) 05/11/2015   HLD (hyperlipidemia) 05/11/2015   Essential hypertension 05/11/2015   Multiple thyroid nodules 05/11/2015   Aortic atherosclerosis ([place]) 05/11/2015   Polycythemia 02/12/2014   Hyperthyroidism, subclinical 02/12/2014   Cardiac murmur 02/11/2014   Carotid artery bruit 02/11/2014   Pulmonary nodule 06/10/2012   Compulsive tobacco user syndrome 06/09/2012   PAST MEDICAL HISTORY:  Active Ambulatory Problems    Diagnosis Date Noted   Malnutrition ([place]) 05/11/2015   Type 2 diabetes mellitus with background retinopathy ([place]) 05/11/2015   HLD (hyperlipidemia) 05/11/2015   Essential hypertension 05/11/2015   Multiple thyroid nodules 05/11/2015   Cardiac murmur 02/11/2014   Carotid artery bruit 02/11/2014   Polycythemia 02/12/2014   Pulmonary nodule 06/10/2012   Hyperthyroidism, subclinical 02/12/2014   Compulsive tobacco user syndrome 06/09/2012   Aortic atherosclerosis ([place]) 05/11/2015   Mixed Alzheimer's and vascular dementia ([place]) 07/10/2015   B12 deficiency 07/10/2015   Carotid stenosis, symptomatic, with infarction ([place]) 08/11/2015   History of CVA with residual deficit 09/08/2015   Trigeminy 09/08/2015   Iron deficiency anemia 09/14/2015   COPD (chronic obstructive pulmonary disease) ([place]) 02/27/2016   Right leg weakness 05/15/2016   H/O endarterectomy    Parotid mass 07/05/2016   Resolved Ambulatory Problems    Diagnosis Date Noted   Dementia ([place]) 05/11/2015   Vertigo 05/11/2015   Carotid artery  stenosis 05/11/2015   Fast heart beat 02/11/2014   Syncope 09/08/2015   Orthostatic hypotension 09/14/2015   Diabetes mellitus without complication (HCC)    History of stroke    Cerebrovascular accident (CVA) due to embolism of left anterior cerebral artery (HCC)    CVA (cerebral infarction) 05/16/2016   Past Medical History:  Diagnosis Date   Anemia    Anxiety    Hyperlipidemia    Hypertension    Indigestion    Memory changes    Stroke (cerebrum) ([place])    SOCIAL HX:  Social History   Tobacco Use   Smoking status: Every Day    Packs/day: 0.75    Types: Cigarettes   Smokeless tobacco: Never  Substance Use Topics   Alcohol use: No    Alcohol/week: 0.0 standard drinks   FAMILY HX:  Family History  Problem Relation Age of Onset   Hypertension Mother    Hypertension Father    Heart disease Sister    Hypertension Brother       ALLERGIES: No Known Allergies   PERTINENT MEDICATIONS:  Outpatient Encounter Medications as of 05/18/2021  Medication Sig   aspirin EC 81 MG tablet Take 81 mg by mouth daily. Swallow whole.   atorvastatin (LIPITOR) 40 MG tablet TAKE 1 TABLET DAILY AT 6 PM.   Cholecalciferol (VITAMIN D3) 50 MCG (  2000 UT) TABS Take 1 tablet by mouth daily at 12 noon.   clopidogrel (PLAVIX) 75 MG tablet Take 1 tablet (75 mg total) by mouth daily with breakfast.   docusate sodium (COLACE) 100 MG capsule Take 100 mg by mouth daily.   donepezil (ARICEPT) 10 MG tablet Take 1 tablet (10 mg total) by mouth at bedtime.   furosemide (LASIX) 40 MG tablet Take 40 mg by mouth daily.   insulin glargine (LANTUS SOLOSTAR) 100 UNIT/ML Solostar Pen Inject 28 Units into the skin at bedtime.   ipratropium-albuterol (DUONEB) 0.5-2.5 (3) MG/3ML SOLN Take 3 mLs by nebulization every 6 (six) hours as needed. (Patient taking differently: Take 3 mLs by nebulization 3 (three) times daily.)   lisinopril (PRINIVIL,ZESTRIL) 40 MG tablet Take 1 tablet (40 mg total) by mouth daily.   memantine  (NAMENDA) 5 MG tablet Take 1 tablet (5 mg total) by mouth 2 (two) times daily. (Patient taking differently: Take 5 mg by mouth daily.)   metoprolol tartrate (LOPRESSOR) 25 MG tablet Take 0.5 tablets (12.5 mg total) by mouth 2 (two) times daily. (Patient taking differently: Take 37.5 mg by mouth 2 (two) times daily.)   Multiple Vitamins-Minerals (PRESERVISION AREDS 2+MULTI VIT PO) Take 1 tablet by mouth 2 (two) times daily with a meal.   senna (SENOKOT) 8.6 MG TABS tablet Take 2 tablets by mouth daily as needed for mild constipation.   [DISCONTINUED] Nutritional Supplements (ENSURE NUTRITION SHAKE) LIQD Take 8 oz by mouth 2 (two) times daily.   sertraline (ZOLOFT) 100 MG tablet Take 100 mg by mouth daily.   [DISCONTINUED] acetaminophen (TYLENOL) 325 MG tablet Take 1-2 tablets (325-650 mg total) by mouth every 4 (four) hours as needed for mild pain (or temp >/= 101 F).   [DISCONTINUED] albuterol (PROVENTIL) (2.5 MG/3ML) 0.083% nebulizer solution Take 3 mLs (2.5 mg total) by nebulization every 2 (two) hours as needed for wheezing or shortness of breath.   [DISCONTINUED] aspirin 325 MG EC tablet Take 1 tablet (325 mg total) by mouth daily.   [DISCONTINUED] azithromycin (ZITHROMAX Z-PAK) 250 MG tablet 2 pills today then 1 pill a day for 4 days   [DISCONTINUED] predniSONE (STERAPRED UNI-PAK 21 TAB) 10 MG (21) TBPK tablet Take 6 pills on day one then decrease by 1 pill each day   [DISCONTINUED] umeclidinium-vilanterol (ANORO ELLIPTA) 62.5-25 MCG/INH AEPB Inhale 1 puff into the lungs daily. (Patient not taking: Reported on 05/18/2021)   No facility-administered encounter medications on file as of 05/18/2021.    Thank you for the opportunity to participate in the care of Ms. Bradley Ferris.  The palliative care team will continue to follow. Please call our office at [phone] if we can be of additional assistance.   Jason Coop, NP , [facility]  COVID-19 PATIENT SCREENING TOOL Asked and negative response  unless otherwise noted:  Have you had symptoms of covid, tested positive or been in contact with someone with symptoms/positive test in the past 5-10 days?

## 2021-05-18 NOTE — Addendum Note (Signed)
Addended by: Cyndia Skeeters on: 05/18/2021 10:51 PM   Modules accepted: Orders

## 2021-05-21 DIAGNOSIS — Z794 Long term (current) use of insulin: Secondary | ICD-10-CM | POA: Diagnosis not present

## 2021-05-21 DIAGNOSIS — I10 Essential (primary) hypertension: Secondary | ICD-10-CM | POA: Diagnosis not present

## 2021-05-21 DIAGNOSIS — I69311 Memory deficit following cerebral infarction: Secondary | ICD-10-CM | POA: Diagnosis not present

## 2021-05-21 DIAGNOSIS — J449 Chronic obstructive pulmonary disease, unspecified: Secondary | ICD-10-CM | POA: Diagnosis not present

## 2021-05-21 DIAGNOSIS — E11319 Type 2 diabetes mellitus with unspecified diabetic retinopathy without macular edema: Secondary | ICD-10-CM | POA: Diagnosis not present

## 2021-05-21 DIAGNOSIS — N390 Urinary tract infection, site not specified: Secondary | ICD-10-CM | POA: Diagnosis not present

## 2021-05-21 DIAGNOSIS — R600 Localized edema: Secondary | ICD-10-CM | POA: Diagnosis not present

## 2021-05-21 DIAGNOSIS — G309 Alzheimer's disease, unspecified: Secondary | ICD-10-CM | POA: Diagnosis not present

## 2021-05-23 DIAGNOSIS — J449 Chronic obstructive pulmonary disease, unspecified: Secondary | ICD-10-CM | POA: Diagnosis not present

## 2021-05-24 DIAGNOSIS — J449 Chronic obstructive pulmonary disease, unspecified: Secondary | ICD-10-CM | POA: Diagnosis not present

## 2021-05-25 DIAGNOSIS — E1159 Type 2 diabetes mellitus with other circulatory complications: Secondary | ICD-10-CM | POA: Diagnosis not present

## 2021-05-25 DIAGNOSIS — B351 Tinea unguium: Secondary | ICD-10-CM | POA: Diagnosis not present

## 2021-05-26 DIAGNOSIS — E119 Type 2 diabetes mellitus without complications: Secondary | ICD-10-CM | POA: Diagnosis not present

## 2021-05-30 DIAGNOSIS — R600 Localized edema: Secondary | ICD-10-CM | POA: Diagnosis not present

## 2021-05-30 DIAGNOSIS — I69311 Memory deficit following cerebral infarction: Secondary | ICD-10-CM | POA: Diagnosis not present

## 2021-05-30 DIAGNOSIS — G309 Alzheimer's disease, unspecified: Secondary | ICD-10-CM | POA: Diagnosis not present

## 2021-05-30 DIAGNOSIS — E559 Vitamin D deficiency, unspecified: Secondary | ICD-10-CM | POA: Diagnosis not present

## 2021-05-30 DIAGNOSIS — N390 Urinary tract infection, site not specified: Secondary | ICD-10-CM | POA: Diagnosis not present

## 2021-05-30 DIAGNOSIS — Z79899 Other long term (current) drug therapy: Secondary | ICD-10-CM | POA: Diagnosis not present

## 2021-05-30 DIAGNOSIS — E119 Type 2 diabetes mellitus without complications: Secondary | ICD-10-CM | POA: Diagnosis not present

## 2021-05-30 DIAGNOSIS — D518 Other vitamin B12 deficiency anemias: Secondary | ICD-10-CM | POA: Diagnosis not present

## 2021-05-30 DIAGNOSIS — Z794 Long term (current) use of insulin: Secondary | ICD-10-CM | POA: Diagnosis not present

## 2021-05-30 DIAGNOSIS — E038 Other specified hypothyroidism: Secondary | ICD-10-CM | POA: Diagnosis not present

## 2021-05-30 DIAGNOSIS — I10 Essential (primary) hypertension: Secondary | ICD-10-CM | POA: Diagnosis not present

## 2021-05-30 DIAGNOSIS — E11319 Type 2 diabetes mellitus with unspecified diabetic retinopathy without macular edema: Secondary | ICD-10-CM | POA: Diagnosis not present

## 2021-05-30 DIAGNOSIS — J449 Chronic obstructive pulmonary disease, unspecified: Secondary | ICD-10-CM | POA: Diagnosis not present

## 2021-05-30 DIAGNOSIS — E7849 Other hyperlipidemia: Secondary | ICD-10-CM | POA: Diagnosis not present

## 2021-06-03 ENCOUNTER — Other Ambulatory Visit: Payer: Self-pay

## 2021-06-03 ENCOUNTER — Emergency Department
Admission: EM | Admit: 2021-06-03 | Discharge: 2021-06-03 | Disposition: A | Discharge status: Home / Self Care | Payer: Medicare Other | Attending: Emergency Medicine | Admitting: Emergency Medicine

## 2021-06-03 ENCOUNTER — Emergency Department: Payer: Medicare Other

## 2021-06-03 DIAGNOSIS — Z7902 Long term (current) use of antithrombotics/antiplatelets: Secondary | ICD-10-CM | POA: Diagnosis not present

## 2021-06-03 DIAGNOSIS — R059 Cough, unspecified: Secondary | ICD-10-CM | POA: Diagnosis not present

## 2021-06-03 DIAGNOSIS — Z7982 Long term (current) use of aspirin: Secondary | ICD-10-CM | POA: Diagnosis not present

## 2021-06-03 DIAGNOSIS — E119 Type 2 diabetes mellitus without complications: Secondary | ICD-10-CM | POA: Insufficient documentation

## 2021-06-03 DIAGNOSIS — Z79899 Other long term (current) drug therapy: Secondary | ICD-10-CM | POA: Diagnosis not present

## 2021-06-03 DIAGNOSIS — Z794 Long term (current) use of insulin: Secondary | ICD-10-CM | POA: Insufficient documentation

## 2021-06-03 DIAGNOSIS — F17210 Nicotine dependence, cigarettes, uncomplicated: Secondary | ICD-10-CM | POA: Insufficient documentation

## 2021-06-03 DIAGNOSIS — G3183 Dementia with Lewy bodies: Secondary | ICD-10-CM | POA: Diagnosis not present

## 2021-06-03 DIAGNOSIS — R0689 Other abnormalities of breathing: Secondary | ICD-10-CM | POA: Diagnosis not present

## 2021-06-03 DIAGNOSIS — I10 Essential (primary) hypertension: Secondary | ICD-10-CM | POA: Insufficient documentation

## 2021-06-03 DIAGNOSIS — R410 Disorientation, unspecified: Secondary | ICD-10-CM | POA: Insufficient documentation

## 2021-06-03 DIAGNOSIS — R404 Transient alteration of awareness: Secondary | ICD-10-CM | POA: Diagnosis not present

## 2021-06-03 DIAGNOSIS — R6889 Other general symptoms and signs: Secondary | ICD-10-CM | POA: Diagnosis not present

## 2021-06-03 DIAGNOSIS — J441 Chronic obstructive pulmonary disease with (acute) exacerbation: Secondary | ICD-10-CM | POA: Insufficient documentation

## 2021-06-03 DIAGNOSIS — Z743 Need for continuous supervision: Secondary | ICD-10-CM | POA: Diagnosis not present

## 2021-06-03 DIAGNOSIS — Z20822 Contact with and (suspected) exposure to covid-19: Secondary | ICD-10-CM | POA: Diagnosis not present

## 2021-06-03 DIAGNOSIS — R0602 Shortness of breath: Secondary | ICD-10-CM | POA: Diagnosis not present

## 2021-06-03 DIAGNOSIS — R279 Unspecified lack of coordination: Secondary | ICD-10-CM | POA: Diagnosis not present

## 2021-06-03 DIAGNOSIS — R4182 Altered mental status, unspecified: Secondary | ICD-10-CM | POA: Diagnosis present

## 2021-06-03 LAB — CBC WITH DIFFERENTIAL/PLATELET
Abs Immature Granulocytes: 0.02 10*3/uL (ref 0.00–0.07)
Basophils Absolute: 0 10*3/uL (ref 0.0–0.1)
Basophils Relative: 1 %
Eosinophils Absolute: 0.2 10*3/uL (ref 0.0–0.5)
Eosinophils Relative: 3 %
HCT: 42.6 % (ref 36.0–46.0)
Hemoglobin: 13.2 g/dL (ref 12.0–15.0)
Immature Granulocytes: 0 %
Lymphocytes Relative: 14 %
Lymphs Abs: 1 10*3/uL (ref 0.7–4.0)
MCH: 29.4 pg (ref 26.0–34.0)
MCHC: 31 g/dL (ref 30.0–36.0)
MCV: 94.9 fL (ref 80.0–100.0)
Monocytes Absolute: 0.5 10*3/uL (ref 0.1–1.0)
Monocytes Relative: 6 %
Neutro Abs: 5.5 10*3/uL (ref 1.7–7.7)
Neutrophils Relative %: 76 %
Platelets: 176 10*3/uL (ref 150–400)
RBC: 4.49 MIL/uL (ref 3.87–5.11)
RDW: 13.8 % (ref 11.5–15.5)
WBC: 7.3 10*3/uL (ref 4.0–10.5)
nRBC: 0 % (ref 0.0–0.2)

## 2021-06-03 LAB — URINALYSIS, COMPLETE (UACMP) WITH MICROSCOPIC
Bilirubin Urine: NEGATIVE
Glucose, UA: NEGATIVE mg/dL
Hgb urine dipstick: NEGATIVE
Ketones, ur: NEGATIVE mg/dL
Leukocytes,Ua: NEGATIVE
Nitrite: NEGATIVE
Protein, ur: NEGATIVE mg/dL
Specific Gravity, Urine: 1.02 (ref 1.005–1.030)
pH: 5.5 (ref 5.0–8.0)

## 2021-06-03 LAB — COMPREHENSIVE METABOLIC PANEL
ALT: 15 U/L (ref 0–44)
AST: 18 U/L (ref 15–41)
Albumin: 3.5 g/dL (ref 3.5–5.0)
Alkaline Phosphatase: 87 U/L (ref 38–126)
Anion gap: 8 (ref 5–15)
BUN: 18 mg/dL (ref 8–23)
CO2: 36 mmol/L — ABNORMAL HIGH (ref 22–32)
Calcium: 9.3 mg/dL (ref 8.9–10.3)
Chloride: 104 mmol/L (ref 98–111)
Creatinine, Ser: 1.48 mg/dL — ABNORMAL HIGH (ref 0.44–1.00)
GFR, Estimated: 35 mL/min — ABNORMAL LOW (ref >60)
Glucose, Bld: 77 mg/dL (ref 70–99)
Potassium: 3.8 mmol/L (ref 3.5–5.1)
Sodium: 148 mmol/L — ABNORMAL HIGH (ref 135–145)
Total Bilirubin: 0.5 mg/dL (ref 0.3–1.2)
Total Protein: 7 g/dL (ref 6.5–8.1)

## 2021-06-03 LAB — BLOOD GAS, VENOUS
Acid-Base Excess: 11.3 mmol/L — ABNORMAL HIGH (ref 0.0–2.0)
Bicarbonate: 37.8 mmol/L — ABNORMAL HIGH (ref 20.0–28.0)
O2 Saturation: 79 %
Patient temperature: 37
pCO2, Ven: 57 mmHg (ref 44.0–60.0)
pH, Ven: 7.43 (ref 7.250–7.430)
pO2, Ven: 42 mmHg (ref 32.0–45.0)

## 2021-06-03 LAB — TROPONIN I (HIGH SENSITIVITY)
Troponin I (High Sensitivity): 10 ng/L (ref <18)
Troponin I (High Sensitivity): 8 ng/L (ref <18)

## 2021-06-03 LAB — RESP PANEL BY RT-PCR (FLU A&B, COVID) ARPGX2
Influenza A by PCR: NEGATIVE
Influenza B by PCR: NEGATIVE
SARS Coronavirus 2 by RT PCR: NEGATIVE

## 2021-06-03 MED ORDER — IPRATROPIUM-ALBUTEROL 0.5-2.5 (3) MG/3ML IN SOLN
3.0000 mL | Freq: Once | RESPIRATORY_TRACT | Status: AC
  Administered 2021-06-03: 3 mL via RESPIRATORY_TRACT
  Filled 2021-06-03: qty 3

## 2021-06-03 NOTE — ED Notes (Signed)
Called [facility] for transport to [place]

## 2021-06-03 NOTE — ED Notes (Signed)
Pt discharged to [place] with [facility].

## 2021-06-03 NOTE — ED Notes (Signed)
RN Maggie called facility [phone], [facility]). Nurse reports that 3rd shift informed oncoming shift that pt was not acting herself. RN noticed she was wheezing, confused and coughing. Reports that pt at baseline is alert to self and very quiet.

## 2021-06-03 NOTE — ED Triage Notes (Signed)
Pt arrives via [facility] from [facility]. EMS called by staff for "increased altered mental status." Pt has dementia at baseline. EMS reports that no paperwork was given for handoff, but staff did say that the pt is "full code."

## 2021-06-03 NOTE — ED Provider Notes (Signed)
[facility] Emergency Department Provider Note   ____________________________________________   Event Date/Time   First MD Initiated Contact with Patient 06/03/21 [phone]     (approximate)  I have reviewed the triage vital signs and the nursing notes.   HISTORY  Chief Complaint Altered Mental Status    HPI CHRITINA OSIER is a 86 y.o. female with past medical history of hypertension, hyperlipidemia, diabetes, COPD, stroke, and dementia who presents to the ED for altered mental status.  History is limited due to patient's baseline dementia.  Per EMS, patient noted to be more confused than her baseline by staff at [facility] assisted living.  Staff there were unable to describe patient's baseline mental status, but did report she has a history of dementia.  Patient denies any complaints on arrival, specifically denies any pain or difficulty breathing, but is actively coughing on my assessment.        Past Medical History:  Diagnosis Date   Anemia    Anxiety    Cerebrovascular accident (CVA) due to embolism of left anterior cerebral artery (HCC)    COPD (chronic obstructive pulmonary disease) ([place])    Dementia ([place])    Diabetes mellitus without complication ([place])    Hyperlipidemia    Hypertension    Indigestion    Memory changes    Stroke (cerebrum) ([place])    Syncope 09/08/2015   Vertigo 05/11/2015    Patient Active Problem List   Diagnosis Date Noted   Parotid mass 07/05/2016   Right leg weakness 05/15/2016   H/O endarterectomy    COPD (chronic obstructive pulmonary disease) ([place]) 02/27/2016   Iron deficiency anemia 09/14/2015   History of CVA with residual deficit 09/08/2015   Trigeminy 09/08/2015   Carotid stenosis, symptomatic, with infarction ([place]) 08/11/2015   Mixed Alzheimer's and vascular dementia ([place]) 07/10/2015   B12 deficiency 07/10/2015   Malnutrition ([place]) 05/11/2015   Type 2 diabetes mellitus with background retinopathy ([place])  05/11/2015   HLD (hyperlipidemia) 05/11/2015   Essential hypertension 05/11/2015   Multiple thyroid nodules 05/11/2015   Aortic atherosclerosis ([place]) 05/11/2015   Polycythemia 02/12/2014   Hyperthyroidism, subclinical 02/12/2014   Cardiac murmur 02/11/2014   Carotid artery bruit 02/11/2014   Pulmonary nodule 06/10/2012   Compulsive tobacco user syndrome 06/09/2012    Past Surgical History:  Procedure Laterality Date   ABDOMINAL HYSTERECTOMY     heavy bleeding   CAROTID ARTERY - SUBCLAVIAN ARTERY BYPASS GRAFT     ENDARTERECTOMY Left 08/11/2015   Procedure: ENDARTERECTOMY CAROTID;  Surgeon: Katha Cabal, MD;  Location: [facility] ORS;  Service: Vascular;  Laterality: Left;   PERIPHERAL VASCULAR CATHETERIZATION Left 08/11/2015   Procedure: Carotid Angiography;  Surgeon: Katha Cabal, MD;  Location: [facility];  Service: Cardiovascular;  Laterality: Left;    Prior to Admission medications   Medication Sig Start Date End Date Taking? Authorizing Provider  aspirin EC 81 MG tablet Take 81 mg by mouth daily. Swallow whole.    [provider]  atorvastatin (LIPITOR) 40 MG tablet TAKE 1 TABLET DAILY AT 6 PM. 11/08/16   Park Liter P, DO  Cholecalciferol (VITAMIN D3) 50 MCG (2000 UT) TABS Take 1 tablet by mouth daily at 12 noon.    [provider]  clopidogrel (PLAVIX) 75 MG tablet Take 1 tablet (75 mg total) by mouth daily with breakfast. 07/05/16   Park Liter P, DO  docusate sodium (COLACE) 100 MG capsule Take 100 mg by mouth daily. 02/13/21  [provider]  donepezil (ARICEPT) 10 MG tablet Take 1 tablet (10 mg total) by mouth at bedtime. 07/05/16   Johnson, Megan P, DO  furosemide (LASIX) 40 MG tablet Take 40 mg by mouth daily. 05/16/21   [provider]  insulin glargine (LANTUS SOLOSTAR) 100 UNIT/ML Solostar Pen Inject 28 Units into the skin at bedtime.    [provider]  ipratropium-albuterol (DUONEB) 0.5-2.5 (3) MG/3ML SOLN  Take 3 mLs by nebulization every 6 (six) hours as needed. Patient taking differently: Take 3 mLs by nebulization 3 (three) times daily. 03/27/21   Fisher, Linden Dolin, PA-C  lisinopril (PRINIVIL,ZESTRIL) 40 MG tablet Take 1 tablet (40 mg total) by mouth daily. 11/22/16   Volney American, PA-C  memantine (NAMENDA) 5 MG tablet Take 1 tablet (5 mg total) by mouth 2 (two) times daily. Patient taking differently: Take 5 mg by mouth daily. 07/05/16   Johnson, Megan P, DO  metoprolol tartrate (LOPRESSOR) 25 MG tablet Take 0.5 tablets (12.5 mg total) by mouth 2 (two) times daily. Patient taking differently: Take 37.5 mg by mouth 2 (two) times daily. 07/05/16   Johnson, Megan P, DO  Multiple Vitamins-Minerals (PRESERVISION AREDS 2+MULTI VIT PO) Take 1 tablet by mouth 2 (two) times daily with a meal.    [provider]  senna (SENOKOT) 8.6 MG TABS tablet Take 2 tablets by mouth daily as needed for mild constipation.    [provider]  sertraline (ZOLOFT) 100 MG tablet Take 100 mg by mouth daily. 05/16/21   [provider]    Allergies Patient has no known allergies.  Family History  Problem Relation Age of Onset   Hypertension Mother    Hypertension Father    Heart disease Sister    Hypertension Brother     Social History Social History   Tobacco Use   Smoking status: Every Day    Packs/day: 0.75    Types: Cigarettes   Smokeless tobacco: Never  Substance Use Topics   Alcohol use: No    Alcohol/week: 0.0 standard drinks   Drug use: No    Review of Systems  Constitutional: No fever/chills.  Positive for confusion. Eyes: No visual changes. ENT: No sore throat. Cardiovascular: Denies chest pain. Respiratory: Denies shortness of breath.  Positive for cough. Gastrointestinal: No abdominal pain.  No nausea, no vomiting.  No diarrhea.  No constipation. Genitourinary: Negative for dysuria. Musculoskeletal: Negative for back pain. Skin: Negative for  rash. Neurological: Negative for headaches, focal weakness or numbness.  ____________________________________________   PHYSICAL EXAM:  VITAL SIGNS: ED Triage Vitals  Enc Vitals Group     BP 06/03/21 0917 (!) 142/65     Pulse Rate 06/03/21 0917 (!) 56     Resp 06/03/21 0917 (!) 27     Temp 06/03/21 0917 98.3 F (36.8 C)     Temp Source 06/03/21 0917 Oral     SpO2 06/03/21 0911 95 %     Weight --      Height --      Head Circumference --      Peak Flow --      Pain Score --      Pain Loc --      Pain Edu? --      Excl. in [place]? --     Constitutional: Alert and oriented to person and place, but not time. Eyes: Conjunctivae are normal. Head: Atraumatic. Nose: No congestion/rhinnorhea. Mouth/Throat: Mucous membranes are moist. Neck: Normal ROM Cardiovascular: Normal  rate, regular rhythm. Grossly normal heart sounds.  2+ radial pulses bilaterally. Respiratory: Tachypneic with increased respiratory effort.  No retractions. Lungs with poor air movement and expiratory wheezing noted. Gastrointestinal: Soft and nontender. No distention. Genitourinary: deferred Musculoskeletal: No lower extremity tenderness nor edema. Neurologic:  Normal speech and language. No gross focal neurologic deficits are appreciated. Skin:  Skin is warm, dry and intact. No rash noted. Psychiatric: Mood and affect are normal. Speech and behavior are normal.  ____________________________________________   LABS (all labs ordered are listed, but only abnormal results are displayed)  Labs Reviewed  URINALYSIS, COMPLETE (UACMP) WITH MICROSCOPIC - Abnormal; Notable for the following components:      Result Value   Bacteria, UA RARE (*)    All other components within normal limits  BLOOD GAS, VENOUS - Abnormal; Notable for the following components:   Bicarbonate 37.8 (*)    Acid-Base Excess 11.3 (*)    All other components within normal limits  COMPREHENSIVE METABOLIC PANEL - Abnormal; Notable for the  following components:   Sodium 148 (*)    CO2 36 (*)    Creatinine, Ser 1.48 (*)    GFR, Estimated 35 (*)    All other components within normal limits  RESP PANEL BY RT-PCR (FLU A&B, COVID) ARPGX2  CBC WITH DIFFERENTIAL/PLATELET  TROPONIN I (HIGH SENSITIVITY)  TROPONIN I (HIGH SENSITIVITY)   ____________________________________________  EKG  ED ECG REPORT I, Blake Divine, the attending physician, personally viewed and interpreted this ECG.   Date: 06/03/2021  EKG Time: 9:13  Rate: 60  Rhythm: normal sinus rhythm  Axis: Normal  Intervals:none  ST&T Change: None   PROCEDURES  Procedure(s) performed (including Critical Care):  Procedures   ____________________________________________   INITIAL IMPRESSION / ASSESSMENT AND PLAN / ED COURSE      86 year old female with past medical history of hypertension, hyperlipidemia, diabetes, COPD, stroke, and dementia who presents to the ED due to concern for altered mental status per staff at her nursing facility.  Patient is awake and alert, oriented to person and place, he denies any complaints on my assessment.  Despite this, she is actively coughing and appears tachypneic with increased respiratory effort.  Wheezing noted on lung auscultation and we will treat with DuoNeb, check VBG to ensure no hypercapnia.  She does not have any focal neurologic deficits, but given limited history we will further assess with CT head.  Labs and UA are pending.  VBG shows no significant CO2 retention, patient breathing much more comfortably following DuoNeb.  CT head is negative for acute process, chest x-ray reviewed by me and shows no infiltrate, edema, or effusion.  UA shows no signs of infection and additional labs are unremarkable.  In further discussion with staff at patient's nursing facility, she is reportedly oriented only to person at baseline, similar to how she is presenting today.  She does appear to have a mild COPD exacerbation but  given this has improved following DuoNeb, we will hold off on steroids for now.  Given reassuring work-up, she is appropriate for discharge back to nursing facility.      ____________________________________________   FINAL CLINICAL IMPRESSION(S) / ED DIAGNOSES  Final diagnoses:  Confusion  Cough  COPD exacerbation [facility])     ED Discharge Orders     None        Note:  This document was prepared using Dragon voice recognition software and may include unintentional dictation errors.    Blake Divine, MD 06/03/21 [phone]

## 2021-06-03 NOTE — ED Notes (Signed)
Patient transported to CT 

## 2021-06-05 DIAGNOSIS — J449 Chronic obstructive pulmonary disease, unspecified: Secondary | ICD-10-CM | POA: Diagnosis not present

## 2021-06-05 DIAGNOSIS — R600 Localized edema: Secondary | ICD-10-CM | POA: Diagnosis not present

## 2021-06-05 DIAGNOSIS — N390 Urinary tract infection, site not specified: Secondary | ICD-10-CM | POA: Diagnosis not present

## 2021-06-05 DIAGNOSIS — E559 Vitamin D deficiency, unspecified: Secondary | ICD-10-CM | POA: Diagnosis not present

## 2021-06-05 DIAGNOSIS — K5900 Constipation, unspecified: Secondary | ICD-10-CM | POA: Diagnosis not present

## 2021-06-05 DIAGNOSIS — E785 Hyperlipidemia, unspecified: Secondary | ICD-10-CM | POA: Diagnosis not present

## 2021-06-05 DIAGNOSIS — I10 Essential (primary) hypertension: Secondary | ICD-10-CM | POA: Diagnosis not present

## 2021-06-05 DIAGNOSIS — E11319 Type 2 diabetes mellitus with unspecified diabetic retinopathy without macular edema: Secondary | ICD-10-CM | POA: Diagnosis not present

## 2021-06-06 DIAGNOSIS — E11319 Type 2 diabetes mellitus with unspecified diabetic retinopathy without macular edema: Secondary | ICD-10-CM | POA: Diagnosis not present

## 2021-06-06 DIAGNOSIS — I69311 Memory deficit following cerebral infarction: Secondary | ICD-10-CM | POA: Diagnosis not present

## 2021-06-06 DIAGNOSIS — R600 Localized edema: Secondary | ICD-10-CM | POA: Diagnosis not present

## 2021-06-06 DIAGNOSIS — N390 Urinary tract infection, site not specified: Secondary | ICD-10-CM | POA: Diagnosis not present

## 2021-06-06 DIAGNOSIS — G309 Alzheimer's disease, unspecified: Secondary | ICD-10-CM | POA: Diagnosis not present

## 2021-06-06 DIAGNOSIS — J449 Chronic obstructive pulmonary disease, unspecified: Secondary | ICD-10-CM | POA: Diagnosis not present

## 2021-06-06 DIAGNOSIS — I10 Essential (primary) hypertension: Secondary | ICD-10-CM | POA: Diagnosis not present

## 2021-06-06 DIAGNOSIS — Z794 Long term (current) use of insulin: Secondary | ICD-10-CM | POA: Diagnosis not present

## 2021-06-26 DIAGNOSIS — E119 Type 2 diabetes mellitus without complications: Secondary | ICD-10-CM | POA: Diagnosis not present

## 2021-06-26 DIAGNOSIS — J449 Chronic obstructive pulmonary disease, unspecified: Secondary | ICD-10-CM | POA: Diagnosis not present

## 2021-07-26 DIAGNOSIS — E119 Type 2 diabetes mellitus without complications: Secondary | ICD-10-CM | POA: Diagnosis not present

## 2021-07-31 DEATH — deceased
